# Patient Record
Sex: Male | Born: 1945 | Race: White | Hispanic: No | Marital: Married | Smoking: Former smoker
Health system: Southern US, Community
[De-identification: ages and names within clinical notes are randomized; demographics above are authoritative.]

## PROBLEM LIST (undated history)

## (undated) DIAGNOSIS — G4730 Sleep apnea, unspecified: Secondary | ICD-10-CM

## (undated) DIAGNOSIS — R0600 Dyspnea, unspecified: Secondary | ICD-10-CM

## (undated) DIAGNOSIS — M5416 Radiculopathy, lumbar region: Secondary | ICD-10-CM

## (undated) DIAGNOSIS — Z9889 Other specified postprocedural states: Secondary | ICD-10-CM

## (undated) DIAGNOSIS — E785 Hyperlipidemia, unspecified: Secondary | ICD-10-CM

## (undated) DIAGNOSIS — I10 Essential (primary) hypertension: Secondary | ICD-10-CM

## (undated) DIAGNOSIS — K219 Gastro-esophageal reflux disease without esophagitis: Secondary | ICD-10-CM

## (undated) DIAGNOSIS — J449 Chronic obstructive pulmonary disease, unspecified: Secondary | ICD-10-CM

## (undated) DIAGNOSIS — M48061 Spinal stenosis, lumbar region without neurogenic claudication: Secondary | ICD-10-CM

## (undated) DIAGNOSIS — T8859XA Other complications of anesthesia, initial encounter: Secondary | ICD-10-CM

## (undated) DIAGNOSIS — N1831 Chronic kidney disease, stage 3a: Secondary | ICD-10-CM

## (undated) DIAGNOSIS — E119 Type 2 diabetes mellitus without complications: Secondary | ICD-10-CM

## (undated) DIAGNOSIS — H409 Unspecified glaucoma: Secondary | ICD-10-CM

## (undated) DIAGNOSIS — C4492 Squamous cell carcinoma of skin, unspecified: Secondary | ICD-10-CM

## (undated) DIAGNOSIS — J189 Pneumonia, unspecified organism: Secondary | ICD-10-CM

## (undated) DIAGNOSIS — N183 Chronic kidney disease, stage 3 unspecified: Secondary | ICD-10-CM

## (undated) DIAGNOSIS — G44009 Cluster headache syndrome, unspecified, not intractable: Secondary | ICD-10-CM

## (undated) DIAGNOSIS — E538 Deficiency of other specified B group vitamins: Secondary | ICD-10-CM

## (undated) HISTORY — PX: CATARACT EXTRACTION: SUR2

## (undated) HISTORY — PX: COLONOSCOPY: SHX174

## (undated) HISTORY — PX: NASAL DERMOID CYST EXCISION: SHX2063

## (undated) HISTORY — PX: PILONIDAL CYST EXCISION: SHX744

## (undated) HISTORY — PX: APPENDECTOMY: SHX54

## (undated) HISTORY — PX: NASAL SEPTUM SURGERY: SHX37

## (undated) HISTORY — PX: BRONCHOSCOPY: SUR163

## (undated) HISTORY — PX: LUMBAR LAMINECTOMY: SHX95

## (undated) HISTORY — PX: BACK SURGERY: SHX140

---

## 1965-05-29 HISTORY — PX: APPENDECTOMY: SHX54

## 1981-05-29 HISTORY — PX: PILONIDAL CYST EXCISION: SHX744

## 2019-05-30 HISTORY — PX: CATARACT EXTRACTION: SUR2

## 2021-08-03 ENCOUNTER — Other Ambulatory Visit: Payer: Self-pay | Admitting: Internal Medicine

## 2021-08-03 DIAGNOSIS — R911 Solitary pulmonary nodule: Secondary | ICD-10-CM

## 2021-08-03 DIAGNOSIS — J431 Panlobular emphysema: Secondary | ICD-10-CM

## 2021-08-03 DIAGNOSIS — N401 Enlarged prostate with lower urinary tract symptoms: Secondary | ICD-10-CM | POA: Insufficient documentation

## 2021-11-01 ENCOUNTER — Other Ambulatory Visit: Payer: Self-pay

## 2021-11-01 ENCOUNTER — Inpatient Hospital Stay
Admission: EM | Admit: 2021-11-01 | Discharge: 2021-11-05 | DRG: 190 | Disposition: A | Discharge status: Home / Self Care | Payer: Medicare PPO | Attending: Family Medicine | Admitting: Family Medicine

## 2021-11-01 ENCOUNTER — Emergency Department: Payer: Medicare PPO

## 2021-11-01 DIAGNOSIS — F17210 Nicotine dependence, cigarettes, uncomplicated: Secondary | ICD-10-CM | POA: Diagnosis present

## 2021-11-01 DIAGNOSIS — Z20822 Contact with and (suspected) exposure to covid-19: Secondary | ICD-10-CM | POA: Diagnosis present

## 2021-11-01 DIAGNOSIS — E119 Type 2 diabetes mellitus without complications: Secondary | ICD-10-CM

## 2021-11-01 DIAGNOSIS — N1831 Chronic kidney disease, stage 3a: Secondary | ICD-10-CM | POA: Diagnosis present

## 2021-11-01 DIAGNOSIS — E1122 Type 2 diabetes mellitus with diabetic chronic kidney disease: Secondary | ICD-10-CM | POA: Diagnosis present

## 2021-11-01 DIAGNOSIS — E538 Deficiency of other specified B group vitamins: Secondary | ICD-10-CM | POA: Diagnosis present

## 2021-11-01 DIAGNOSIS — R634 Abnormal weight loss: Secondary | ICD-10-CM | POA: Diagnosis present

## 2021-11-01 DIAGNOSIS — J432 Centrilobular emphysema: Secondary | ICD-10-CM | POA: Diagnosis not present

## 2021-11-01 DIAGNOSIS — R911 Solitary pulmonary nodule: Secondary | ICD-10-CM | POA: Diagnosis present

## 2021-11-01 DIAGNOSIS — I10 Essential (primary) hypertension: Secondary | ICD-10-CM | POA: Diagnosis present

## 2021-11-01 DIAGNOSIS — N179 Acute kidney failure, unspecified: Secondary | ICD-10-CM | POA: Diagnosis present

## 2021-11-01 DIAGNOSIS — I129 Hypertensive chronic kidney disease with stage 1 through stage 4 chronic kidney disease, or unspecified chronic kidney disease: Secondary | ICD-10-CM | POA: Diagnosis present

## 2021-11-01 DIAGNOSIS — Z6825 Body mass index (BMI) 25.0-25.9, adult: Secondary | ICD-10-CM

## 2021-11-01 DIAGNOSIS — Z9049 Acquired absence of other specified parts of digestive tract: Secondary | ICD-10-CM

## 2021-11-01 DIAGNOSIS — K5900 Constipation, unspecified: Secondary | ICD-10-CM | POA: Diagnosis not present

## 2021-11-01 DIAGNOSIS — R0602 Shortness of breath: Secondary | ICD-10-CM | POA: Diagnosis present

## 2021-11-01 DIAGNOSIS — Z888 Allergy status to other drugs, medicaments and biological substances status: Secondary | ICD-10-CM

## 2021-11-01 DIAGNOSIS — J9601 Acute respiratory failure with hypoxia: Secondary | ICD-10-CM | POA: Diagnosis present

## 2021-11-01 DIAGNOSIS — J441 Chronic obstructive pulmonary disease with (acute) exacerbation: Secondary | ICD-10-CM | POA: Diagnosis present

## 2021-11-01 DIAGNOSIS — E785 Hyperlipidemia, unspecified: Secondary | ICD-10-CM | POA: Diagnosis present

## 2021-11-01 HISTORY — DX: Essential (primary) hypertension: I10

## 2021-11-01 HISTORY — DX: Hyperlipidemia, unspecified: E78.5

## 2021-11-01 HISTORY — DX: Chronic obstructive pulmonary disease, unspecified: J44.9

## 2021-11-01 LAB — BASIC METABOLIC PANEL
Anion gap: 9 (ref 5–15)
BUN: 24 mg/dL — ABNORMAL HIGH (ref 8–23)
CO2: 30 mmol/L (ref 22–32)
Calcium: 9.1 mg/dL (ref 8.9–10.3)
Chloride: 102 mmol/L (ref 98–111)
Creatinine, Ser: 1.31 mg/dL — ABNORMAL HIGH (ref 0.61–1.24)
GFR, Estimated: 56 mL/min — ABNORMAL LOW (ref >60)
Glucose, Bld: 100 mg/dL — ABNORMAL HIGH (ref 70–99)
Potassium: 3.2 mmol/L — ABNORMAL LOW (ref 3.5–5.1)
Sodium: 141 mmol/L (ref 135–145)

## 2021-11-01 LAB — CBC
HCT: 46.2 % (ref 39.0–52.0)
Hemoglobin: 15.6 g/dL (ref 13.0–17.0)
MCH: 30.5 pg (ref 26.0–34.0)
MCHC: 33.8 g/dL (ref 30.0–36.0)
MCV: 90.4 fL (ref 80.0–100.0)
Platelets: 185 10*3/uL (ref 150–400)
RBC: 5.11 MIL/uL (ref 4.22–5.81)
RDW: 14.8 % (ref 11.5–15.5)
WBC: 8.3 10*3/uL (ref 4.0–10.5)
nRBC: 0 % (ref 0.0–0.2)

## 2021-11-01 LAB — GLUCOSE, CAPILLARY
Glucose-Capillary: 296 mg/dL — ABNORMAL HIGH (ref 70–99)
Glucose-Capillary: 123 mg/dL — ABNORMAL HIGH (ref 70–99)

## 2021-11-01 LAB — BRAIN NATRIURETIC PEPTIDE: B Natriuretic Peptide: 53 pg/mL (ref 0.0–100.0)

## 2021-11-01 LAB — TROPONIN I (HIGH SENSITIVITY): Troponin I (High Sensitivity): 6 ng/L (ref <18)

## 2021-11-01 LAB — RESP PANEL BY RT-PCR (FLU A&B, COVID) ARPGX2
Influenza A by PCR: NEGATIVE
Influenza B by PCR: NEGATIVE
SARS Coronavirus 2 by RT PCR: NEGATIVE

## 2021-11-01 LAB — D-DIMER, QUANTITATIVE: D-Dimer, Quant: 0.48 ug/mL-FEU (ref 0.00–0.50)

## 2021-11-01 LAB — PROCALCITONIN: Procalcitonin: <0.1 ng/mL

## 2021-11-01 MED ORDER — SIMVASTATIN 10 MG PO TABS
40.0000 mg | ORAL_TABLET | Freq: Every day | ORAL | Status: DC
  Administered 2021-11-01 – 2021-11-04 (×4): 40 mg via ORAL
  Filled 2021-11-01 (×5): qty 4

## 2021-11-01 MED ORDER — AZITHROMYCIN 500 MG PO TABS
500.0000 mg | ORAL_TABLET | Freq: Once | ORAL | Status: AC
  Administered 2021-11-01: 500 mg via ORAL
  Filled 2021-11-01: qty 1

## 2021-11-01 MED ORDER — UMECLIDINIUM BROMIDE 62.5 MCG/ACT IN AEPB
1.0000 | INHALATION_SPRAY | Freq: Every day | RESPIRATORY_TRACT | Status: DC
  Administered 2021-11-02 – 2021-11-05 (×4): 1 via RESPIRATORY_TRACT
  Filled 2021-11-01: qty 7

## 2021-11-01 MED ORDER — METHYLPREDNISOLONE SODIUM SUCC 125 MG IJ SOLR
125.0000 mg | INTRAMUSCULAR | Status: AC
  Administered 2021-11-01: 125 mg via INTRAVENOUS
  Filled 2021-11-01: qty 2

## 2021-11-01 MED ORDER — VERAPAMIL HCL ER 240 MG PO TBCR
240.0000 mg | EXTENDED_RELEASE_TABLET | Freq: Every day | ORAL | Status: DC
  Administered 2021-11-01 – 2021-11-05 (×5): 240 mg via ORAL
  Filled 2021-11-01 (×5): qty 1

## 2021-11-01 MED ORDER — ALFUZOSIN HCL ER 10 MG PO TB24
10.0000 mg | ORAL_TABLET | Freq: Every day | ORAL | Status: DC
  Administered 2021-11-01 – 2021-11-05 (×5): 10 mg via ORAL
  Filled 2021-11-01 (×5): qty 1

## 2021-11-01 MED ORDER — HYDRALAZINE HCL 10 MG PO TABS: 10.0000 mg | ORAL_TABLET | Freq: Four times a day (QID) | ORAL | Status: DC | PRN

## 2021-11-01 MED ORDER — LATANOPROST 0.005 % OP SOLN
1.0000 [drp] | Freq: Every day | OPHTHALMIC | Status: DC
  Administered 2021-11-01 – 2021-11-04 (×4): 1 [drp] via OPHTHALMIC
  Filled 2021-11-01: qty 2.5

## 2021-11-01 MED ORDER — IPRATROPIUM-ALBUTEROL 0.5-2.5 (3) MG/3ML IN SOLN
3.0000 mL | Freq: Three times a day (TID) | RESPIRATORY_TRACT | Status: AC
  Administered 2021-11-01 – 2021-11-03 (×7): 3 mL via RESPIRATORY_TRACT
  Filled 2021-11-01 (×7): qty 3

## 2021-11-01 MED ORDER — MELATONIN 5 MG PO TABS
10.0000 mg | ORAL_TABLET | Freq: Every evening | ORAL | Status: DC | PRN
  Administered 2021-11-01 – 2021-11-04 (×3): 10 mg via ORAL
  Filled 2021-11-01 (×3): qty 2

## 2021-11-01 MED ORDER — INSULIN ASPART 100 UNIT/ML IJ SOLN
0.0000 [IU] | Freq: Three times a day (TID) | INTRAMUSCULAR | Status: DC
  Administered 2021-11-01: 11 [IU] via SUBCUTANEOUS
  Administered 2021-11-02: 4 [IU] via SUBCUTANEOUS
  Administered 2021-11-03 – 2021-11-04 (×3): 3 [IU] via SUBCUTANEOUS
  Filled 2021-11-01 (×5): qty 1

## 2021-11-01 MED ORDER — METHYLPREDNISOLONE SODIUM SUCC 40 MG IJ SOLR
40.0000 mg | Freq: Two times a day (BID) | INTRAMUSCULAR | Status: AC
  Administered 2021-11-02 (×2): 40 mg via INTRAVENOUS
  Filled 2021-11-01 (×2): qty 1

## 2021-11-01 MED ORDER — SENNOSIDES-DOCUSATE SODIUM 8.6-50 MG PO TABS: 1.0000 | ORAL_TABLET | Freq: Two times a day (BID) | ORAL | Status: DC | PRN

## 2021-11-01 MED ORDER — IPRATROPIUM-ALBUTEROL 0.5-2.5 (3) MG/3ML IN SOLN
3.0000 mL | Freq: Once | RESPIRATORY_TRACT | Status: AC
  Administered 2021-11-01: 3 mL via RESPIRATORY_TRACT
  Filled 2021-11-01: qty 3

## 2021-11-01 MED ORDER — POLYETHYLENE GLYCOL 3350 17 G PO PACK
17.0000 g | PACK | Freq: Every day | ORAL | Status: DC | PRN
  Administered 2021-11-03 – 2021-11-04 (×2): 17 g via ORAL
  Filled 2021-11-01 (×2): qty 1

## 2021-11-01 MED ORDER — ENOXAPARIN SODIUM 40 MG/0.4ML IJ SOSY
40.0000 mg | PREFILLED_SYRINGE | Freq: Every day | INTRAMUSCULAR | Status: DC
  Administered 2021-11-01 – 2021-11-04 (×4): 40 mg via SUBCUTANEOUS
  Filled 2021-11-01 (×4): qty 0.4

## 2021-11-01 MED ORDER — ALBUTEROL SULFATE HFA 108 (90 BASE) MCG/ACT IN AERS: 2.0000 | INHALATION_SPRAY | RESPIRATORY_TRACT | Status: DC | PRN

## 2021-11-01 MED ORDER — ALBUTEROL SULFATE (2.5 MG/3ML) 0.083% IN NEBU
7.5000 mg/h | INHALATION_SOLUTION | RESPIRATORY_TRACT | Status: AC
  Administered 2021-11-01: 7.5 mg/h via RESPIRATORY_TRACT
  Filled 2021-11-01: qty 9

## 2021-11-01 MED ORDER — ONDANSETRON HCL 4 MG/2ML IJ SOLN: 4.0000 mg | Freq: Four times a day (QID) | INTRAMUSCULAR | Status: DC | PRN

## 2021-11-01 MED ORDER — SODIUM CHLORIDE 0.9 % IV SOLN
500.0000 mg | INTRAVENOUS | Status: AC
  Administered 2021-11-02 – 2021-11-03 (×2): 500 mg via INTRAVENOUS
  Filled 2021-11-01 (×2): qty 500

## 2021-11-01 MED ORDER — IPRATROPIUM-ALBUTEROL 0.5-2.5 (3) MG/3ML IN SOLN
3.0000 mL | Freq: Three times a day (TID) | RESPIRATORY_TRACT | Status: DC
Start: 2021-11-01 — End: 2021-11-01

## 2021-11-01 MED ORDER — ALBUTEROL SULFATE (2.5 MG/3ML) 0.083% IN NEBU
2.5000 mg | INHALATION_SOLUTION | Freq: Four times a day (QID) | RESPIRATORY_TRACT | Status: DC
Start: 2021-11-01 — End: 2021-11-01

## 2021-11-01 MED ORDER — FLUTICASONE FUROATE-VILANTEROL 100-25 MCG/ACT IN AEPB
1.0000 | INHALATION_SPRAY | Freq: Every day | RESPIRATORY_TRACT | Status: DC
  Administered 2021-11-02 – 2021-11-05 (×4): 1 via RESPIRATORY_TRACT
  Filled 2021-11-01: qty 28

## 2021-11-01 MED ORDER — IRBESARTAN 150 MG PO TABS
150.0000 mg | ORAL_TABLET | Freq: Every day | ORAL | Status: DC
  Administered 2021-11-01 – 2021-11-05 (×5): 150 mg via ORAL
  Filled 2021-11-01 (×5): qty 1

## 2021-11-01 MED ORDER — PREDNISONE 20 MG PO TABS
40.0000 mg | ORAL_TABLET | Freq: Every day | ORAL | Status: DC
  Administered 2021-11-03 – 2021-11-05 (×3): 40 mg via ORAL
  Filled 2021-11-01 (×3): qty 2

## 2021-11-01 MED ORDER — ONDANSETRON HCL 4 MG PO TABS: 4.0000 mg | ORAL_TABLET | Freq: Four times a day (QID) | ORAL | Status: DC | PRN

## 2021-11-01 MED ORDER — ACETAMINOPHEN 650 MG RE SUPP: 650.0000 mg | Freq: Four times a day (QID) | RECTAL | Status: DC | PRN

## 2021-11-01 MED ORDER — DUTASTERIDE 0.5 MG PO CAPS
0.5000 mg | ORAL_CAPSULE | Freq: Every day | ORAL | Status: DC
  Administered 2021-11-01 – 2021-11-05 (×5): 0.5 mg via ORAL
  Filled 2021-11-01 (×5): qty 1

## 2021-11-01 MED ORDER — ACETAMINOPHEN 325 MG PO TABS: 650.0000 mg | ORAL_TABLET | Freq: Four times a day (QID) | ORAL | Status: DC | PRN

## 2021-11-01 MED ORDER — INSULIN ASPART 100 UNIT/ML IJ SOLN: 0.0000 [IU] | Freq: Every day | INTRAMUSCULAR | Status: DC

## 2021-11-01 MED ORDER — POTASSIUM CHLORIDE CRYS ER 20 MEQ PO TBCR
40.0000 meq | EXTENDED_RELEASE_TABLET | Freq: Once | ORAL | Status: AC
  Administered 2021-11-01: 40 meq via ORAL
  Filled 2021-11-01: qty 2

## 2021-11-01 MED ORDER — ASPIRIN 81 MG PO CHEW
81.0000 mg | CHEWABLE_TABLET | Freq: Every day | ORAL | Status: DC
  Administered 2021-11-01 – 2021-11-05 (×5): 81 mg via ORAL
  Filled 2021-11-01 (×5): qty 1

## 2021-11-01 NOTE — ED Triage Notes (Signed)
First Nurse Note:  C/O Cough, sob x 1 day.  States laying down makes symptoms worse.   Patient is AAOx3.  Skin warm and dry. No SOB/ DOE.  Non productive cough observed.

## 2021-11-01 NOTE — Assessment & Plan Note (Signed)
-   A1c on 08/03/2021 was 5.8 - Insulin SSI with at bedtime coverage ordered for steroid dosing - Goal inpatient blood glucose levels 140-180

## 2021-11-01 NOTE — Assessment & Plan Note (Addendum)
-   Resumed home verapamil 240 mg daily, olmesartan 20 mg/irbesartan 150 mg daily - Hydralazine 10 mg p.o. every 6 hours as needed for SBP greater than 170, 5 days ordered

## 2021-11-01 NOTE — ED Triage Notes (Signed)
Pt c/o SOB over the past 24 hrs , pt has a hx of COPD and feels like this is a flare up, did a albuterol neb treatment this morning with no relief.

## 2021-11-01 NOTE — Assessment & Plan Note (Addendum)
-   Simvastatin 40 mg daily resumed 

## 2021-11-01 NOTE — ED Provider Notes (Signed)
[facility] Provider Note   Event Date/Time   First MD Initiated Contact with Patient 11/01/21 1030     (approximate)  History   Shortness of Breath  HPI  Bryan Mcclain is a 76 y.o. male who on review of primary care note by Dr. Hyacinth Meeker from March has a history of COPD, diabetes hypertension hyperlipidemia   Patient reports has been having increased shortness of breath wheezing for about the last 1 to 2 days  No fever or chest pain.  Feels like a flareup of COPD.  Does use albuterol treatment but did not help today  No nausea or vomiting.  No fever.  Does not have any sort of congested cough.  Feels improved on oxygen  Physical Exam   Triage Vital Signs: ED Triage Vitals  Enc Vitals Group     BP 11/01/21 1019 (!) 158/84     Pulse Rate 11/01/21 1019 78     Resp 11/01/21 1019 (!) 24     Temp 11/01/21 1019 97.7 F (36.5 C)     Temp Source 11/01/21 1019 Oral     SpO2 11/01/21 1019 (!) 86 %     Weight 11/01/21 1020 180 lb (81.6 kg)     Height 11/01/21 1020 5\' 11"  (1.803 m)     Head Circumference --      Peak Flow --      Pain Score 11/01/21 1020 0     Pain Loc --      Pain Edu? --      Excl. in GC? --     Most recent vital signs: Vitals:   11/01/21 1215 11/01/21 1230  BP: 139/89 (!) 156/85  Pulse: 80 84  Resp: 18 17  Temp:    SpO2: 97% 97%     General: Awake, moderate increased work of breathing accessory muscle use.  CV:  Good peripheral perfusion.  Normal heart tones Resp:  No tripoding or severe extremis but moderate increased work of breathing accessory muscle use and pursed lip breathing.  Notable expiratory wheezing and diminished lung sounds in the bases bilaterally.  Speaks in short phrases.  3 L oxygen requirement Abd:  No distention.  Other:  No lower extremity edema or venous cords or congestion   ED Results / Procedures / Treatments   Labs (all labs ordered are listed, but only abnormal results are displayed) Labs Reviewed   BASIC METABOLIC PANEL - Abnormal; Notable for the following components:      Result Value   Potassium 3.2 (*)    Glucose, Bld 100 (*)    BUN 24 (*)    Creatinine, Ser 1.31 (*)    GFR, Estimated 56 (*)    All other components within normal limits  RESP PANEL BY RT-PCR (FLU A&B, COVID) ARPGX2  CBC  D-DIMER, QUANTITATIVE  BRAIN NATRIURETIC PEPTIDE  TROPONIN I (HIGH SENSITIVITY)     EKG  Interpreted by me at 1020 Heart rate 70 QRS 100 QTc 420 Probable normal sinus rhythm, mild nonspecific T wave abnormality.  No evidence of frank acute ischemia   RADIOLOGY  Chest x-ray interpreted by me, negative for acute finding.  No pneumothorax or infiltrate  D-dimer    PROCEDURES:  Critical Care performed: Yes  CRITICAL CARE Performed by: 01/01/22   Total critical care time: 25 minutes  Critical care time was exclusive of separately billable procedures and treating other patients.  Critical care was necessary to treat or prevent imminent or life-threatening  deterioration.  Critical care was time spent personally by me on the following activities: development of treatment plan with patient and/or surrogate as well as nursing, discussions with consultants, evaluation of patient's response to treatment, examination of patient, obtaining history from patient or surrogate, ordering and performing treatments and interventions, ordering and review of laboratory studies, ordering and review of radiographic studies, pulse oximetry and re-evaluation of patient's condition.   Procedures   MEDICATIONS ORDERED IN ED: Medications  albuterol (VENTOLIN HFA) 108 (90 Base) MCG/ACT inhaler 2 puff (has no administration in time range)  albuterol (PROVENTIL) (2.5 MG/3ML) 0.083% nebulizer solution (has no administration in time range)  azithromycin (ZITHROMAX) tablet 500 mg (has no administration in time range)  potassium chloride SA (KLOR-CON M) CR tablet 40 mEq (has no administration in time  range)  acetaminophen (TYLENOL) tablet 650 mg (has no administration in time range)    Or  acetaminophen (TYLENOL) suppository 650 mg (has no administration in time range)  ondansetron (ZOFRAN) tablet 4 mg (has no administration in time range)    Or  ondansetron (ZOFRAN) injection 4 mg (has no administration in time range)  methylPREDNISolone sodium succinate (SOLU-MEDROL) 40 mg/mL injection 40 mg (has no administration in time range)    Followed by  predniSONE (DELTASONE) tablet 40 mg (has no administration in time range)  enoxaparin (LOVENOX) injection 40 mg (has no administration in time range)  senna-docusate (Senokot-S) tablet 1 tablet (has no administration in time range)  polyethylene glycol (MIRALAX / GLYCOLAX) packet 17 g (has no administration in time range)  ipratropium-albuterol (DUONEB) 0.5-2.5 (3) MG/3ML nebulizer solution 3 mL (3 mLs Nebulization Given 11/01/21 1056)  ipratropium-albuterol (DUONEB) 0.5-2.5 (3) MG/3ML nebulizer solution 3 mL (3 mLs Nebulization Given 11/01/21 1056)  methylPREDNISolone sodium succinate (SOLU-MEDROL) 125 mg/2 mL injection 125 mg (125 mg Intravenous Given 11/01/21 1054)     IMPRESSION / MDM / ASSESSMENT AND PLAN / ED COURSE  I reviewed the triage vital signs and the nursing notes.                              Differential diagnosis includes, but is not limited to, COPD exacerbation pneumonia pneumothorax ACS, PE, pulmonary fibrosis etc.  Patient's presentation is most consistent with acute presentation with potential threat to life or bodily function.  The patient is on the cardiac monitor to evaluate for evidence of arrhythmia and/or significant heart rate changes.    Low risk for PE by Wells criteria.  D-dimer exclusionary.  Labs interpreted as normal CBC.  Mild hypokalemia.  Negative COVID ----------------------------------------- 1:22 PM on 11/01/2021 ----------------------------------------- Patient resting, still 2 L oxygen requirement  to maintain sats in the mid 90s.  She has occasional pursed lip breathing and extended expiratory phase.  Improvement from arrival, but still evidence of ongoing COPD like exacerbation.  We will give additional continuous albuterol at this time  Discussed with patient and wife both understanding agreeable with plan for admission.  I consulted with the hospitalist, after discussion with Dr. Zenovia Jordan. Patient will be admitted for ongoing care and treatment  FINAL CLINICAL IMPRESSION(S) / ED DIAGNOSES   Final diagnoses:  COPD exacerbation (HCC)     Rx / DC Orders   ED Discharge Orders     None        Note:  This document was prepared using Dragon voice recognition software and may include unintentional dictation errors.   Sharyn Creamer, MD 11/01/21 1345

## 2021-11-01 NOTE — Assessment & Plan Note (Addendum)
-   Current presumptive diagnosis is COPD exacerbation - However as patient endorses shortness of breath and cough is worse with laying down, we will check BNP and high-sensitivity troponin - At this time we will continue to treat for COPD exacerbation pending BNP and high sensitive troponin results - BNP and high sensitive troponin were negative, procalcitonin was negative

## 2021-11-01 NOTE — Hospital Course (Signed)
Mr. Jovonni Borquez is a 76 year old male with history of COPD, hyperlipidemia, hypertension, B12 deficiency, not insulin-dependent diabetes mellitus, CKD 3A, who presents emergency department for chief concerns of shortness of breath for 1 to 2 days.  Initial vitals in the emergency department showed temperature of 97.7, respiration rate of 20, heart rate of 74, blood pressure 134/87, SPO2 of 96% on room air.  Serum sodium is 141, potassium 3.2, chloride of 102, bicarb of 30, BUN of 24, serum creatinine of 1.31, nonfasting blood glucose 100, WBC 8.3, hemoglobin 15.6, platelets of 185.  D-dimer was 0.48.  COVID/influenza A/influenza B PCR were negative  ED treatment: DuoNeb's x2, Solu-Medrol 125 mg IV one-time dose, potassium chloride 40 mill), azithromycin 500 mg IV, and patient was started on continuous albuterol nebulizer.

## 2021-11-01 NOTE — H&P (Signed)
History and Physical   Bryan Mcclain [id number] DOB: Mar 26, 1946 DOA: 11/01/2021  PCP: Bryan Aus, MD  Patient coming from: Home  I have personally briefly reviewed patient's old medical records in [place].  Chief Concern: Shortness of breath  HPI: Mr. Bryan Mcclain is a 76 year old male with history of COPD, hyperlipidemia, hypertension, B12 deficiency, not insulin-dependent diabetes mellitus, CKD 3A, who presents emergency department for chief concerns of shortness of breath for 1 to 2 days.  Initial vitals in the emergency department showed temperature of 97.7, respiration rate of 20, heart rate of 74, blood pressure 134/87, SPO2 of 96% on room air.  Serum sodium is 141, potassium 3.2, chloride of 102, bicarb of 30, BUN of 24, serum creatinine of 1.31, nonfasting blood glucose 100, WBC 8.3, hemoglobin 15.6, platelets of 185.  D-dimer was 0.48.  COVID/influenza A/influenza B PCR were negative  ED treatment: DuoNeb's x2, Solu-Medrol 125 mg IV one-time dose, potassium chloride 40 mill), azithromycin 500 mg IV, and patient was started on continuous albuterol nebulizer. ------------- At bedside patient is able to tell me his name, his age, he knows he is in the hospital and is able to identify his wife Bryan Mcclain at bedside.  He reports that he has been having shortness of breath over the last 2 to 3 days.  He reports that at baseline he does not require CO2 supplementation.  He denies known sick contacts.  He reports a cough that is occasionally productive of white sputum/white phlegm.  He reports that shortness of breath is worse when he lays flat and when he walks.  He denies any chest pain, abdominal pain, double vision, fever, dysphagia, dysuria, hematuria, diarrhea, swelling in his lower extremities.  He denies any trauma to his person.  Spouse endorses that over the last 1 to 2 years patient has unintentionally lost about 20 to 30 pounds.  Family and patient further endorses  that his PCP is aware of the unintentional weight loss.  Social history: He lives at home with his wife Bryan Mcclain.  He is a current tobacco user, smoking about half a pack per day.  He has been smoking for at least 50 years.  He is not ready to quit.  He denies EtOH and recreational drug use.  He is retired and formerly worked as a Education officer, museum and a principal.  ROS: Constitutional: no weight change, no fever ENT/Mouth: no sore throat, no rhinorrhea Eyes: no eye pain, no vision changes Cardiovascular: no chest pain, + dyspnea,  no edema, no palpitations Respiratory: + cough, + sputum, no wheezing Gastrointestinal: no nausea, no vomiting, no diarrhea, no constipation Genitourinary: no urinary incontinence, no dysuria, no hematuria Musculoskeletal: no arthralgias, no myalgias Skin: no skin lesions, no pruritus, Neuro: + weakness, no loss of consciousness, no syncope Psych: no anxiety, no depression, + decrease appetite Heme/Lymph: no bruising, no bleeding  ED Course: Discussed with emergency medicine provider, patient requiring hospitalization for chief concerns of COPD exacerbation now requiring 2 L nasal cannula.  Assessment/Plan  Principal Problem:   Shortness of breath Active Problems:   Essential hypertension   Hyperlipidemia   Diabetes mellitus type 2, noninsulin dependent (HCC)   Stage 3a chronic kidney disease (CKD) (HCC)   COPD with acute exacerbation (HCC)   Assessment and Plan:  * Shortness of breath - Current presumptive diagnosis is COPD exacerbation - However as patient endorses shortness of breath and cough is worse with laying down, we will check BNP and high-sensitivity troponin -  At this time we will continue to treat for COPD exacerbation pending BNP and high sensitive troponin results - BNP and high sensitive troponin were negative, procalcitonin was negative  COPD with acute exacerbation ([place]) - Per PCP note, patient has trilogy Ellipta inhalation once daily  at home - No prior oxygen supplementation requirement - Continue 2 L nasal cannula - Status post DuoNebs x2, Solu-Medrol 125 mg IV per EDP, one-time dose of continuous albuterol nebulizer, and azithromycin 500 mg IV one-time dose - Solu-Medrol 40 mg IV twice daily with taper to prednisone p.o. - DuoNebs scheduled 3 times daily, 4 doses ordered - Admit to telemetry medical, observation  Stage 3a chronic kidney disease (CKD) ([place]) - At baseline  Diabetes mellitus type 2, noninsulin dependent ([place]) - A1c on 08/03/2021 was 5.8 - Insulin SSI with at bedtime coverage ordered for steroid dosing - Goal inpatient blood glucose levels 140-180  Hyperlipidemia - Per PCP note, simvastatin 40 mg daily  Essential hypertension - Per PCP note on 08/03/2021: Patient takes verapamil to 40 mg daily, olmesartan 20 mg daily - Hydralazine 10 mg p.o. every 6 hours as needed for SBP greater than 170, 5 days ordered  Chart reviewed.   DVT prophylaxis: Enoxaparin Code Status: Full code Diet: Heart healthy/carb modified Family Communication: Updated spouse at bedside with patient's permission Disposition Plan: Pending clinical course Consults called: None at this time Admission status: Telemetry medical, observation  Past Medical History:  Diagnosis Date   COPD (chronic obstructive pulmonary disease) ([place])    Hyperlipidemia    Hypertension    Past Surgical History:  Procedure Laterality Date   APPENDECTOMY     BACK SURGERY     CATARACT EXTRACTION     NASAL SEPTUM SURGERY     Social History:  reports that he has been smoking cigarettes. He has never used smokeless tobacco. He reports that he does not currently use alcohol. He reports that he does not currently use drugs.  Allergies  Allergen Reactions   Moxifloxacin Shortness Of Breath   History reviewed. No pertinent family history. Family history: Family history reviewed and not pertinent  Prior to Admission medications   Not on File    Physical Exam: Vitals:   11/01/21 1059 11/01/21 1100 11/01/21 1215 11/01/21 1230  BP:  134/87 139/89 (!) 156/85  Pulse: 74 74 80 84  Resp:  20 18 17   Temp:      TempSrc:      SpO2: 97% 96% 97% 97%  Weight:      Height:       Constitutional: appears age-appropriate, NAD, calm, comfortable Eyes: PERRL, lids and conjunctivae normal ENMT: Mucous membranes are moist. Posterior pharynx clear of any exudate or lesions. Age-appropriate dentition. Hearing appropriate Neck: normal, supple, no masses, no thyromegaly Respiratory: General bilateral decreased lung sounds on auscultation, + diffuse wheezing on exhalation, no crackles. Normal respiratory effort. No accessory muscle use.  Cardiovascular: Regular rate and rhythm, no murmurs / rubs / gallops. No extremity edema. 2+ pedal pulses. No carotid bruits.  Abdomen: no tenderness, no masses palpated, no hepatosplenomegaly. Bowel sounds positive.  Musculoskeletal: no clubbing / cyanosis. No joint deformity upper and lower extremities. Good ROM, no contractures, no atrophy. Normal muscle tone.  Skin: no rashes, lesions, ulcers. No induration Neurologic: Sensation intact. Strength 5/5 in all 4.  Psychiatric: Normal judgment and insight. Alert and oriented x 3. Normal mood.   EKG: independently reviewed, showing sinus rhythm with rate of 72, QTc 418  Chest x-ray  on Admission: I personally reviewed and I agree with radiologist reading as below.  DG Chest 2 View  Result Date: 11/01/2021 CLINICAL DATA:  Shortness of breath EXAM: CHEST - 2 VIEW COMPARISON:  None Available. FINDINGS: Background changes of COPD with likely chronic interstitial prominence. No consolidation or edema. No pleural effusion or pneumothorax. Normal heart size. IMPRESSION: COPD.  No acute process in the chest. Electronically Signed   By: Macy Mis M.D.   On: 11/01/2021 10:48    Labs on Admission: I have personally reviewed following labs  CBC: Recent Labs  Lab  11/01/21 1024  WBC 8.3  HGB 15.6  HCT 46.2  MCV 90.4  PLT [id number]   Basic Metabolic Panel: Recent Labs  Lab 11/01/21 1024  NA 141  K 3.2*  CL 102  CO2 30  GLUCOSE 100*  BUN 24*  CREATININE 1.31*  CALCIUM 9.1   GFR: Estimated Creatinine Clearance: 50.3 mL/min (A) (by C-G formula based on SCr of 1.31 mg/dL (H)).  Dr. Tobie Poet [facility]  If 7PM-7AM, please contact overnight-coverage provider If 7AM-7PM, please contact day coverage provider [email or web address]  11/01/2021, 2:35 PM

## 2021-11-01 NOTE — Assessment & Plan Note (Signed)
At baseline 

## 2021-11-01 NOTE — Assessment & Plan Note (Addendum)
-   Per PCP note, patient has trilogy Ellipta inhalation once daily at home - No prior oxygen supplementation requirement - Continue 2 L nasal cannula - Status post DuoNebs x2, Solu-Medrol 125 mg IV per EDP, one-time dose of continuous albuterol nebulizer, and azithromycin 500 mg IV one-time dose - Solu-Medrol 40 mg IV twice daily with taper to prednisone p.o. - DuoNebs scheduled 3 times daily, 4 doses ordered - Admit to telemetry medical, observation

## 2021-11-02 ENCOUNTER — Inpatient Hospital Stay: Payer: Medicare PPO

## 2021-11-02 DIAGNOSIS — I129 Hypertensive chronic kidney disease with stage 1 through stage 4 chronic kidney disease, or unspecified chronic kidney disease: Secondary | ICD-10-CM | POA: Diagnosis present

## 2021-11-02 DIAGNOSIS — E785 Hyperlipidemia, unspecified: Secondary | ICD-10-CM | POA: Diagnosis present

## 2021-11-02 DIAGNOSIS — N1831 Chronic kidney disease, stage 3a: Secondary | ICD-10-CM | POA: Diagnosis present

## 2021-11-02 DIAGNOSIS — R911 Solitary pulmonary nodule: Secondary | ICD-10-CM | POA: Diagnosis present

## 2021-11-02 DIAGNOSIS — Z9049 Acquired absence of other specified parts of digestive tract: Secondary | ICD-10-CM | POA: Diagnosis not present

## 2021-11-02 DIAGNOSIS — N179 Acute kidney failure, unspecified: Secondary | ICD-10-CM | POA: Diagnosis present

## 2021-11-02 DIAGNOSIS — F17210 Nicotine dependence, cigarettes, uncomplicated: Secondary | ICD-10-CM | POA: Diagnosis present

## 2021-11-02 DIAGNOSIS — J432 Centrilobular emphysema: Secondary | ICD-10-CM | POA: Diagnosis present

## 2021-11-02 DIAGNOSIS — R0602 Shortness of breath: Secondary | ICD-10-CM

## 2021-11-02 DIAGNOSIS — E538 Deficiency of other specified B group vitamins: Secondary | ICD-10-CM | POA: Diagnosis present

## 2021-11-02 DIAGNOSIS — Z6825 Body mass index (BMI) 25.0-25.9, adult: Secondary | ICD-10-CM | POA: Diagnosis not present

## 2021-11-02 DIAGNOSIS — R634 Abnormal weight loss: Secondary | ICD-10-CM | POA: Diagnosis present

## 2021-11-02 DIAGNOSIS — E1122 Type 2 diabetes mellitus with diabetic chronic kidney disease: Secondary | ICD-10-CM | POA: Diagnosis present

## 2021-11-02 DIAGNOSIS — Z20822 Contact with and (suspected) exposure to covid-19: Secondary | ICD-10-CM | POA: Diagnosis present

## 2021-11-02 DIAGNOSIS — Z888 Allergy status to other drugs, medicaments and biological substances status: Secondary | ICD-10-CM | POA: Diagnosis not present

## 2021-11-02 DIAGNOSIS — J9601 Acute respiratory failure with hypoxia: Secondary | ICD-10-CM | POA: Diagnosis present

## 2021-11-02 DIAGNOSIS — K5900 Constipation, unspecified: Secondary | ICD-10-CM | POA: Diagnosis not present

## 2021-11-02 LAB — CBC
HCT: 44.1 % (ref 39.0–52.0)
Hemoglobin: 15 g/dL (ref 13.0–17.0)
MCH: 30.1 pg (ref 26.0–34.0)
MCHC: 34 g/dL (ref 30.0–36.0)
MCV: 88.6 fL (ref 80.0–100.0)
Platelets: 187 10*3/uL (ref 150–400)
RBC: 4.98 MIL/uL (ref 4.22–5.81)
RDW: 14.7 % (ref 11.5–15.5)
WBC: 10.6 10*3/uL — ABNORMAL HIGH (ref 4.0–10.5)
nRBC: 0 % (ref 0.0–0.2)

## 2021-11-02 LAB — BASIC METABOLIC PANEL
Anion gap: 7 (ref 5–15)
BUN: 38 mg/dL — ABNORMAL HIGH (ref 8–23)
CO2: 28 mmol/L (ref 22–32)
Calcium: 9.1 mg/dL (ref 8.9–10.3)
Chloride: 104 mmol/L (ref 98–111)
Creatinine, Ser: 1.68 mg/dL — ABNORMAL HIGH (ref 0.61–1.24)
GFR, Estimated: 42 mL/min — ABNORMAL LOW (ref >60)
Glucose, Bld: 126 mg/dL — ABNORMAL HIGH (ref 70–99)
Potassium: 4.1 mmol/L (ref 3.5–5.1)
Sodium: 139 mmol/L (ref 135–145)

## 2021-11-02 LAB — GLUCOSE, CAPILLARY
Glucose-Capillary: 118 mg/dL — ABNORMAL HIGH (ref 70–99)
Glucose-Capillary: 93 mg/dL (ref 70–99)
Glucose-Capillary: 184 mg/dL — ABNORMAL HIGH (ref 70–99)
Glucose-Capillary: 126 mg/dL — ABNORMAL HIGH (ref 70–99)

## 2021-11-02 MED ORDER — IOHEXOL 300 MG/ML  SOLN
60.0000 mL | Freq: Once | INTRAMUSCULAR | Status: AC | PRN
  Administered 2021-11-02: 60 mL via INTRAVENOUS

## 2021-11-02 MED ORDER — STERILE WATER FOR INJECTION IJ SOLN
INTRAMUSCULAR | Status: AC
  Administered 2021-11-02: 10 mL
  Filled 2021-11-02: qty 10

## 2021-11-02 NOTE — Progress Notes (Signed)
PROGRESS NOTE    Bryan Mcclain  [id number] DOB: September 11, 1945 DOA: 11/01/2021  PCP: Rusty Aus, MD   Brief Narrative: This 76 years old male with history of COPD, hyperlipidemia, hypertension, B12 deficiency, non-insulin-dependent diabetes mellitus, CKD stage IIIa presented in the ED with complaints of shortness of breath for 1 to 2 days.  COVID and influenza -.  Patient was given DuoNeb x2, Solu-Medrol 125 mg once in the ED.  Patient does not use oxygen at baseline.  He reports occasional productive cough of white phlegm. Patient is admitted for acute hypoxic respiratory failure secondary to COPD with acute exacerbation.  Assessment & Plan:   Principal Problem:   Shortness of breath Active Problems:   Essential hypertension   Hyperlipidemia   Diabetes mellitus type 2, noninsulin dependent (HCC)   Stage 3a chronic kidney disease (CKD) (HCC)   COPD with acute exacerbation (HCC)  Acute hypoxic respiratory failure: Patient presented with hypoxia , SPO2 86% on room air , requiring 2 L of supplemental oxygen. Likely secondary to COPD exacerbation. BNP and troponin unremarkable.  Continue supplemental oxygen and wean as tolerated. Procalcitonin negative.  Continue Solu-Medrol, and nebulizer inhalers.  COPD with acute exacerbation: Continue supplemental oxygen and wean as tolerated. Continue Trelegy Ellipta inhalation once daily. Continue Solu-Medrol 40 mg IV q. twice daily, plan to transition to prednisone tomorrow. Given Zithromax 500 g IV once.  CKD stage IIIa: Serum creatinine at baseline.  Avoid nephrotoxic medications.  Type 2 diabetes: Hemoglobin A1c 3/23 was 5.8. Continue regular insulin sliding scale due to steroid dosing.  Hyperlipidemia: Continue simvastatin 40 mg daily  Essential hypertension: Continue verapamil 40 mg daily Olmesartan 20 mg daily,   DVT prophylaxis: Lovenox Code Status: Full code Family Communication: Wife at bedside Disposition Plan:    Status is: Inpatient Remains inpatient appropriate because: Admitted for COPD exacerbation requiring supplemental oxygen, Solu-Medrol and nebulized bronchodilators.  Anticipated discharge home in 1 to 2 days.    Consultants:  None  Procedures: None Antimicrobials: Anti-infectives (From admission, onward)    Start     Dose/Rate Route Frequency Ordered Stop   11/02/21 1000  azithromycin (ZITHROMAX) 500 mg in sodium chloride 0.9 % 250 mL IVPB        500 mg 250 mL/hr over 60 Minutes Intravenous Every 24 hours 11/01/21 1351 11/04/21 0959   11/01/21 1330  azithromycin (ZITHROMAX) tablet 500 mg        500 mg Oral  Once 11/01/21 1323 11/01/21 1400       Subjective: Patient was seen and examined at bedside.  Overnight events noted.  Patient reports feeling better. He still has significant wheezing noted on exam.  He remains on 4 L of supplemental oxygen.  Objective: Vitals:   11/01/21 2325 11/02/21 0441 11/02/21 0814 11/02/21 1158  BP: (!) 102/52 131/68 (!) 144/77 133/72  Pulse: 86 66 92 77  Resp: 18 18 18 18   Temp: 98.5 F (36.9 C)  98.1 F (36.7 C) 98.1 F (36.7 C)  TempSrc:      SpO2: 93% 91% 94% 91%  Weight:      Height:        Intake/Output Summary (Last 24 hours) at 11/02/2021 1201 Last data filed at 11/02/2021 1019 Gross per 24 hour  Intake 360 ml  Output --  Net 360 ml   Filed Weights   11/01/21 1020  Weight: 81.6 kg    Examination:  General exam: Appears comfortable, not in any acute distress.  Deconditioned Respiratory system: Wheezing  noted on exam, respiratory effort normal, no accessory muscle use. Cardiovascular system: S1-S2 heard, regular rate and rhythm, no murmur. Gastrointestinal system: Abdomen is soft, non tender, non distended, BS+. Central nervous system: Alert and oriented x 3. No focal neurological deficits. Extremities: No edema, no cyanosis, no clubbing. Skin: No rashes, lesions or ulcers Psychiatry: Judgement and insight appear normal.  Mood & affect appropriate.     Data Reviewed: I have personally reviewed following labs and imaging studies  CBC: Recent Labs  Lab 11/01/21 1024 11/02/21 0617  WBC 8.3 10.6*  HGB 15.6 15.0  HCT 46.2 44.1  MCV 90.4 88.6  PLT 185 [id number]   Basic Metabolic Panel: Recent Labs  Lab 11/01/21 1024 11/02/21 0617  NA 141 139  K 3.2* 4.1  CL 102 104  CO2 30 28  GLUCOSE 100* 126*  BUN 24* 38*  CREATININE 1.31* 1.68*  CALCIUM 9.1 9.1   GFR: Estimated Creatinine Clearance: 39.2 mL/min (A) (by C-G formula based on SCr of 1.68 mg/dL (H)). Liver Function Tests: No results for input(s): AST, ALT, ALKPHOS, BILITOT, PROT, ALBUMIN in the last 168 hours. No results for input(s): LIPASE, AMYLASE in the last 168 hours. No results for input(s): AMMONIA in the last 168 hours. Coagulation Profile: No results for input(s): INR, PROTIME in the last 168 hours. Cardiac Enzymes: No results for input(s): CKTOTAL, CKMB, CKMBINDEX, TROPONINI in the last 168 hours. BNP (last 3 results) No results for input(s): PROBNP in the last 8760 hours. HbA1C: No results for input(s): HGBA1C in the last 72 hours. CBG: Recent Labs  Lab 11/01/21 1633 11/01/21 2113 11/02/21 0811 11/02/21 1155  GLUCAP 296* 123* 118* 93   Lipid Profile: No results for input(s): CHOL, HDL, LDLCALC, TRIG, CHOLHDL, LDLDIRECT in the last 72 hours. Thyroid Function Tests: No results for input(s): TSH, T4TOTAL, FREET4, T3FREE, THYROIDAB in the last 72 hours. Anemia Panel: No results for input(s): VITAMINB12, FOLATE, FERRITIN, TIBC, IRON, RETICCTPCT in the last 72 hours. Sepsis Labs: Recent Labs  Lab 11/01/21 1024  PROCALCITON <0.10    Recent Results (from the past 240 hour(s))  Resp Panel by RT-PCR (Flu A&B, Covid) Anterior Nasal Swab     Status: None   Collection Time: 11/01/21 10:59 AM   Specimen: Anterior Nasal Swab  Result Value Ref Range Status   SARS Coronavirus 2 by RT PCR NEGATIVE NEGATIVE Final    Comment:  (NOTE) SARS-CoV-2 target nucleic acids are NOT DETECTED.  The SARS-CoV-2 RNA is generally detectable in upper respiratory specimens during the acute phase of infection. The lowest concentration of SARS-CoV-2 viral copies this assay can detect is 138 copies/mL. A negative result does not preclude SARS-Cov-2 infection and should not be used as the sole basis for treatment or other patient management decisions. A negative result may occur with  improper specimen collection/handling, submission of specimen other than nasopharyngeal swab, presence of viral mutation(s) within the areas targeted by this assay, and inadequate number of viral copies(<138 copies/mL). A negative result must be combined with clinical observations, patient history, and epidemiological information. The expected result is Negative.  Fact Sheet for Patients:  EntrepreneurPulse.com.au  Fact Sheet for Healthcare Providers:  IncredibleEmployment.be  This test is no t yet approved or cleared by the [place] FDA and  has been authorized for detection and/or diagnosis of SARS-CoV-2 by FDA under an Emergency Use Authorization (EUA). This EUA will remain  in effect (meaning this test can be used) for the duration of the COVID-19 declaration under Section  564(b)(1) of the Act, 21 U.S.C.section 360bbb-3(b)(1), unless the authorization is terminated  or revoked sooner.       Influenza A by PCR NEGATIVE NEGATIVE Final   Influenza B by PCR NEGATIVE NEGATIVE Final    Comment: (NOTE) The Xpert Xpress SARS-CoV-2/FLU/RSV plus assay is intended as an aid in the diagnosis of influenza from Nasopharyngeal swab specimens and should not be used as a sole basis for treatment. Nasal washings and aspirates are unacceptable for Xpert Xpress SARS-CoV-2/FLU/RSV testing.  Fact Sheet for Patients: EntrepreneurPulse.com.au  Fact Sheet for Healthcare  Providers: IncredibleEmployment.be  This test is not yet approved or cleared by the [place] FDA and has been authorized for detection and/or diagnosis of SARS-CoV-2 by FDA under an Emergency Use Authorization (EUA). This EUA will remain in effect (meaning this test can be used) for the duration of the COVID-19 declaration under Section 564(b)(1) of the Act, 21 U.S.C. section 360bbb-3(b)(1), unless the authorization is terminated or revoked.  Performed at [facility], [address] [place], [place] [address]     Radiology Studies: DG Chest 2 View  Result Date: 11/01/2021 CLINICAL DATA:  Shortness of breath EXAM: CHEST - 2 VIEW COMPARISON:  None Available. FINDINGS: Background changes of COPD with likely chronic interstitial prominence. No consolidation or edema. No pleural effusion or pneumothorax. Normal heart size. IMPRESSION: COPD.  No acute process in the chest. Electronically Signed   By: Macy Mis M.D.   On: 11/01/2021 10:48    Scheduled Meds:  alfuzosin  10 mg Oral Daily   aspirin  81 mg Oral Daily   dutasteride  0.5 mg Oral Daily   enoxaparin (LOVENOX) injection  40 mg Subcutaneous QHS   fluticasone furoate-vilanterol  1 puff Inhalation Daily   And   umeclidinium bromide  1 puff Inhalation Daily   insulin aspart  0-20 Units Subcutaneous TID WC   insulin aspart  0-5 Units Subcutaneous QHS   ipratropium-albuterol  3 mL Nebulization TID   irbesartan  150 mg Oral Daily   latanoprost  1 drop Both Eyes QHS   methylPREDNISolone (SOLU-MEDROL) injection  40 mg Intravenous Q12H   Followed by   Derrill Memo ON 11/03/2021] predniSONE  40 mg Oral Q breakfast   simvastatin  40 mg Oral q1800   verapamil  240 mg Oral Daily   Continuous Infusions:  azithromycin 500 mg (11/02/21 1000)     LOS: 0 days    Time spent: 35 mins    Oceanna Arruda, MD [facility]   If 7PM-7AM, please contact night-coverage

## 2021-11-03 ENCOUNTER — Ambulatory Visit: Payer: Medicare PPO

## 2021-11-03 LAB — BASIC METABOLIC PANEL
Anion gap: 7 (ref 5–15)
BUN: 48 mg/dL — ABNORMAL HIGH (ref 8–23)
CO2: 27 mmol/L (ref 22–32)
Calcium: 8.8 mg/dL — ABNORMAL LOW (ref 8.9–10.3)
Chloride: 106 mmol/L (ref 98–111)
Creatinine, Ser: 1.69 mg/dL — ABNORMAL HIGH (ref 0.61–1.24)
GFR, Estimated: 41 mL/min — ABNORMAL LOW (ref >60)
Glucose, Bld: 135 mg/dL — ABNORMAL HIGH (ref 70–99)
Potassium: 4.4 mmol/L (ref 3.5–5.1)
Sodium: 140 mmol/L (ref 135–145)

## 2021-11-03 LAB — RESPIRATORY PANEL BY PCR

## 2021-11-03 LAB — CBC
HCT: 44.8 % (ref 39.0–52.0)
Hemoglobin: 15 g/dL (ref 13.0–17.0)
MCH: 30.2 pg (ref 26.0–34.0)
MCHC: 33.5 g/dL (ref 30.0–36.0)
MCV: 90.3 fL (ref 80.0–100.0)
Platelets: 184 10*3/uL (ref 150–400)
RBC: 4.96 MIL/uL (ref 4.22–5.81)
RDW: 14.8 % (ref 11.5–15.5)
WBC: 16.8 10*3/uL — ABNORMAL HIGH (ref 4.0–10.5)
nRBC: 0 % (ref 0.0–0.2)

## 2021-11-03 LAB — MAGNESIUM: Magnesium: 2.3 mg/dL (ref 1.7–2.4)

## 2021-11-03 LAB — PHOSPHORUS: Phosphorus: 5.2 mg/dL — ABNORMAL HIGH (ref 2.5–4.6)

## 2021-11-03 LAB — GLUCOSE, CAPILLARY
Glucose-Capillary: 116 mg/dL — ABNORMAL HIGH (ref 70–99)
Glucose-Capillary: 122 mg/dL — ABNORMAL HIGH (ref 70–99)
Glucose-Capillary: 144 mg/dL — ABNORMAL HIGH (ref 70–99)
Glucose-Capillary: 139 mg/dL — ABNORMAL HIGH (ref 70–99)

## 2021-11-03 LAB — C-REACTIVE PROTEIN: CRP: 0.6 mg/dL (ref <1.0)

## 2021-11-03 MED ORDER — LORATADINE 10 MG PO TABS
10.0000 mg | ORAL_TABLET | Freq: Every day | ORAL | Status: DC
  Administered 2021-11-04 – 2021-11-05 (×2): 10 mg via ORAL
  Filled 2021-11-03 (×3): qty 1

## 2021-11-03 NOTE — Progress Notes (Signed)
SATURATION QUALIFICATIONS: (This note is used to comply with regulatory documentation for home oxygen)  Patient Saturations on Room Air at Rest = 88%  Patient Saturations on Room Air while Ambulating = 88%  Patient Saturations on 3 Liters of oxygen while Ambulating = 94%  Please briefly explain why patient needs home oxygen:  Patient's oxygen sat drops while without oxygen

## 2021-11-03 NOTE — Consult Note (Signed)
PULMONOLOGY         Date: 11/03/2021,   MRN# [id number] Donna BernardGregory Rua 1946/01/21     AdmissionWeight: 81.6 kg                 CurrentWeight: 81.6 kg  Referring provider: Dr. Lucianne MussKumar   CHIEF COMPLAINT:   Possible neoplasm on CT chest   HISTORY OF PRESENT ILLNESS   This is a pleasant 76 year old male with a history of dyslipidemia, essential hypertension, B12 deficiency, diabetes, CKD 3, advanced COPD, came in with mild tachypnea and shortness of breath was found to have a normal D-dimer negative COVID-19 influenza a and B testing thought to have COPD exacerbation and treated with Zithromax IV as well as nebulizer and Solu-Medrol therapy.  He has not used oxygen in the past at home and does not report having sick contacts prior to admission he did lose approximately 20 to 30 pounds unintentionally over the last 6 months currently smoking daily.  Patient uses trilogy inhaler at home.  CT chest was performed with findings of centrilobular emphysema as well as lung nodules with possible lung cancer.  I independently reviewed CT chest with left lower lobe nodule which is pleural-based close to the diaphragm and is unlikely to be clinically significant at this time.   PAST MEDICAL HISTORY   Past Medical History:  Diagnosis Date   COPD (chronic obstructive pulmonary disease) (HCC)    Hyperlipidemia    Hypertension      SURGICAL HISTORY   Past Surgical History:  Procedure Laterality Date   APPENDECTOMY     BACK SURGERY     CATARACT EXTRACTION     NASAL SEPTUM SURGERY       FAMILY HISTORY   Family History  Problem Relation Age of Onset   Diabetes Mother      SOCIAL HISTORY   Social History   Tobacco Use   Smoking status: Every Day    Types: Cigarettes   Smokeless tobacco: Never  Substance Use Topics   Alcohol use: Not Currently   Drug use: Not Currently     MEDICATIONS    Home Medication:    Current Medication:  Current Facility-Administered  Medications:    acetaminophen (TYLENOL) tablet 650 mg, 650 mg, Oral, Q6H PRN **OR** acetaminophen (TYLENOL) suppository 650 mg, 650 mg, Rectal, Q6H PRN, Cox, Amy N, DO   albuterol (VENTOLIN HFA) 108 (90 Base) MCG/ACT inhaler 2 puff, 2 puff, Inhalation, Q2H PRN, Cox, Amy N, DO   alfuzosin (UROXATRAL) 24 hr tablet 10 mg, 10 mg, Oral, Daily, Cox, Amy N, DO, 10 mg at 11/02/21 [id number]   aspirin chewable tablet 81 mg, 81 mg, Oral, Daily, Cox, Amy N, DO, 81 mg at 11/02/21 0952   azithromycin (ZITHROMAX) 500 mg in sodium chloride 0.9 % 250 mL IVPB, 500 mg, Intravenous, Q24H, Cox, Amy N, DO, Stopped at 11/02/21 1100   dutasteride (AVODART) capsule 0.5 mg, 0.5 mg, Oral, Daily, Cox, Amy N, DO, 0.5 mg at 11/02/21 0952   enoxaparin (LOVENOX) injection 40 mg, 40 mg, Subcutaneous, QHS, Cox, Amy N, DO, 40 mg at 11/02/21 2114   fluticasone furoate-vilanterol (BREO ELLIPTA) 100-25 MCG/ACT 1 puff, 1 puff, Inhalation, Daily, 1 puff at 11/02/21 0946 **AND** umeclidinium bromide (INCRUSE ELLIPTA) 62.5 MCG/ACT 1 puff, 1 puff, Inhalation, Daily, Cox, Amy N, DO, 1 puff at 11/02/21 0953   hydrALAZINE (APRESOLINE) tablet 10 mg, 10 mg, Oral, Q6H PRN, Cox, Amy N, DO   insulin aspart (novoLOG) injection 0-20  Units, 0-20 Units, Subcutaneous, TID WC, Cox, Amy N, DO, 4 Units at 11/02/21 1649   insulin aspart (novoLOG) injection 0-5 Units, 0-5 Units, Subcutaneous, QHS, Cox, Amy N, DO   ipratropium-albuterol (DUONEB) 0.5-2.5 (3) MG/3ML nebulizer solution 3 mL, 3 mL, Nebulization, TID, Cox, Amy N, DO, 3 mL at 11/02/21 2027   irbesartan (AVAPRO) tablet 150 mg, 150 mg, Oral, Daily, Cox, Amy N, DO, 150 mg at 11/02/21 0952   latanoprost (XALATAN) 0.005 % ophthalmic solution 1 drop, 1 drop, Both Eyes, QHS, Cox, Amy N, DO, 1 drop at 11/02/21 2107   melatonin tablet 10 mg, 10 mg, Oral, QHS PRN, Cox, Amy N, DO, 10 mg at 11/02/21 2114   ondansetron (ZOFRAN) tablet 4 mg, 4 mg, Oral, Q6H PRN **OR** ondansetron (ZOFRAN) injection 4 mg, 4 mg,  Intravenous, Q6H PRN, Cox, Amy N, DO   polyethylene glycol (MIRALAX / GLYCOLAX) packet 17 g, 17 g, Oral, Daily PRN, Cox, Amy N, DO   [COMPLETED] methylPREDNISolone sodium succinate (SOLU-MEDROL) 40 mg/mL injection 40 mg, 40 mg, Intravenous, Q12H, 40 mg at 11/02/21 2038 **FOLLOWED BY** predniSONE (DELTASONE) tablet 40 mg, 40 mg, Oral, Q breakfast, Cox, Amy N, DO   senna-docusate (Senokot-S) tablet 1 tablet, 1 tablet, Oral, BID PRN, Cox, Amy N, DO   simvastatin (ZOCOR) tablet 40 mg, 40 mg, Oral, q1800, Cox, Amy N, DO, 40 mg at 11/02/21 1650   verapamil (CALAN-SR) CR tablet 240 mg, 240 mg, Oral, Daily, Cox, Amy N, DO, 240 mg at 11/02/21 8127    ALLERGIES   Moxifloxacin     REVIEW OF SYSTEMS    Review of Systems:  Gen:  Denies  fever, sweats, chills weigh loss  HEENT: Denies blurred vision, double vision, ear pain, eye pain, hearing loss, nose bleeds, sore throat Cardiac:  No dizziness, chest pain or heaviness, chest tightness,edema Resp:   reports dyspnea chronically  Gi: Denies swallowing difficulty, stomach pain, nausea or vomiting, diarrhea, constipation, bowel incontinence Gu:  Denies bladder incontinence, burning urine Ext:   Denies Joint pain, stiffness or swelling Skin: Denies  skin rash, easy bruising or bleeding or hives Endoc:  Denies polyuria, polydipsia , polyphagia or weight change Psych:   Denies depression, insomnia or hallucinations   Other:  All other systems negative   VS: BP (!) 168/75 (BP Location: Left Arm)   Pulse 87   Temp 98.3 F (36.8 C)   Resp 18   Ht 5\' 11"  (1.803 m)   Wt 81.6 kg   SpO2 95%   BMI 25.10 kg/m      PHYSICAL EXAM    GENERAL:NAD, no fevers, chills, no weakness no fatigue HEAD: Normocephalic, atraumatic.  EYES: Pupils equal, round, reactive to light. Extraocular muscles intact. No scleral icterus.  MOUTH: Moist mucosal membrane. Dentition intact. No abscess noted.  EAR, NOSE, THROAT: Clear without exudates. No external lesions.   NECK: Supple. No thyromegaly. No nodules. No JVD.  PULMONARY: decreased breath sounds with mild rhonchi worse at bases bilaterally.  CARDIOVASCULAR: S1 and S2. Regular rate and rhythm. No murmurs, rubs, or gallops. No edema. Pedal pulses 2+ bilaterally.  GASTROINTESTINAL: Soft, nontender, nondistended. No masses. Positive bowel sounds. No hepatosplenomegaly.  MUSCULOSKELETAL: No swelling, clubbing, or edema. Range of motion full in all extremities.  NEUROLOGIC: Cranial nerves II through XII are intact. No gross focal neurological deficits. Sensation intact. Reflexes intact.  SKIN: No ulceration, lesions, rashes, or cyanosis. Skin warm and dry. Turgor intact.  PSYCHIATRIC: Mood, affect within normal limits. The patient  is awake, alert and oriented x 3. Insight, judgment intact.       IMAGING   CT chest done on admission with findings of centrilobular emphysema and small nodules worse at the left lower lobe close to the diaphragm abutting the pleura without spiculated margins.  ASSESSMENT/PLAN   Acute exacerbation of COPD -Perform respiratory viral panel -COVID-19 influenza a and B are both negative -Agree with current COPD care path with steroids and antimicrobials   Bilateral lung nodules   -We will perform additional work-up on outpatient post discharge once in chronic stable state   Atelectasis left lower lobe    Incentive spirometry -Flutter Acapella device      Thank you for allowing me to participate in the care of this patient.   Patient/Family are satisfied with care plan and all questions have been answered.    Provider disclosure: Patient with at least one acute or chronic illness or injury that poses a threat to life or bodily function and is being managed actively during this encounter.  All of the below services have been performed independently by signing provider:  review of prior documentation from internal and or external health records.  Review of previous  and current lab results.  Interview and comprehensive assessment during patient visit today. Review of current and previous chest radiographs/CT scans. Discussion of management and test interpretation with health care team and patient/family.   This document was prepared using Dragon voice recognition software and may include unintentional dictation errors.     Vida Rigger, M.D.  Division of Pulmonary & Critical Care Medicine

## 2021-11-03 NOTE — TOC Progression Note (Signed)
Transition of Care [facility]) - Progression Note    Patient Details  Name: Bryan Mcclain MRN: [id number] Date of Birth: 11-23-1945  Transition of Care [facility]) CM/SW Contact  Maree Krabbe, LCSW Phone Number: 11/03/2021, 2:29 PM  Clinical Narrative:   02 ordered through [employer].         Expected Discharge Plan and Services                                                 Social Determinants of Health (SDOH) Interventions    Readmission Risk Interventions     No data to display

## 2021-11-03 NOTE — Progress Notes (Signed)
PROGRESS NOTE    Bryan Mcclain  [id number] DOB: [birth year] DOA: 11/01/2021  PCP: Rusty Aus, MD   Brief Narrative: This 76 years old male with history of COPD, hyperlipidemia, hypertension, B12 deficiency, non-insulin-dependent diabetes mellitus, CKD stage IIIa presented in the ED with complaints of shortness of breath for 1 to 2 days.  COVID and influenza negative. Patient was given DuoNeb x2, Solu-Medrol 125 mg once in the ED.  Patient does not use oxygen at baseline.  He reports occasional productive cough of white phlegm. Patient is admitted for acute hypoxic respiratory failure secondary to COPD with acute exacerbation.  CT chest showed increase in size of lung nodule.  Pulmonology is consulted,  awaiting recommendation.  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 Likely secondary to COPD exacerbation. BNP and troponin unremarkable.  Continue supplemental oxygen and wean as tolerated. Procalcitonin negative.  Continue Solu-Medrol, and nebulizer inhalers.  COPD with acute exacerbation: Continue supplemental oxygen and wean as tolerated. Continue Trelegy Ellipta inhalation once daily. Continue Solu-Medrol 40 mg IV q. twice daily, plan to transition to prednisone tomorrow. Given Zithromax 500 g IV once.  CKD stage IIIa: Serum creatinine at baseline.  Avoid nephrotoxic medications.  Type 2 diabetes: Hemoglobin A1c 3/23 was 5.8. Continue regular insulin sliding scale due to steroid dosing.  Hyperlipidemia: Continue simvastatin 40 mg daily  Essential hypertension: Continue verapamil 40 mg daily Olmesartan 20 mg daily,  Lung  nodule: CT chest showed  A 9 x 18 mm lobulated left lower lobe pulmonary nodule.  Findings are concerning for malignancy until proven otherwise. Pulmonology consulted awaiting recommendation. Explained to the family and the patient differential diagnosis.   DVT prophylaxis: Lovenox Code Status: Full code Family Communication: Wife at bedside Disposition Plan:   Status is: Inpatient Remains inpatient appropriate because: Admitted for COPD exacerbation requiring supplemental oxygen, Solu-Medrol and nebulized bronchodilators.  CT chest shows lung nodules concerning for malignancy.  Pulmonology is consulted  Anticipated discharge home in 1 to 2 days.    Consultants:  None  Procedures: None Antimicrobials: Anti-infectives (From admission, onward)    Start     Dose/Rate Route Frequency Ordered Stop   11/02/21 1000  azithromycin (ZITHROMAX) 500 mg in sodium chloride 0.9 % 250 mL IVPB        500 mg 250 mL/hr over 60 Minutes Intravenous Every 24 hours 11/01/21 1351 11/03/21 1056   11/01/21 1330  azithromycin (ZITHROMAX) tablet 500 mg        500 mg Oral  Once 11/01/21 1323 11/01/21 1400       Subjective: Patient was seen and examined at bedside.  Overnight events noted.   Patient reports feeling better, still has wheezing noted on exam.   He remains on 3 L of supplemental oxygen. He denies chest pain, shortness of breath.  Objective: Vitals:   11/03/21 0435 11/03/21 0739 11/03/21 0850 11/03/21 1221  BP: 126/80 (!) 168/75  (!) 127/59  Pulse: 83 87 78 88  Resp: 18 18 18 20   Temp: 98.1 F (36.7 C) 98.3 F (36.8 C)  97.8 F (36.6 C)  TempSrc:      SpO2: 95% 95% 91%  93%  Weight:      Height:        Intake/Output Summary (Last 24 hours) at 11/03/2021 1232 Last data filed at 11/03/2021 1000 Gross per 24 hour  Intake 730 ml  Output --  Net 730 ml   Filed Weights   11/01/21 1020  Weight: 81.6 kg    Examination:  General exam: Appears comfortable, not in any acute  distress. Respiratory system: Wheezing noted on exam, respiratory effort normal, no accessory muscle use. Cardiovascular system: S1-S2 heard, regular rate and rhythm, no murmur. Gastrointestinal system: Abdomen is soft, non tender, non distended, BS+. Central nervous system: Alert and oriented x 3. No focal neurological deficits. Extremities: No edema, no cyanosis, no clubbing. Skin: No rashes, lesions or ulcers Psychiatry: Judgement and insight appear normal. Mood & affect appropriate.     Data Reviewed: I have personally reviewed following labs and imaging studies  CBC: Recent Labs  Lab 11/01/21 1024 11/02/21 0617 11/03/21 0439  WBC 8.3 10.6* 16.8*  HGB 15.6 15.0 15.0  HCT 46.2 44.1 44.8  MCV 90.4 88.6 90.3  PLT 185 187 [id number]   Basic Metabolic Panel: Recent Labs  Lab 11/01/21 1024 11/02/21 0617 11/03/21 0439  NA 141 139 140  K 3.2* 4.1 4.4  CL 102 104 106  CO2 30 28 27   GLUCOSE 100* 126* 135*  BUN 24* 38* 48*  CREATININE 1.31* 1.68* 1.69*  CALCIUM 9.1 9.1 8.8*  MG  --   --  2.3  PHOS  --   --  5.2*   GFR: Estimated Creatinine Clearance: 39 mL/min (A) (by C-G formula based on SCr of 1.69 mg/dL (H)). Liver Function Tests: No results for input(s): "AST", "ALT", "ALKPHOS", "BILITOT", "PROT", "ALBUMIN" in the last 168 hours. No results for input(s): "LIPASE", "AMYLASE" in the last 168 hours. No results for input(s): "AMMONIA" in the last 168 hours. Coagulation Profile: No results for input(s): "INR", "PROTIME" in the last 168 hours. Cardiac Enzymes: No results for input(s): "CKTOTAL", "CKMB", "CKMBINDEX", "TROPONINI" in the last 168 hours. BNP (last 3 results) No results for input(s): "PROBNP" in the last 8760 hours. HbA1C: No results for input(s): "HGBA1C" in the last 72 hours. CBG: Recent Labs  Lab 11/02/21 1155 11/02/21 1633 11/02/21 2054 11/03/21 0740 11/03/21 1218  GLUCAP 93 184* 126* 116* 122*   Lipid Profile: No results for input(s): "CHOL", "HDL",  "LDLCALC", "TRIG", "CHOLHDL", "LDLDIRECT" in the last 72 hours. Thyroid Function Tests: No results for input(s): "TSH", "T4TOTAL", "FREET4", "T3FREE", "THYROIDAB" in the last 72 hours. Anemia Panel: No results for input(s): "VITAMINB12", "FOLATE", "FERRITIN", "TIBC", "IRON", "RETICCTPCT" in the last 72 hours. Sepsis Labs: Recent Labs  Lab 11/01/21 1024  PROCALCITON <0.10    Recent Results (from the past 240 hour(s))  Resp Panel by RT-PCR (Flu A&B, Covid) Anterior Nasal Swab     Status: None   Collection Time: 11/01/21 10:59 AM   Specimen: Anterior Nasal Swab  Result Value Ref Range Status   SARS Coronavirus 2 by RT PCR NEGATIVE NEGATIVE Final    Comment: (NOTE) SARS-CoV-2 target nucleic acids are NOT DETECTED.  The SARS-CoV-2 RNA is generally detectable in upper respiratory specimens during the acute phase of infection. The lowest concentration of SARS-CoV-2 viral copies this assay can detect is 138 copies/mL. A negative result does not preclude SARS-Cov-2 infection and should not be used as the sole basis for treatment or other patient management decisions. A negative result may occur with  improper specimen collection/handling, submission of specimen  other than nasopharyngeal swab, presence of viral mutation(s) within the areas targeted by this assay, and inadequate number of viral copies(<138 copies/mL). A negative result must be combined with clinical observations, patient history, and epidemiological information. The expected result is Negative.  Fact Sheet for Patients:  BloggerCourse.com  Fact Sheet for Healthcare Providers:  SeriousBroker.it  This test is no t yet approved or cleared by the [place] FDA and  has been authorized for detection and/or diagnosis of SARS-CoV-2 by FDA under an Emergency Use Authorization (EUA). This EUA will remain  in effect (meaning this test can be used) for the duration of  the COVID-19 declaration under Section 564(b)(1) of the Act, 21 U.S.C.section 360bbb-3(b)(1), unless the authorization is terminated  or revoked sooner.       Influenza A by PCR NEGATIVE NEGATIVE Final   Influenza B by PCR NEGATIVE NEGATIVE Final    Comment: (NOTE) The Xpert Xpress SARS-CoV-2/FLU/RSV plus assay is intended as an aid in the diagnosis of influenza from Nasopharyngeal swab specimens and should not be used as a sole basis for treatment. Nasal washings and aspirates are unacceptable for Xpert Xpress SARS-CoV-2/FLU/RSV testing.  Fact Sheet for Patients: BloggerCourse.com  Fact Sheet for Healthcare Providers: SeriousBroker.it  This test is not yet approved or cleared by the [place] FDA and has been authorized for detection and/or diagnosis of SARS-CoV-2 by FDA under an Emergency Use Authorization (EUA). This EUA will remain in effect (meaning this test can be used) for the duration of the COVID-19 declaration under Section 564(b)(1) of the Act, 21 U.S.C. section 360bbb-3(b)(1), unless the authorization is terminated or revoked.  Performed at [facility], [address] [place], [place] [address]     Radiology Studies: CT CHEST W CONTRAST  Result Date: 11/02/2021 CLINICAL DATA:  Shortness of breath EXAM: CT CHEST WITH CONTRAST TECHNIQUE: Multidetector CT imaging of the chest was performed during intravenous contrast administration. RADIATION DOSE REDUCTION: This exam was performed according to the departmental dose-optimization program which includes automated exposure control, adjustment of the mA and/or kV according to patient size and/or use of iterative reconstruction technique. CONTRAST:  53mL OMNIPAQUE IOHEXOL 300 MG/ML  SOLN COMPARISON:  None Available. FINDINGS: Cardiovascular: No significant vascular findings. Normal heart size. No pericardial effusion. Thoracic aortic atherosclerosis.  Mediastinum/Nodes: No enlarged mediastinal, hilar, or axillary lymph nodes. Thyroid gland, trachea, and esophagus demonstrate no significant findings. Lungs/Pleura: 9 x 18 mm lobulated left lower lobe pulmonary nodule. Bibasilar scarring versus atelectasis. Centrilobular emphysema. 5 mm right upper lobe pulmonary nodule. Upper Abdomen: No acute abnormality. Nonobstructing 4 mm right renal calculus. Abdominal aortic atherosclerosis. Musculoskeletal: No acute osseous abnormality. No aggressive osseous lesion. 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 IMPRESSION: 1. A 9 x 18 mm lobulated left lower lobe pulmonary nodule. Findings are concerning for malignancy until proven otherwise. 5 mm right upper lobe pulmonary nodule. Consider one of the following in 3 months for both low-risk and high-risk individuals: (a) repeat chest CT, (b) follow-up PET-CT, or (c) tissue sampling. This recommendation follows the consensus statement: Guidelines for Management of Incidental Pulmonary Nodules Detected on CT Images: From the Fleischner Society 2017; Radiology 2017; 284:228-243. 2. 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 3. Nonobstructing 4 mm right renal calculus. 4. Bibasilar scarring versus atelectasis. 5. Aortic Atherosclerosis (ICD10-I70.0) and Emphysema (ICD10-J43.9). Electronically Signed   By: Dillard Cannon.D.  On: 11/02/2021 15:54    Scheduled Meds:  alfuzosin  10 mg Oral Daily   aspirin  81 mg Oral Daily   dutasteride  0.5 mg Oral Daily   enoxaparin (LOVENOX) injection  40 mg Subcutaneous QHS   fluticasone furoate-vilanterol  1 puff Inhalation Daily   And   umeclidinium bromide  1 puff Inhalation Daily   insulin aspart  0-20 Units Subcutaneous TID WC   insulin aspart  0-5 Units Subcutaneous QHS   ipratropium-albuterol  3 mL Nebulization TID   irbesartan  150 mg Oral Daily    latanoprost  1 drop Both Eyes QHS   predniSONE  40 mg Oral Q breakfast   simvastatin  40 mg Oral q1800   verapamil  240 mg Oral Daily   Continuous Infusions:     LOS: 1 day    Time spent: 35 mins    Ezriel Boffa, MD [facility]   If 7PM-7AM, please contact night-coverage

## 2021-11-04 LAB — CBC
HCT: 43.4 % (ref 39.0–52.0)
Hemoglobin: 14.2 g/dL (ref 13.0–17.0)
MCH: 29.7 pg (ref 26.0–34.0)
MCHC: 32.7 g/dL (ref 30.0–36.0)
MCV: 90.8 fL (ref 80.0–100.0)
Platelets: 187 10*3/uL (ref 150–400)
RBC: 4.78 MIL/uL (ref 4.22–5.81)
RDW: 14.9 % (ref 11.5–15.5)
WBC: 16.6 10*3/uL — ABNORMAL HIGH (ref 4.0–10.5)
nRBC: 0 % (ref 0.0–0.2)

## 2021-11-04 LAB — GLUCOSE, CAPILLARY
Glucose-Capillary: 86 mg/dL (ref 70–99)
Glucose-Capillary: 121 mg/dL — ABNORMAL HIGH (ref 70–99)
Glucose-Capillary: 103 mg/dL — ABNORMAL HIGH (ref 70–99)
Glucose-Capillary: 96 mg/dL (ref 70–99)

## 2021-11-04 LAB — BASIC METABOLIC PANEL
Anion gap: 3 — ABNORMAL LOW (ref 5–15)
BUN: 40 mg/dL — ABNORMAL HIGH (ref 8–23)
CO2: 30 mmol/L (ref 22–32)
Calcium: 8.5 mg/dL — ABNORMAL LOW (ref 8.9–10.3)
Chloride: 107 mmol/L (ref 98–111)
Creatinine, Ser: 1.38 mg/dL — ABNORMAL HIGH (ref 0.61–1.24)
GFR, Estimated: 53 mL/min — ABNORMAL LOW (ref >60)
Glucose, Bld: 99 mg/dL (ref 70–99)
Potassium: 4.1 mmol/L (ref 3.5–5.1)
Sodium: 140 mmol/L (ref 135–145)

## 2021-11-04 MED ORDER — BISACODYL 5 MG PO TBEC
5.0000 mg | DELAYED_RELEASE_TABLET | Freq: Every day | ORAL | Status: DC | PRN
  Administered 2021-11-04: 5 mg via ORAL
  Filled 2021-11-04 (×2): qty 1

## 2021-11-04 MED ORDER — IPRATROPIUM-ALBUTEROL 0.5-2.5 (3) MG/3ML IN SOLN
3.0000 mL | Freq: Three times a day (TID) | RESPIRATORY_TRACT | Status: DC
Start: 2021-11-04 — End: 2021-11-05
  Administered 2021-11-04 – 2021-11-05 (×2): 3 mL via RESPIRATORY_TRACT
  Filled 2021-11-04 (×2): qty 3

## 2021-11-04 MED ORDER — SENNA 8.6 MG PO TABS
1.0000 | ORAL_TABLET | Freq: Two times a day (BID) | ORAL | Status: DC
  Administered 2021-11-04 – 2021-11-05 (×3): 8.6 mg via ORAL
  Filled 2021-11-04 (×3): qty 1

## 2021-11-04 MED ORDER — IPRATROPIUM-ALBUTEROL 0.5-2.5 (3) MG/3ML IN SOLN
3.0000 mL | Freq: Three times a day (TID) | RESPIRATORY_TRACT | Status: DC
Start: 2021-11-04 — End: 2021-11-04
  Administered 2021-11-04: 3 mL via RESPIRATORY_TRACT

## 2021-11-04 MED ORDER — SODIUM CHLORIDE 0.9 % IV SOLN
500.0000 mg | INTRAVENOUS | Status: AC
  Administered 2021-11-04: 500 mg via INTRAVENOUS
  Filled 2021-11-04: qty 500

## 2021-11-04 NOTE — Progress Notes (Signed)
PULMONOLOGY         Date: 11/04/2021,   MRN# [id number] Bryan Mcclain 1946/03/20     AdmissionWeight: 81.6 kg                 CurrentWeight: 81.6 kg  Referring provider: Dr. Lucianne Muss   CHIEF COMPLAINT:   Possible neoplasm on CT chest   HISTORY OF PRESENT ILLNESS   This is a pleasant 76 year old male with a history of dyslipidemia, essential hypertension, B12 deficiency, diabetes, CKD 3, advanced COPD, came in with mild tachypnea and shortness of breath was found to have a normal D-dimer negative COVID-19 influenza a and B testing thought to have COPD exacerbation and treated with Zithromax IV as well as nebulizer and Solu-Medrol therapy.  He has not used oxygen in the past at home and does not report having sick contacts prior to admission he did lose approximately 20 to 30 pounds unintentionally over the last 6 months currently smoking daily.  Patient uses trilogy inhaler at home.  CT chest was performed with findings of centrilobular emphysema as well as lung nodules with possible lung cancer.  I independently reviewed CT chest with left lower lobe nodule which is pleural-based close to the diaphragm and is unlikely to be clinically significant at this time.   11/04/21- patient is on 4L/min reports constipation. Asking to go home. I think he can be dcd tommorow with supplemental O2.  I estimate recovery to take 3 wks.  Recommend to dc on zithromax 250 x 5 days and prednisone 50 with weaning by 5mg  per day.   NOT DETECTED NOT DETECTED   Parainfluenza Virus 1 NOT DETECTED NOT DETECTED   Parainfluenza Virus 2 NOT DETECTED NOT DETECTED   Parainfluenza Virus 3 NOT DETECTED NOT DETECTED   Parainfluenza Virus 4 NOT DETECTED DETECTED Abnormal    Respiratory Syncytial Virus NOT DETECTED NOT DETECTED   Bordetella pertussis NOT DETECTED NOT DETECTED   Bordetella Parapertussis NOT DETECTED NOT DETECTED   Chlamydophila pneumoniae NOT DETECTED NOT DETECTED   Mycoplasma pneumoniae NOT  DETECTED NOT DETECTED      PAST MEDICAL HISTORY   Past Medical History:  Diagnosis Date   COPD (chronic obstructive pulmonary disease) (HCC)    Hyperlipidemia    Hypertension      SURGICAL HISTORY   Past Surgical History:  Procedure Laterality Date   APPENDECTOMY     BACK SURGERY     CATARACT EXTRACTION     NASAL SEPTUM SURGERY       FAMILY HISTORY   Family History  Problem Relation Age of Onset   Diabetes Mother      SOCIAL HISTORY   Social History   Tobacco Use   Smoking status: Every Day    Types: Cigarettes   Smokeless tobacco: Never  Substance Use Topics   Alcohol use: Not Currently   Drug use: Not Currently     MEDICATIONS    Home Medication:    Current Medication:  Current Facility-Administered Medications:    acetaminophen (TYLENOL) tablet 650 mg, 650 mg, Oral, Q6H PRN **OR** acetaminophen (TYLENOL) suppository 650 mg, 650 mg, Rectal, Q6H PRN, Cox, Amy N, DO   albuterol (VENTOLIN HFA) 108 (90 Base) MCG/ACT inhaler 2 puff, 2 puff, Inhalation, Q2H PRN, Cox, Amy N, DO   alfuzosin (UROXATRAL) 24 hr tablet 10 mg, 10 mg, Oral, Daily, Cox, Amy N, DO, 10 mg at 11/04/21 0807   aspirin chewable tablet 81 mg, 81 mg, Oral, Daily, Cox, Amy  N, DO, 81 mg at 11/04/21 0807   azithromycin (ZITHROMAX) 500 mg in sodium chloride 0.9 % 250 mL IVPB, 500 mg, Intravenous, Q24H, Cipriano Bunker, MD   bisacodyl (DULCOLAX) EC tablet 5 mg, 5 mg, Oral, Daily PRN, Cipriano Bunker, MD   dutasteride (AVODART) capsule 0.5 mg, 0.5 mg, Oral, Daily, Cox, Amy N, DO, 0.5 mg at 11/04/21 0807   enoxaparin (LOVENOX) injection 40 mg, 40 mg, Subcutaneous, QHS, Cox, Amy N, DO, 40 mg at 11/03/21 2233   fluticasone furoate-vilanterol (BREO ELLIPTA) 100-25 MCG/ACT 1 puff, 1 puff, Inhalation, Daily, 1 puff at 11/04/21 0813 **AND** umeclidinium bromide (INCRUSE ELLIPTA) 62.5 MCG/ACT 1 puff, 1 puff, Inhalation, Daily, Cox, Amy N, DO, 1 puff at 11/04/21 0813   hydrALAZINE (APRESOLINE) tablet 10 mg,  10 mg, Oral, Q6H PRN, Cox, Amy N, DO   insulin aspart (novoLOG) injection 0-20 Units, 0-20 Units, Subcutaneous, TID WC, Cox, Amy N, DO, 3 Units at 11/03/21 1721   insulin aspart (novoLOG) injection 0-5 Units, 0-5 Units, Subcutaneous, QHS, Cox, Amy N, DO   ipratropium-albuterol (DUONEB) 0.5-2.5 (3) MG/3ML nebulizer solution 3 mL, 3 mL, Nebulization, TID, Cipriano Bunker, MD, 3 mL at 11/04/21 0914   irbesartan (AVAPRO) tablet 150 mg, 150 mg, Oral, Daily, Cox, Amy N, DO, 150 mg at 11/04/21 0807   latanoprost (XALATAN) 0.005 % ophthalmic solution 1 drop, 1 drop, Both Eyes, QHS, Cox, Amy N, DO, 1 drop at 11/03/21 2234   loratadine (CLARITIN) tablet 10 mg, 10 mg, Oral, Daily, Manuela Schwartz, NP, 10 mg at 11/04/21 9030   melatonin tablet 10 mg, 10 mg, Oral, QHS PRN, Cox, Amy N, DO, 10 mg at 11/02/21 2114   ondansetron (ZOFRAN) tablet 4 mg, 4 mg, Oral, Q6H PRN **OR** ondansetron (ZOFRAN) injection 4 mg, 4 mg, Intravenous, Q6H PRN, Cox, Amy N, DO   polyethylene glycol (MIRALAX / GLYCOLAX) packet 17 g, 17 g, Oral, Daily PRN, Cox, Amy N, DO, 17 g at 11/04/21 0807   [COMPLETED] methylPREDNISolone sodium succinate (SOLU-MEDROL) 40 mg/mL injection 40 mg, 40 mg, Intravenous, Q12H, 40 mg at 11/02/21 2038 **FOLLOWED BY** predniSONE (DELTASONE) tablet 40 mg, 40 mg, Oral, Q breakfast, Cox, Amy N, DO, 40 mg at 11/04/21 0923   senna (SENOKOT) tablet 8.6 mg, 1 tablet, Oral, BID, Cipriano Bunker, MD, 8.6 mg at 11/04/21 3007   senna-docusate (Senokot-S) tablet 1 tablet, 1 tablet, Oral, BID PRN, Cox, Amy N, DO   simvastatin (ZOCOR) tablet 40 mg, 40 mg, Oral, q1800, Cox, Amy N, DO, 40 mg at 11/03/21 1722   verapamil (CALAN-SR) CR tablet 240 mg, 240 mg, Oral, Daily, Cox, Amy N, DO, 240 mg at 11/04/21 0807    ALLERGIES   Moxifloxacin     REVIEW OF SYSTEMS    Review of Systems:  Gen:  Denies  fever, sweats, chills weigh loss  HEENT: Denies blurred vision, double vision, ear pain, eye pain, hearing loss, nose  bleeds, sore throat Cardiac:  No dizziness, chest pain or heaviness, chest tightness,edema Resp:   reports dyspnea chronically  Gi: Denies swallowing difficulty, stomach pain, nausea or vomiting, diarrhea, constipation, bowel incontinence Gu:  Denies bladder incontinence, burning urine Ext:   Denies Joint pain, stiffness or swelling Skin: Denies  skin rash, easy bruising or bleeding or hives Endoc:  Denies polyuria, polydipsia , polyphagia or weight change Psych:   Denies depression, insomnia or hallucinations   Other:  All other systems negative   VS: BP (!) 158/87 (BP Location: Left Arm)   Pulse 77  Temp 97.7 F (36.5 C)   Resp 18   Ht 5\' 11"  (1.803 m)   Wt 81.6 kg   SpO2 94%   BMI 25.10 kg/m      PHYSICAL EXAM    GENERAL:NAD, no fevers, chills, no weakness no fatigue HEAD: Normocephalic, atraumatic.  EYES: Pupils equal, round, reactive to light. Extraocular muscles intact. No scleral icterus.  MOUTH: Moist mucosal membrane. Dentition intact. No abscess noted.  EAR, NOSE, THROAT: Clear without exudates. No external lesions.  NECK: Supple. No thyromegaly. No nodules. No JVD.  PULMONARY: decreased breath sounds with mild rhonchi worse at bases bilaterally.  CARDIOVASCULAR: S1 and S2. Regular rate and rhythm. No murmurs, rubs, or gallops. No edema. Pedal pulses 2+ bilaterally.  GASTROINTESTINAL: Soft, nontender, nondistended. No masses. Positive bowel sounds. No hepatosplenomegaly.  MUSCULOSKELETAL: No swelling, clubbing, or edema. Range of motion full in all extremities.  NEUROLOGIC: Cranial nerves II through XII are intact. No gross focal neurological deficits. Sensation intact. Reflexes intact.  SKIN: No ulceration, lesions, rashes, or cyanosis. Skin warm and dry. Turgor intact.  PSYCHIATRIC: Mood, affect within normal limits. The patient is awake, alert and oriented x 3. Insight, judgment intact.       IMAGING   CT chest done on admission with findings of  centrilobular emphysema and small nodules worse at the left lower lobe close to the diaphragm abutting the pleura without spiculated margins.  ASSESSMENT/PLAN   Acute exacerbation of COPD -Perform respiratory viral panel- parainfluenza 4+ -COVID-19 influenza a and B are both negative -Agree with current COPD care path with steroids and antimicrobials   Bilateral lung nodules   -We will perform additional work-up on outpatient post discharge once in chronic stable state   Atelectasis left lower lobe    Incentive spirometry -Flutter Acapella device      Thank you for allowing me to participate in the care of this patient.   Patient/Family are satisfied with care plan and all questions have been answered.    Provider disclosure: Patient with at least one acute or chronic illness or injury that poses a threat to life or bodily function and is being managed actively during this encounter.  All of the below services have been performed independently by signing provider:  review of prior documentation from internal and or external health records.  Review of previous and current lab results.  Interview and comprehensive assessment during patient visit today. Review of current and previous chest radiographs/CT scans. Discussion of management and test interpretation with health care team and patient/family.   This document was prepared using Dragon voice recognition software and may include unintentional dictation errors.     Vida RiggerFuad Dorma Altman, M.D.  Division of Pulmonary & Critical Care Medicine

## 2021-11-04 NOTE — TOC Progression Note (Signed)
Transition of Care [facility]) - Progression Note    Patient Details  Name: Bryan Mcclain MRN: [id number] Date of Birth: [birth year]  Transition of Care [facility]) CM/SW Contact  Maree Krabbe, LCSW Phone Number: 11/04/2021, 10:39 AM  Clinical Narrative: 02 to be delivered to bedside today. Potential dc tomorrow per MD.           Expected Discharge Plan and Services                                                 Social Determinants of Health (SDOH) Interventions    Readmission Risk Interventions     No data to display

## 2021-11-04 NOTE — Progress Notes (Addendum)
PROGRESS NOTE    Demon Hotop  [id number] DOB: [birth year] DOA: 11/01/2021  PCP: Rusty Aus, MD   Brief Narrative: This 76 years old male with history of COPD, hyperlipidemia, hypertension, B12 deficiency, non-insulin-dependent diabetes mellitus, CKD stage IIIa presented in the ED with complaints of shortness of breath for 1 to 2 days.  COVID and influenza negative. Patient was given DuoNeb x2, Solu-Medrol 125 mg once in the ED.  Patient does not use oxygen at baseline.  He reports occasional productive cough of white phlegm. Patient is admitted for acute hypoxic respiratory failure secondary to COPD with acute exacerbation.  CT chest showed increase in size of lung nodule. Pulmonology is consulted,   recommended additional work up outpatient once stable.  Continue COPD treatment.  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 Likely secondary to COPD exacerbation. BNP and troponin unremarkable.  Continue supplemental oxygen and wean as tolerated. Procalcitonin negative.  RVP + Parainfluenza ,  Continue Solu-Medrol, and nebulized inhalers.  COPD with acute exacerbation: Continue supplemental oxygen and wean as tolerated. Continue Trelegy Ellipta inhalation once daily. Initiated Solu-Medrol 40 mg IV q. twice daily, transitioned to prednisone. Continue Zithromax IV for 3 days. Discussed with pulmonology that patient can be discharged tomorrow on Zithromax 250 mg daily for 5 days and prednisone taper starting 50 mg daily weaning by 5 mg daily.  CKD stage IIIa: Serum creatinine at baseline.   Avoid nephrotoxic medications.  Type 2  diabetes: Hemoglobin A1c 3/23 was 5.8. Continue regular insulin sliding scale due to steroid dosing.  Hyperlipidemia: Continue simvastatin 40 mg daily  Essential hypertension: Continue verapamil 40 mg daily Olmesartan 20 mg daily,  Lung nodule: CT chest showed  A 9 x 18 mm lobulated left lower lobe pulmonary nodule.  Findings are concerning for malignancy until proven otherwise. Pulmonology consulted recommended outpatient work-up.  Constipation: Patient report has not had a bowel movement in 3 days. Start Senokot and Colace.   Patient offered tapwater enema , he declined.   DVT prophylaxis: Lovenox Code Status: Full code Family Communication: Wife at bedside Disposition Plan:   Status is: Inpatient Remains inpatient appropriate because: Admitted for COPD exacerbation requiring supplemental oxygen, Solu-Medrol and nebulized bronchodilators.  CT chest shows lung nodules concerning for malignancy.  Pulmonology is consulted, recommended outpatient work-up.  Anticipated discharge home tomorrow.    Consultants:  None  Procedures: None Antimicrobials: Anti-infectives (From admission, onward)    Start     Dose/Rate Route Frequency Ordered Stop   11/04/21 1230  azithromycin (ZITHROMAX) 500 mg in sodium chloride 0.9 % 250 mL IVPB        500 mg 250 mL/hr over 60 Minutes Intravenous Every 24 hours 11/04/21 1139 11/05/21 1229   11/02/21 1000  azithromycin (ZITHROMAX) 500 mg in sodium chloride 0.9 % 250 mL IVPB        500 mg 250 mL/hr over 60 Minutes Intravenous Every 24 hours 11/01/21 1351 11/03/21 1200   11/01/21 1330  azithromycin (ZITHROMAX) tablet 500 mg        500 mg Oral  Once 11/01/21 1323 11/01/21 1400       Subjective: Patient was  seen and examined at bedside. Overnight events noted.   Patient reports feeling better,  still has shortness of breath requiring 3 L of oxygen. Patient reports has not had a bowel movement in last 3 days, reports feeling  bloated.  Objective: Vitals:   11/04/21 0030 11/04/21 0432 11/04/21 0837 11/04/21 0914  BP: (!) 150/81 (!) 173/88 (!) 158/87   Pulse: 65 73 78 77  Resp: 16 17 14 18   Temp: 98.1 F (36.7 C) 97.8 F (36.6 C) 97.7 F (36.5 C)   TempSrc:      SpO2: 96% 93% 94% 94%  Weight:      Height:        Intake/Output Summary (Last 24 hours) at 11/04/2021 1206 Last data filed at 11/04/2021 0100 Gross per 24 hour  Intake 960 ml  Output --  Net 960 ml   Filed Weights   11/01/21 1020  Weight: 81.6 kg    Examination:  General exam: Appears comfortable, not in any acute distress. Respiratory system: CTA bilaterally, normal respiratory effort, no accessory muscle use. Cardiovascular system: S1-S2 heard, regular rate and rhythm, no murmur. Gastrointestinal system: Abdomen is soft, non tender, non distended, BS+. Central nervous system: Alert and oriented x 3. No focal neurological deficits. Extremities: No edema, no cyanosis, no clubbing. Skin: No rashes, lesions or ulcers Psychiatry: Judgement and insight appear normal. Mood & affect appropriate.     Data Reviewed: I have personally reviewed following labs and imaging studies  CBC: Recent Labs  Lab 11/01/21 1024 11/02/21 0617 11/03/21 0439 11/04/21 0317  WBC 8.3 10.6* 16.8* 16.6*  HGB 15.6 15.0 15.0 14.2  HCT 46.2 44.1 44.8 43.4  MCV 90.4 88.6 90.3 90.8  PLT 185 187 184 [id number]   Basic Metabolic Panel: Recent Labs  Lab 11/01/21 1024 11/02/21 0617 11/03/21 0439 11/04/21 0317  NA 141 139 140 140  K 3.2* 4.1 4.4 4.1  CL 102 104 106 107  CO2 30 28 27 30   GLUCOSE 100* 126* 135* 99  BUN 24* 38* 48* 40*  CREATININE 1.31* 1.68* 1.69* 1.38*  CALCIUM 9.1 9.1 8.8* 8.5*  MG  --   --  2.3  --   PHOS  --   --  5.2*  --    GFR: Estimated Creatinine Clearance: 47.7 mL/min (A) (by C-G formula based on SCr of 1.38 mg/dL (H)). Liver Function Tests: No results for input(s): "AST", "ALT", "ALKPHOS", "BILITOT", "PROT", "ALBUMIN" in the  last 168 hours. No results for input(s): "LIPASE", "AMYLASE" in the last 168 hours. No results for input(s): "AMMONIA" in the last 168 hours. Coagulation Profile: No results for input(s): "INR", "PROTIME" in the last 168 hours. Cardiac Enzymes: No results for input(s): "CKTOTAL", "CKMB", "CKMBINDEX", "TROPONINI" in the last 168 hours. BNP (last 3 results) No results for input(s): "PROBNP" in the last 8760 hours. HbA1C: No results for input(s): "HGBA1C" in the last 72 hours. CBG: Recent Labs  Lab 11/03/21 0740 11/03/21 1218 11/03/21 1617 11/03/21 2123 11/04/21 0839  GLUCAP 116* 122* 144* 139* 86   Lipid Profile: No results for input(s): "CHOL", "HDL", "LDLCALC", "TRIG", "CHOLHDL", "LDLDIRECT" in the last 72 hours. Thyroid Function Tests: No results for input(s): "TSH", "T4TOTAL", "FREET4", "T3FREE", "THYROIDAB" in the last 72 hours. Anemia Panel: No results for input(s): "VITAMINB12", "FOLATE", "FERRITIN", "TIBC", "IRON", "RETICCTPCT" in the last 72 hours. Sepsis Labs: Recent Labs  Lab 11/01/21 1024  PROCALCITON <0.10    Recent Results (from the past 240 hour(s))  Resp Panel by RT-PCR (Flu  A&B, Covid) Anterior Nasal Swab     Status: None   Collection Time: 11/01/21 10:59 AM   Specimen: Anterior Nasal Swab  Result Value Ref Range Status   SARS Coronavirus 2 by RT PCR NEGATIVE NEGATIVE Final    Comment: (NOTE) SARS-CoV-2 target nucleic acids are NOT DETECTED.  The SARS-CoV-2 RNA is generally detectable in upper respiratory specimens during the acute phase of infection. The lowest concentration of SARS-CoV-2 viral copies this assay can detect is 138 copies/mL. A negative result does not preclude SARS-Cov-2 infection and should not be used as the sole basis for treatment or other patient management decisions. A negative result may occur with  improper specimen collection/handling, submission of specimen other than nasopharyngeal swab, presence of viral mutation(s) within  the areas targeted by this assay, and inadequate number of viral copies(<138 copies/mL). A negative result must be combined with clinical observations, patient history, and epidemiological information. The expected result is Negative.  Fact Sheet for Patients:  EntrepreneurPulse.com.au  Fact Sheet for Healthcare Providers:  IncredibleEmployment.be  This test is no t yet approved or cleared by the [place] FDA and  has been authorized for detection and/or diagnosis of SARS-CoV-2 by FDA under an Emergency Use Authorization (EUA). This EUA will remain  in effect (meaning this test can be used) for the duration of the COVID-19 declaration under Section 564(b)(1) of the Act, 21 U.S.C.section 360bbb-3(b)(1), unless the authorization is terminated  or revoked sooner.       Influenza A by PCR NEGATIVE NEGATIVE Final   Influenza B by PCR NEGATIVE NEGATIVE Final    Comment: (NOTE) The Xpert Xpress SARS-CoV-2/FLU/RSV plus assay is intended as an aid in the diagnosis of influenza from Nasopharyngeal swab specimens and should not be used as a sole basis for treatment. Nasal washings and aspirates are unacceptable for Xpert Xpress SARS-CoV-2/FLU/RSV testing.  Fact Sheet for Patients: EntrepreneurPulse.com.au  Fact Sheet for Healthcare Providers: IncredibleEmployment.be  This test is not yet approved or cleared by the [place] FDA and has been authorized for detection and/or diagnosis of SARS-CoV-2 by FDA under an Emergency Use Authorization (EUA). This EUA will remain in effect (meaning this test can be used) for the duration of the COVID-19 declaration under Section 564(b)(1) of the Act, 21 U.S.C. section 360bbb-3(b)(1), unless the authorization is terminated or revoked.  Performed at [facility], [place], [place] [address]   Respiratory (~20 pathogens) panel by PCR      Status: Abnormal   Collection Time: 11/03/21  5:15 PM   Specimen: Nasopharyngeal Swab; Respiratory  Result Value Ref Range Status   Adenovirus NOT DETECTED NOT DETECTED Final   Coronavirus 229E NOT DETECTED NOT DETECTED Final    Comment: (NOTE) The Coronavirus on the Respiratory Panel, DOES NOT test for the novel  Coronavirus (2019 nCoV)    Coronavirus HKU1 NOT DETECTED NOT DETECTED Final   Coronavirus NL63 NOT DETECTED NOT DETECTED Final   Coronavirus OC43 NOT DETECTED NOT DETECTED Final   Metapneumovirus NOT DETECTED NOT DETECTED Final   Rhinovirus / Enterovirus NOT DETECTED NOT DETECTED Final   Influenza A NOT DETECTED NOT DETECTED Final   Influenza B NOT DETECTED NOT DETECTED Final   Parainfluenza Virus 1 NOT DETECTED NOT DETECTED Final   Parainfluenza Virus 2 NOT DETECTED NOT DETECTED Final   Parainfluenza Virus 3 NOT DETECTED NOT DETECTED Final   Parainfluenza Virus 4 DETECTED (A) NOT DETECTED Final   Respiratory Syncytial Virus NOT DETECTED NOT DETECTED Final  Bordetella pertussis NOT DETECTED NOT DETECTED Final   Bordetella Parapertussis NOT DETECTED NOT DETECTED Final   Chlamydophila pneumoniae NOT DETECTED NOT DETECTED Final   Mycoplasma pneumoniae NOT DETECTED NOT DETECTED Final    Comment: Performed at [facility], [place] [address], [place], [place] [address]    Radiology Studies: CT CHEST W CONTRAST  Result Date: 11/02/2021 CLINICAL DATA:  Shortness of breath EXAM: CT CHEST WITH CONTRAST TECHNIQUE: Multidetector CT imaging of the chest was performed during intravenous contrast administration. RADIATION DOSE REDUCTION: This exam was performed according to the departmental dose-optimization program which includes automated exposure control, adjustment of the mA and/or kV according to patient size and/or use of iterative reconstruction technique. CONTRAST:  38mL OMNIPAQUE IOHEXOL 300 MG/ML  SOLN COMPARISON:  None Available. FINDINGS: Cardiovascular: No significant  vascular findings. Normal heart size. No pericardial effusion. Thoracic aortic atherosclerosis. Mediastinum/Nodes: No enlarged mediastinal, hilar, or axillary lymph nodes. Thyroid gland, trachea, and esophagus demonstrate no significant findings. Lungs/Pleura: 9 x 18 mm lobulated left lower lobe pulmonary nodule. Bibasilar scarring versus atelectasis. Centrilobular emphysema. 5 mm right upper lobe pulmonary nodule. Upper Abdomen: No acute abnormality. Nonobstructing 4 mm right renal calculus. Abdominal aortic atherosclerosis. Musculoskeletal: No acute osseous abnormality. No aggressive osseous lesion. 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 IMPRESSION: 1. A 9 x 18 mm lobulated left lower lobe pulmonary nodule. Findings are concerning for malignancy until proven otherwise. 5 mm right upper lobe pulmonary nodule. Consider one of the following in 3 months for both low-risk and high-risk individuals: (a) repeat chest CT, (b) follow-up PET-CT, or (c) tissue sampling. This recommendation follows the consensus statement: Guidelines for Management of Incidental Pulmonary Nodules Detected on CT Images: From the Fleischner Society 2017; Radiology 2017; 284:228-243. 2. 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 3. Nonobstructing 4 mm right renal calculus. 4. Bibasilar scarring versus atelectasis. 5. Aortic Atherosclerosis (ICD10-I70.0) and Emphysema (ICD10-J43.9). Electronically Signed   By: Kathreen Devoid M.D.   On: 11/02/2021 15:54    Scheduled Meds:  alfuzosin  10 mg Oral Daily   aspirin  81 mg Oral Daily   dutasteride  0.5 mg Oral Daily   enoxaparin (LOVENOX) injection  40 mg Subcutaneous QHS   fluticasone furoate-vilanterol  1 puff Inhalation Daily   And   umeclidinium bromide  1 puff Inhalation Daily   insulin aspart  0-20 Units Subcutaneous TID WC   insulin aspart  0-5 Units Subcutaneous  QHS   ipratropium-albuterol  3 mL Nebulization TID   irbesartan  150 mg Oral Daily   latanoprost  1 drop Both Eyes QHS   loratadine  10 mg Oral Daily   predniSONE  40 mg Oral Q breakfast   senna  1 tablet Oral BID   simvastatin  40 mg Oral q1800   verapamil  240 mg Oral Daily   Continuous Infusions:  azithromycin        LOS: 2 days    Time spent: 35 mins    Jisella Ashenfelter, MD [facility]   If 7PM-7AM, please contact night-coverage

## 2021-11-05 LAB — CBC
HCT: 44.7 % (ref 39.0–52.0)
Hemoglobin: 14.6 g/dL (ref 13.0–17.0)
MCH: 29.7 pg (ref 26.0–34.0)
MCHC: 32.7 g/dL (ref 30.0–36.0)
MCV: 90.9 fL (ref 80.0–100.0)
Platelets: 178 10*3/uL (ref 150–400)
RBC: 4.92 MIL/uL (ref 4.22–5.81)
RDW: 14.6 % (ref 11.5–15.5)
WBC: 11.9 10*3/uL — ABNORMAL HIGH (ref 4.0–10.5)
nRBC: 0 % (ref 0.0–0.2)

## 2021-11-05 LAB — BASIC METABOLIC PANEL
Anion gap: 4 — ABNORMAL LOW (ref 5–15)
BUN: 26 mg/dL — ABNORMAL HIGH (ref 8–23)
CO2: 33 mmol/L — ABNORMAL HIGH (ref 22–32)
Calcium: 8.9 mg/dL (ref 8.9–10.3)
Chloride: 105 mmol/L (ref 98–111)
Creatinine, Ser: 1 mg/dL (ref 0.61–1.24)
GFR, Estimated: >60 mL/min (ref >60)
Glucose, Bld: 78 mg/dL (ref 70–99)
Potassium: 3.8 mmol/L (ref 3.5–5.1)
Sodium: 142 mmol/L (ref 135–145)

## 2021-11-05 LAB — GLUCOSE, CAPILLARY: Glucose-Capillary: 76 mg/dL (ref 70–99)

## 2021-11-05 MED ORDER — LORATADINE 10 MG PO TABS: 10.0000 mg | ORAL_TABLET | Freq: Every day | ORAL | 1 refills | Status: DC

## 2021-11-05 MED ORDER — PREDNISONE 5 MG PO TABS: ORAL_TABLET | ORAL | 0 refills | Status: DC

## 2021-11-05 MED ORDER — AZITHROMYCIN 250 MG PO TABS
250.0000 mg | ORAL_TABLET | Freq: Every day | ORAL | 0 refills | Status: DC
Start: 2021-11-05 — End: 2021-11-10

## 2021-11-05 NOTE — Discharge Summary (Signed)
Physician Discharge Summary  Bryan Mcclain [id number] DOB: May 24, 1946 DOA: 11/01/2021  PCP: Rusty Aus, MD  Admit date: 11/01/2021  Discharge date: 11/05/2021  Admitted From: Home Disposition:  Home  Recommendations for Outpatient Follow-up:  Follow up with PCP in 1-2 weeks Please obtain BMP/CBC in one week Advised to follow-up with pulmonology as scheduled. Advised to take Zithromax 250 mg daily for 5 days. Advised to take prednisone 50 mg daily and wean by 5 mg daily and then discontinue.  Home Health:None Equipment/Devices:Home oxygen  Discharge Condition: Stable CODE STATUS:Full code Diet recommendation: Heart Healthy   Brief Summary: This 76 years old male with history of COPD, hyperlipidemia, hypertension, B12 deficiency, non-insulin-dependent diabetes mellitus, CKD stage IIIa presented in the ED with complaints of shortness of breath for 1 to 2 days.  COVID and influenza negative. Patient was given DuoNeb x2, Solu-Medrol 125 mg once in the ED. Patient does not use oxygen at baseline.  He reports occasional productive cough of white phlegm. Patient was admitted for acute hypoxic respiratory failure secondary to COPD with acute exacerbation. Patient was continued on IV Solu-Medrol and nebulized bronchodilators.  CTA chest showed increase in size of lung nodule. Pulmonology was consulted,   recommended additional work up outpatient once stable.  Patient continues to get better,  he still required oxygen.  Home oxygen arranged.  Patient is cleared from pulmonology to be discharged.  Patient wants to be discharged.  Discharge Diagnoses:  Principal Problem:   Shortness of breath Active Problems:   Essential hypertension   Hyperlipidemia   Diabetes mellitus type 2, noninsulin dependent (HCC)   Stage 3a chronic kidney disease (CKD) (HCC)   COPD with acute exacerbation (HCC)  Acute hypoxic respiratory failure: Patient presented with hypoxia , SPO2 86% on room air , requiring 4  L of supplemental oxygen. Likely secondary to COPD exacerbation. BNP and troponin unremarkable.  Continue supplemental oxygen and wean as tolerated. Procalcitonin negative.  RVP + Parainfluenza ,  Continue Solu-Medrol, and nebulized inhalers. Solu-Medrol transitioned with prednisone.   COPD with acute exacerbation: Continue supplemental oxygen and wean as tolerated. Continue Trelegy Ellipta inhalation once daily. Initiated Solu-Medrol 40 mg IV q. twice daily, transitioned to prednisone. Continue Zithromax IV for 3 days. Discussed with pulmonology that patient can be discharged today on Zithromax 250 mg daily for 5 days and prednisone taper starting 50 mg daily weaning by 5 mg daily.   CKD stage IIIa: Serum creatinine at baseline.   Avoid nephrotoxic medications.   Type 2 diabetes: Hemoglobin A1c 3/23 was 5.8. Continue regular insulin sliding scale due to steroid dosing.   Hyperlipidemia: Continue simvastatin 40 mg daily.   Essential hypertension: Continue verapamil 40 mg daily Olmesartan 20 mg daily,   Lung nodule: CT chest showed  A 9 x 18 mm lobulated left lower lobe pulmonary nodule.  Findings are concerning for malignancy until proven otherwise. Pulmonology consulted recommended outpatient work-up.   Constipation: Patient report has not had a bowel movement in 3 days. Continue Senokot and Colace.   Patient offered tapwater enema , he declined.  Discharge Instructions  Discharge Instructions     Call MD for:  difficulty breathing, headache or visual disturbances   Complete by: As directed    Call MD for:  persistant dizziness or light-headedness   Complete by: As directed    Call MD for:  persistant nausea and vomiting   Complete by: As directed    Diet - low sodium heart healthy   Complete by:  As directed    Diet Carb Modified   Complete by: As directed    Discharge instructions   Complete by: As directed    Advised to follow-up with primary care physician in  1 week. Advised to follow-up with pulmonology as scheduled. Advised to take Zithromax 250 mg daily for 5 days. Advised to take prednisone 50 mg daily and wean by 5 mg daily until discontinued.   Increase activity slowly   Complete by: As directed       Allergies as of 11/05/2021       Reactions   Moxifloxacin Hives, Shortness Of Breath        Medication List     STOP taking these medications    naproxen sodium 220 MG tablet Commonly known as: ALEVE       TAKE these medications    albuterol (2.5 MG/3ML) 0.083% nebulizer solution Commonly known as: PROVENTIL Take 2.5 mg by nebulization 4 (four) times daily.   albuterol 108 (90 Base) MCG/ACT inhaler Commonly known as: VENTOLIN HFA Inhale 2 puffs into the lungs every 6 (six) hours as needed.   alfuzosin 10 MG 24 hr tablet Commonly known as: UROXATRAL Take 10 mg by mouth daily.   aspirin 81 MG chewable tablet Chew 81 mg by mouth daily.   azithromycin 250 MG tablet Commonly known as: Zithromax Z-Pak Take 1 tablet (250 mg total) by mouth daily for 5 days. Take 2 tablets (500 mg) on  Day 1,  followed by 1 tablet (250 mg) once daily on Days 2 through 5.   cetirizine 10 MG tablet Commonly known as: ZYRTEC Take 10 mg by mouth daily.   dutasteride 0.5 MG capsule Commonly known as: AVODART Take 0.5 mg by mouth daily.   ipratropium-albuterol 0.5-2.5 (3) MG/3ML Soln Commonly known as: DUONEB Take 3 mLs by nebulization 2 (two) times daily.   loratadine 10 MG tablet Commonly known as: CLARITIN Take 1 tablet (10 mg total) by mouth daily. Start taking on: November 06, 2021   Lumigan 0.01 % Soln Generic drug: bimatoprost Place 1 drop into both eyes every evening.   Melatonin 10 MG Caps Take 10 mg by mouth every evening.   olmesartan 20 MG tablet Commonly known as: BENICAR Take 20 mg by mouth daily.   predniSONE 5 MG tablet Commonly known as: DELTASONE Advised to take prednisone 50 mg daily and wean by 5 mg every  day.   simvastatin 40 MG tablet Commonly known as: ZOCOR Take 40 mg by mouth daily.   Trelegy Ellipta 100-62.5-25 MCG/ACT Aepb Generic drug: Fluticasone-Umeclidin-Vilant Inhale 1 puff into the lungs daily.   verapamil 240 MG 24 hr capsule Commonly known as: VERELAN PM Take 240 mg by mouth daily.               Durable Medical Equipment  (From admission, onward)           Start     Ordered   11/03/21 1430  For home use only DME oxygen  Once       Question Answer Comment  Length of Need Lifetime   Mode or (Route) Nasal cannula   Liters per Minute 2   Frequency Continuous (stationary and portable oxygen unit needed)   Oxygen conserving device Yes   Oxygen delivery system Gas      11/03/21 1429            Follow-up Information     Danella Penton, MD Follow up in 1 week(s).  Specialty: Internal Medicine Contact information: [place] [address] [phone]                Allergies  Allergen Reactions   Moxifloxacin Hives and Shortness Of Breath    Consultations: Pulmonology   Procedures/Studies: CT CHEST W CONTRAST  Result Date: 11/02/2021 CLINICAL DATA:  Shortness of breath EXAM: CT CHEST WITH CONTRAST TECHNIQUE: Multidetector CT imaging of the chest was performed during intravenous contrast administration. RADIATION DOSE REDUCTION: This exam was performed according to the departmental dose-optimization program which includes automated exposure control, adjustment of the mA and/or kV according to patient size and/or use of iterative reconstruction technique. CONTRAST:  34mL OMNIPAQUE IOHEXOL 300 MG/ML  SOLN COMPARISON:  None Available. FINDINGS: Cardiovascular: No significant vascular findings. Normal heart size. No pericardial effusion. Thoracic aortic atherosclerosis. Mediastinum/Nodes: No enlarged mediastinal, hilar, or axillary lymph nodes. Thyroid gland, trachea, and esophagus  demonstrate no significant findings. Lungs/Pleura: 9 x 18 mm lobulated left lower lobe pulmonary nodule. Bibasilar scarring versus atelectasis. Centrilobular emphysema. 5 mm right upper lobe pulmonary nodule. Upper Abdomen: No acute abnormality. Nonobstructing 4 mm right renal calculus. Abdominal aortic atherosclerosis. Musculoskeletal: No acute osseous abnormality. No aggressive osseous lesion. 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 IMPRESSION: 1. A 9 x 18 mm lobulated left lower lobe pulmonary nodule. Findings are concerning for malignancy until proven otherwise. 5 mm right upper lobe pulmonary nodule. Consider one of the following in 3 months for both low-risk and high-risk individuals: (a) repeat chest CT, (b) follow-up PET-CT, or (c) tissue sampling. This recommendation follows the consensus statement: Guidelines for Management of Incidental Pulmonary Nodules Detected on CT Images: From the Fleischner Society 2017; Radiology 2017; 284:228-243. 2. 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 3. Nonobstructing 4 mm right renal calculus. 4. Bibasilar scarring versus atelectasis. 5. Aortic Atherosclerosis (ICD10-I70.0) and Emphysema (ICD10-J43.9). Electronically Signed   By: Kathreen Devoid M.D.   On: 11/02/2021 15:54   DG Chest 2 View  Result Date: 11/01/2021 CLINICAL DATA:  Shortness of breath EXAM: CHEST - 2 VIEW COMPARISON:  None Available. FINDINGS: Background changes of COPD with likely chronic interstitial prominence. No consolidation or edema. No pleural effusion or pneumothorax. Normal heart size. IMPRESSION: COPD.  No acute process in the chest. Electronically Signed   By: Macy Mis M.D.   On: 11/01/2021 10:48      Subjective: Patient was seen and examined at bedside.  Overnight events noted.   Patient reports feeling much improved.  Wants to be discharged.  Discharge  Exam: Vitals:   11/05/21 0838 11/05/21 0919  BP:  134/77  Pulse:  78  Resp:  20  Temp:  98.7 F (37.1 C)  SpO2: 91% 91%   Vitals:   11/04/21 2210 11/05/21 0459 11/05/21 0838 11/05/21 0919  BP: (!) 167/87 (!) 157/86  134/77  Pulse: 84 64  78  Resp: 16 16  20   Temp: 98.2 F (36.8 C) 98.2 F (36.8 C)  98.7 F (37.1 C)  TempSrc: Oral Oral    SpO2: 97% 94% 91% 91%  Weight:      Height:        General: Pt is alert, awake, not in acute distress Cardiovascular: RRR, S1/S2 +, no rubs, no gallops Respiratory: CTA bilaterally, no wheezing, no rhonchi Abdominal: Soft, NT, ND, bowel sounds + Extremities: no edema, no  cyanosis    The results of significant diagnostics from this hospitalization (including imaging, microbiology, ancillary and laboratory) are listed below for reference.     Microbiology: Recent Results (from the past 240 hour(s))  Resp Panel by RT-PCR (Flu A&B, Covid) Anterior Nasal Swab     Status: None   Collection Time: 11/01/21 10:59 AM   Specimen: Anterior Nasal Swab  Result Value Ref Range Status   SARS Coronavirus 2 by RT PCR NEGATIVE NEGATIVE Final    Comment: (NOTE) SARS-CoV-2 target nucleic acids are NOT DETECTED.  The SARS-CoV-2 RNA is generally detectable in upper respiratory specimens during the acute phase of infection. The lowest concentration of SARS-CoV-2 viral copies this assay can detect is 138 copies/mL. A negative result does not preclude SARS-Cov-2 infection and should not be used as the sole basis for treatment or other patient management decisions. A negative result may occur with  improper specimen collection/handling, submission of specimen other than nasopharyngeal swab, presence of viral mutation(s) within the areas targeted by this assay, and inadequate number of viral copies(<138 copies/mL). A negative result must be combined with clinical observations, patient history, and epidemiological information. The expected result is  Negative.  Fact Sheet for Patients:  EntrepreneurPulse.com.au  Fact Sheet for Healthcare Providers:  IncredibleEmployment.be  This test is no t yet approved or cleared by the [place] FDA and  has been authorized for detection and/or diagnosis of SARS-CoV-2 by FDA under an Emergency Use Authorization (EUA). This EUA will remain  in effect (meaning this test can be used) for the duration of the COVID-19 declaration under Section 564(b)(1) of the Act, 21 U.S.C.section 360bbb-3(b)(1), unless the authorization is terminated  or revoked sooner.       Influenza A by PCR NEGATIVE NEGATIVE Final   Influenza B by PCR NEGATIVE NEGATIVE Final    Comment: (NOTE) The Xpert Xpress SARS-CoV-2/FLU/RSV plus assay is intended as an aid in the diagnosis of influenza from Nasopharyngeal swab specimens and should not be used as a sole basis for treatment. Nasal washings and aspirates are unacceptable for Xpert Xpress SARS-CoV-2/FLU/RSV testing.  Fact Sheet for Patients: EntrepreneurPulse.com.au  Fact Sheet for Healthcare Providers: IncredibleEmployment.be  This test is not yet approved or cleared by the [place] FDA and has been authorized for detection and/or diagnosis of SARS-CoV-2 by FDA under an Emergency Use Authorization (EUA). This EUA will remain in effect (meaning this test can be used) for the duration of the COVID-19 declaration under Section 564(b)(1) of the Act, 21 U.S.C. section 360bbb-3(b)(1), unless the authorization is terminated or revoked.  Performed at [facility], [place], [place] [address]   Respiratory (~20 pathogens) panel by PCR     Status: Abnormal   Collection Time: 11/03/21  5:15 PM   Specimen: Nasopharyngeal Swab; Respiratory  Result Value Ref Range Status   Adenovirus NOT DETECTED NOT DETECTED Final   Coronavirus 229E NOT DETECTED NOT DETECTED Final     Comment: (NOTE) The Coronavirus on the Respiratory Panel, DOES NOT test for the novel  Coronavirus (2019 nCoV)    Coronavirus HKU1 NOT DETECTED NOT DETECTED Final   Coronavirus NL63 NOT DETECTED NOT DETECTED Final   Coronavirus OC43 NOT DETECTED NOT DETECTED Final   Metapneumovirus NOT DETECTED NOT DETECTED Final   Rhinovirus / Enterovirus NOT DETECTED NOT DETECTED Final   Influenza A NOT DETECTED NOT DETECTED Final   Influenza B NOT DETECTED NOT DETECTED Final   Parainfluenza Virus 1 NOT DETECTED NOT DETECTED  Final   Parainfluenza Virus 2 NOT DETECTED NOT DETECTED Final   Parainfluenza Virus 3 NOT DETECTED NOT DETECTED Final   Parainfluenza Virus 4 DETECTED (A) NOT DETECTED Final   Respiratory Syncytial Virus NOT DETECTED NOT DETECTED Final   Bordetella pertussis NOT DETECTED NOT DETECTED Final   Bordetella Parapertussis NOT DETECTED NOT DETECTED Final   Chlamydophila pneumoniae NOT DETECTED NOT DETECTED Final   Mycoplasma pneumoniae NOT DETECTED NOT DETECTED Final    Comment: Performed at [facility], [place] [address], [place], [place] [address]     Labs: BNP (last 3 results) Recent Labs    11/01/21 1024  BNP [id number]   Basic Metabolic Panel: Recent Labs  Lab 11/01/21 1024 11/02/21 0617 11/03/21 0439 11/04/21 0317 11/05/21 0621  NA 141 139 140 140 142  K 3.2* 4.1 4.4 4.1 3.8  CL 102 104 106 107 105  CO2 30 28 27 30  33*  GLUCOSE 100* 126* 135* 99 78  BUN 24* 38* 48* 40* 26*  CREATININE 1.31* 1.68* 1.69* 1.38* 1.00  CALCIUM 9.1 9.1 8.8* 8.5* 8.9  MG  --   --  2.3  --   --   PHOS  --   --  5.2*  --   --    Liver Function Tests: No results for input(s): "AST", "ALT", "ALKPHOS", "BILITOT", "PROT", "ALBUMIN" in the last 168 hours. No results for input(s): "LIPASE", "AMYLASE" in the last 168 hours. No results for input(s): "AMMONIA" in the last 168 hours. CBC: Recent Labs  Lab 11/01/21 1024 11/02/21 0617 11/03/21 0439 11/04/21 0317 11/05/21 0621  WBC 8.3 10.6*  16.8* 16.6* 11.9*  HGB 15.6 15.0 15.0 14.2 14.6  HCT 46.2 44.1 44.8 43.4 44.7  MCV 90.4 88.6 90.3 90.8 90.9  PLT 185 187 184 187 178   Cardiac Enzymes: No results for input(s): "CKTOTAL", "CKMB", "CKMBINDEX", "TROPONINI" in the last 168 hours. BNP: Invalid input(s): "POCBNP" CBG: Recent Labs  Lab 11/04/21 0839 11/04/21 1235 11/04/21 1640 11/04/21 2211 11/05/21 0722  GLUCAP 86 121* 103* 96 76   D-Dimer No results for input(s): "DDIMER" in the last 72 hours. Hgb A1c No results for input(s): "HGBA1C" in the last 72 hours. Lipid Profile No results for input(s): "CHOL", "HDL", "LDLCALC", "TRIG", "CHOLHDL", "LDLDIRECT" in the last 72 hours. Thyroid function studies No results for input(s): "TSH", "T4TOTAL", "T3FREE", "THYROIDAB" in the last 72 hours.  Invalid input(s): "FREET3" Anemia work up No results for input(s): "VITAMINB12", "FOLATE", "FERRITIN", "TIBC", "IRON", "RETICCTPCT" in the last 72 hours. Urinalysis No results found for: "COLORURINE", "APPEARANCEUR", "LABSPEC", "PHURINE", "GLUCOSEU", "HGBUR", "BILIRUBINUR", "KETONESUR", "PROTEINUR", "UROBILINOGEN", "NITRITE", "LEUKOCYTESUR" Sepsis Labs Recent Labs  Lab 11/02/21 0617 11/03/21 0439 11/04/21 0317 11/05/21 0621  WBC 10.6* 16.8* 16.6* 11.9*   Microbiology Recent Results (from the past 240 hour(s))  Resp Panel by RT-PCR (Flu A&B, Covid) Anterior Nasal Swab     Status: None   Collection Time: 11/01/21 10:59 AM   Specimen: Anterior Nasal Swab  Result Value Ref Range Status   SARS Coronavirus 2 by RT PCR NEGATIVE NEGATIVE Final    Comment: (NOTE) SARS-CoV-2 target nucleic acids are NOT DETECTED.  The SARS-CoV-2 RNA is generally detectable in upper respiratory specimens during the acute phase of infection. The lowest concentration of SARS-CoV-2 viral copies this assay can detect is 138 copies/mL. A negative result does not preclude SARS-Cov-2 infection and should not be used as the sole basis for treatment  or other patient management decisions. A negative result may occur  with  improper specimen collection/handling, submission of specimen other than nasopharyngeal swab, presence of viral mutation(s) within the areas targeted by this assay, and inadequate number of viral copies(<138 copies/mL). A negative result must be combined with clinical observations, patient history, and epidemiological information. The expected result is Negative.  Fact Sheet for Patients:  EntrepreneurPulse.com.au  Fact Sheet for Healthcare Providers:  IncredibleEmployment.be  This test is no t yet approved or cleared by the [place] FDA and  has been authorized for detection and/or diagnosis of SARS-CoV-2 by FDA under an Emergency Use Authorization (EUA). This EUA will remain  in effect (meaning this test can be used) for the duration of the COVID-19 declaration under Section 564(b)(1) of the Act, 21 U.S.C.section 360bbb-3(b)(1), unless the authorization is terminated  or revoked sooner.       Influenza A by PCR NEGATIVE NEGATIVE Final   Influenza B by PCR NEGATIVE NEGATIVE Final    Comment: (NOTE) The Xpert Xpress SARS-CoV-2/FLU/RSV plus assay is intended as an aid in the diagnosis of influenza from Nasopharyngeal swab specimens and should not be used as a sole basis for treatment. Nasal washings and aspirates are unacceptable for Xpert Xpress SARS-CoV-2/FLU/RSV testing.  Fact Sheet for Patients: EntrepreneurPulse.com.au  Fact Sheet for Healthcare Providers: IncredibleEmployment.be  This test is not yet approved or cleared by the [place] FDA and has been authorized for detection and/or diagnosis of SARS-CoV-2 by FDA under an Emergency Use Authorization (EUA). This EUA will remain in effect (meaning this test can be used) for the duration of the COVID-19 declaration under Section 564(b)(1) of the Act, 21 U.S.C. section  360bbb-3(b)(1), unless the authorization is terminated or revoked.  Performed at [facility], [place], [place] [address]   Respiratory (~20 pathogens) panel by PCR     Status: Abnormal   Collection Time: 11/03/21  5:15 PM   Specimen: Nasopharyngeal Swab; Respiratory  Result Value Ref Range Status   Adenovirus NOT DETECTED NOT DETECTED Final   Coronavirus 229E NOT DETECTED NOT DETECTED Final    Comment: (NOTE) The Coronavirus on the Respiratory Panel, DOES NOT test for the novel  Coronavirus (2019 nCoV)    Coronavirus HKU1 NOT DETECTED NOT DETECTED Final   Coronavirus NL63 NOT DETECTED NOT DETECTED Final   Coronavirus OC43 NOT DETECTED NOT DETECTED Final   Metapneumovirus NOT DETECTED NOT DETECTED Final   Rhinovirus / Enterovirus NOT DETECTED NOT DETECTED Final   Influenza A NOT DETECTED NOT DETECTED Final   Influenza B NOT DETECTED NOT DETECTED Final   Parainfluenza Virus 1 NOT DETECTED NOT DETECTED Final   Parainfluenza Virus 2 NOT DETECTED NOT DETECTED Final   Parainfluenza Virus 3 NOT DETECTED NOT DETECTED Final   Parainfluenza Virus 4 DETECTED (A) NOT DETECTED Final   Respiratory Syncytial Virus NOT DETECTED NOT DETECTED Final   Bordetella pertussis NOT DETECTED NOT DETECTED Final   Bordetella Parapertussis NOT DETECTED NOT DETECTED Final   Chlamydophila pneumoniae NOT DETECTED NOT DETECTED Final   Mycoplasma pneumoniae NOT DETECTED NOT DETECTED Final    Comment: Performed at [facility], [place]. [address], [place], [place] [address]     Time coordinating discharge: Over 30 minutes  SIGNED:   Shawna Clamp, MD  [facility] 11/05/2021, 3:16 PM Pager   If 7PM-7AM, please contact night-coverage

## 2021-11-05 NOTE — Discharge Instructions (Signed)
Advised to follow-up with primary care physician in 1 week. Advised to follow-up with pulmonology as scheduled. Advised to take Zithromax 250 mg daily for 5 days. Advised to take prednisone 50 mg daily and wean by 5 mg daily until discontinued.

## 2021-11-05 NOTE — TOC Progression Note (Signed)
Transition of Care [facility]) - Progression Note    Patient Details  Name: Bryan Mcclain MRN: [id number] Date of Birth: [birth year]  [facility] CM/SW Contact  Bing Quarry, RN Phone Number: 11/05/2021, 10:04 AM  Clinical Narrative:  6/10: Confirmed oxygen was delivered and present at bedside for discharge to home today. Gabriel Cirri RN CM           Expected Discharge Plan and Services           Expected Discharge Date: 11/05/21                                     Social Determinants of Health (SDOH) Interventions    Readmission Risk Interventions     No data to display

## 2021-11-08 ENCOUNTER — Emergency Department: Payer: Medicare PPO

## 2021-11-08 ENCOUNTER — Inpatient Hospital Stay
Admission: EM | Admit: 2021-11-08 | Discharge: 2021-11-10 | DRG: 193 | Disposition: A | Discharge status: Home / Self Care | Payer: Medicare PPO | Attending: Internal Medicine | Admitting: Internal Medicine

## 2021-11-08 ENCOUNTER — Other Ambulatory Visit: Payer: Self-pay

## 2021-11-08 ENCOUNTER — Encounter: Payer: Self-pay | Admitting: Intensive Care

## 2021-11-08 DIAGNOSIS — R0902 Hypoxemia: Principal | ICD-10-CM

## 2021-11-08 DIAGNOSIS — I129 Hypertensive chronic kidney disease with stage 1 through stage 4 chronic kidney disease, or unspecified chronic kidney disease: Secondary | ICD-10-CM | POA: Diagnosis present

## 2021-11-08 DIAGNOSIS — I700 Atherosclerosis of aorta: Secondary | ICD-10-CM | POA: Diagnosis present

## 2021-11-08 DIAGNOSIS — Z79899 Other long term (current) drug therapy: Secondary | ICD-10-CM | POA: Diagnosis not present

## 2021-11-08 DIAGNOSIS — I2510 Atherosclerotic heart disease of native coronary artery without angina pectoris: Secondary | ICD-10-CM | POA: Diagnosis present

## 2021-11-08 DIAGNOSIS — J9601 Acute respiratory failure with hypoxia: Secondary | ICD-10-CM | POA: Diagnosis present

## 2021-11-08 DIAGNOSIS — J189 Pneumonia, unspecified organism: Secondary | ICD-10-CM | POA: Diagnosis not present

## 2021-11-08 DIAGNOSIS — N183 Chronic kidney disease, stage 3 unspecified: Secondary | ICD-10-CM | POA: Diagnosis present

## 2021-11-08 DIAGNOSIS — N1831 Chronic kidney disease, stage 3a: Secondary | ICD-10-CM | POA: Diagnosis present

## 2021-11-08 DIAGNOSIS — E1122 Type 2 diabetes mellitus with diabetic chronic kidney disease: Secondary | ICD-10-CM | POA: Diagnosis present

## 2021-11-08 DIAGNOSIS — J441 Chronic obstructive pulmonary disease with (acute) exacerbation: Secondary | ICD-10-CM | POA: Diagnosis present

## 2021-11-08 DIAGNOSIS — K8020 Calculus of gallbladder without cholecystitis without obstruction: Secondary | ICD-10-CM | POA: Diagnosis present

## 2021-11-08 DIAGNOSIS — F17210 Nicotine dependence, cigarettes, uncomplicated: Secondary | ICD-10-CM | POA: Diagnosis present

## 2021-11-08 DIAGNOSIS — Z7982 Long term (current) use of aspirin: Secondary | ICD-10-CM | POA: Diagnosis not present

## 2021-11-08 DIAGNOSIS — R042 Hemoptysis: Secondary | ICD-10-CM

## 2021-11-08 DIAGNOSIS — J122 Parainfluenza virus pneumonia: Secondary | ICD-10-CM | POA: Diagnosis present

## 2021-11-08 DIAGNOSIS — F17200 Nicotine dependence, unspecified, uncomplicated: Secondary | ICD-10-CM | POA: Insufficient documentation

## 2021-11-08 DIAGNOSIS — Z833 Family history of diabetes mellitus: Secondary | ICD-10-CM

## 2021-11-08 DIAGNOSIS — Z881 Allergy status to other antibiotic agents status: Secondary | ICD-10-CM

## 2021-11-08 DIAGNOSIS — E785 Hyperlipidemia, unspecified: Secondary | ICD-10-CM | POA: Diagnosis present

## 2021-11-08 DIAGNOSIS — Z7951 Long term (current) use of inhaled steroids: Secondary | ICD-10-CM | POA: Diagnosis not present

## 2021-11-08 DIAGNOSIS — J432 Centrilobular emphysema: Secondary | ICD-10-CM | POA: Diagnosis present

## 2021-11-08 DIAGNOSIS — I10 Essential (primary) hypertension: Secondary | ICD-10-CM | POA: Diagnosis present

## 2021-11-08 LAB — GLUCOSE, CAPILLARY
Glucose-Capillary: 130 mg/dL — ABNORMAL HIGH (ref 70–99)
Glucose-Capillary: 118 mg/dL — ABNORMAL HIGH (ref 70–99)

## 2021-11-08 LAB — BASIC METABOLIC PANEL
Anion gap: 6 (ref 5–15)
BUN: 28 mg/dL — ABNORMAL HIGH (ref 8–23)
CO2: 31 mmol/L (ref 22–32)
Calcium: 9 mg/dL (ref 8.9–10.3)
Chloride: 101 mmol/L (ref 98–111)
Creatinine, Ser: 1.29 mg/dL — ABNORMAL HIGH (ref 0.61–1.24)
GFR, Estimated: 57 mL/min — ABNORMAL LOW (ref >60)
Glucose, Bld: 98 mg/dL (ref 70–99)
Potassium: 3.5 mmol/L (ref 3.5–5.1)
Sodium: 138 mmol/L (ref 135–145)

## 2021-11-08 LAB — CBC
HCT: 45.9 % (ref 39.0–52.0)
Hemoglobin: 15.7 g/dL (ref 13.0–17.0)
MCH: 30.3 pg (ref 26.0–34.0)
MCHC: 34.2 g/dL (ref 30.0–36.0)
MCV: 88.6 fL (ref 80.0–100.0)
Platelets: 195 10*3/uL (ref 150–400)
RBC: 5.18 MIL/uL (ref 4.22–5.81)
RDW: 13.9 % (ref 11.5–15.5)
WBC: 11.9 10*3/uL — ABNORMAL HIGH (ref 4.0–10.5)
nRBC: 0 % (ref 0.0–0.2)

## 2021-11-08 LAB — HEMOGLOBIN A1C
Hgb A1c MFr Bld: 5.8 % — ABNORMAL HIGH (ref 4.8–5.6)
Mean Plasma Glucose: 119.76 mg/dL

## 2021-11-08 LAB — TROPONIN I (HIGH SENSITIVITY): Troponin I (High Sensitivity): 5 ng/L (ref <18)

## 2021-11-08 LAB — CBG MONITORING, ED: Glucose-Capillary: 139 mg/dL — ABNORMAL HIGH (ref 70–99)

## 2021-11-08 MED ORDER — SALINE SPRAY 0.65 % NA SOLN
1.0000 | NASAL | Status: DC | PRN
Start: 2021-11-08 — End: 2021-11-10
  Administered 2021-11-08 – 2021-11-09 (×2): 1 via NASAL
  Filled 2021-11-08: qty 44

## 2021-11-08 MED ORDER — METHYLPREDNISOLONE SODIUM SUCC 40 MG IJ SOLR
40.0000 mg | INTRAMUSCULAR | Status: AC
  Administered 2021-11-09: 40 mg via INTRAVENOUS
  Filled 2021-11-08: qty 1

## 2021-11-08 MED ORDER — IPRATROPIUM-ALBUTEROL 0.5-2.5 (3) MG/3ML IN SOLN
3.0000 mL | Freq: Once | RESPIRATORY_TRACT | Status: AC
  Administered 2021-11-08: 3 mL via RESPIRATORY_TRACT
  Filled 2021-11-08: qty 3

## 2021-11-08 MED ORDER — DOXYCYCLINE HYCLATE 100 MG IV SOLR
100.0000 mg | Freq: Two times a day (BID) | INTRAVENOUS | Status: DC
  Administered 2021-11-08 – 2021-11-10 (×4): 100 mg via INTRAVENOUS
  Filled 2021-11-08 (×5): qty 100

## 2021-11-08 MED ORDER — UMECLIDINIUM BROMIDE 62.5 MCG/ACT IN AEPB
1.0000 | INHALATION_SPRAY | Freq: Every day | RESPIRATORY_TRACT | Status: DC
  Administered 2021-11-09 – 2021-11-10 (×2): 1 via RESPIRATORY_TRACT
  Filled 2021-11-08: qty 7

## 2021-11-08 MED ORDER — SODIUM CHLORIDE 0.9 % IV SOLN
2.0000 g | Freq: Two times a day (BID) | INTRAVENOUS | Status: DC
  Administered 2021-11-08 – 2021-11-10 (×4): 2 g via INTRAVENOUS
  Filled 2021-11-08: qty 2
  Filled 2021-11-08: qty 12.5
  Filled 2021-11-08: qty 2
  Filled 2021-11-08: qty 12.5

## 2021-11-08 MED ORDER — ONDANSETRON HCL 4 MG/2ML IJ SOLN: 4.0000 mg | Freq: Four times a day (QID) | INTRAMUSCULAR | Status: DC | PRN

## 2021-11-08 MED ORDER — IRBESARTAN 150 MG PO TABS
150.0000 mg | ORAL_TABLET | Freq: Every day | ORAL | Status: DC
  Administered 2021-11-08 – 2021-11-10 (×3): 150 mg via ORAL
  Filled 2021-11-08 (×3): qty 1

## 2021-11-08 MED ORDER — SODIUM CHLORIDE 0.9 % IV SOLN
2.0000 g | Freq: Once | INTRAVENOUS | Status: AC
  Administered 2021-11-08: 2 g via INTRAVENOUS
  Filled 2021-11-08: qty 12.5

## 2021-11-08 MED ORDER — DUTASTERIDE 0.5 MG PO CAPS
0.5000 mg | ORAL_CAPSULE | Freq: Every day | ORAL | Status: DC
  Administered 2021-11-08 – 2021-11-10 (×3): 0.5 mg via ORAL
  Filled 2021-11-08 (×3): qty 1

## 2021-11-08 MED ORDER — TRANEXAMIC ACID FOR INHALATION
500.0000 mg | Freq: Three times a day (TID) | RESPIRATORY_TRACT | Status: DC
  Administered 2021-11-08 – 2021-11-10 (×5): 500 mg via RESPIRATORY_TRACT
  Filled 2021-11-08 (×5): qty 5
  Filled 2021-11-08: qty 10
  Filled 2021-11-08: qty 5

## 2021-11-08 MED ORDER — INSULIN ASPART 100 UNIT/ML IJ SOLN
0.0000 [IU] | Freq: Three times a day (TID) | INTRAMUSCULAR | Status: DC
  Administered 2021-11-08 (×2): 2 [IU] via SUBCUTANEOUS
  Administered 2021-11-09: 3 [IU] via SUBCUTANEOUS
  Filled 2021-11-08 (×2): qty 1

## 2021-11-08 MED ORDER — TRANEXAMIC ACID FOR INHALATION
500.0000 mg | Freq: Once | RESPIRATORY_TRACT | Status: AC
Start: 2021-11-08 — End: 2021-11-08
  Administered 2021-11-08: 500 mg via RESPIRATORY_TRACT
  Filled 2021-11-08: qty 5

## 2021-11-08 MED ORDER — ALFUZOSIN HCL ER 10 MG PO TB24
10.0000 mg | ORAL_TABLET | Freq: Every day | ORAL | Status: DC
  Administered 2021-11-08 – 2021-11-10 (×3): 10 mg via ORAL
  Filled 2021-11-08 (×3): qty 1

## 2021-11-08 MED ORDER — ACETAMINOPHEN 325 MG PO TABS: 650.0000 mg | ORAL_TABLET | Freq: Four times a day (QID) | ORAL | Status: DC | PRN

## 2021-11-08 MED ORDER — ONDANSETRON HCL 4 MG PO TABS: 4.0000 mg | ORAL_TABLET | Freq: Four times a day (QID) | ORAL | Status: DC | PRN

## 2021-11-08 MED ORDER — SIMVASTATIN 20 MG PO TABS
40.0000 mg | ORAL_TABLET | Freq: Every evening | ORAL | Status: DC
  Administered 2021-11-08 – 2021-11-09 (×2): 40 mg via ORAL
  Filled 2021-11-08 (×2): qty 2

## 2021-11-08 MED ORDER — LATANOPROST 0.005 % OP SOLN
1.0000 [drp] | Freq: Every day | OPHTHALMIC | Status: DC
  Administered 2021-11-09: 1 [drp] via OPHTHALMIC
  Filled 2021-11-08: qty 2.5

## 2021-11-08 MED ORDER — TRANEXAMIC ACID FOR INHALATION
500.0000 mg | Freq: Once | RESPIRATORY_TRACT | Status: AC
  Administered 2021-11-08: 500 mg via RESPIRATORY_TRACT
  Filled 2021-11-08: qty 10

## 2021-11-08 MED ORDER — PREDNISONE 20 MG PO TABS
40.0000 mg | ORAL_TABLET | Freq: Every day | ORAL | Status: DC
  Administered 2021-11-10: 40 mg via ORAL
  Filled 2021-11-08: qty 2

## 2021-11-08 MED ORDER — FLUTICASONE FUROATE-VILANTEROL 100-25 MCG/ACT IN AEPB
1.0000 | INHALATION_SPRAY | Freq: Every day | RESPIRATORY_TRACT | Status: DC
  Administered 2021-11-09 – 2021-11-10 (×2): 1 via RESPIRATORY_TRACT
  Filled 2021-11-08: qty 28

## 2021-11-08 MED ORDER — VANCOMYCIN HCL IN DEXTROSE 1-5 GM/200ML-% IV SOLN
1000.0000 mg | Freq: Once | INTRAVENOUS | Status: AC
  Administered 2021-11-08: 1000 mg via INTRAVENOUS
  Filled 2021-11-08: qty 200

## 2021-11-08 MED ORDER — METHYLPREDNISOLONE SODIUM SUCC 125 MG IJ SOLR
125.0000 mg | Freq: Once | INTRAMUSCULAR | Status: AC
  Administered 2021-11-08: 125 mg via INTRAVENOUS
  Filled 2021-11-08: qty 2

## 2021-11-08 MED ORDER — INSULIN ASPART 100 UNIT/ML IJ SOLN
0.0000 [IU] | INTRAMUSCULAR | Status: DC
  Filled 2021-11-08: qty 1

## 2021-11-08 MED ORDER — ACETAMINOPHEN 650 MG RE SUPP: 650.0000 mg | Freq: Four times a day (QID) | RECTAL | Status: DC | PRN

## 2021-11-08 MED ORDER — IOHEXOL 350 MG/ML SOLN
75.0000 mL | Freq: Once | INTRAVENOUS | Status: AC | PRN
  Administered 2021-11-08: 75 mL via INTRAVENOUS

## 2021-11-08 MED ORDER — VERAPAMIL HCL ER 240 MG PO TBCR
240.0000 mg | EXTENDED_RELEASE_TABLET | Freq: Every day | ORAL | Status: DC
  Administered 2021-11-08 – 2021-11-10 (×3): 240 mg via ORAL
  Filled 2021-11-08 (×3): qty 1

## 2021-11-08 MED ORDER — IPRATROPIUM-ALBUTEROL 0.5-2.5 (3) MG/3ML IN SOLN
3.0000 mL | Freq: Four times a day (QID) | RESPIRATORY_TRACT | Status: DC | PRN
  Administered 2021-11-08 – 2021-11-09 (×2): 3 mL via RESPIRATORY_TRACT
  Filled 2021-11-08 (×2): qty 3

## 2021-11-08 NOTE — Assessment & Plan Note (Signed)
Patient has a history of diabetes mellitus type 2 with complications of stage IIIa chronic kidney disease Monitor renal function closely during this hospitalization Blood sugar checks every 4 hours while NPO

## 2021-11-08 NOTE — Assessment & Plan Note (Signed)
Secondary to recent infection with parainfluenza virus as well as acute COPD exacerbation Patient was discharged home on 3 to 4 L of oxygen but now has a new oxygen requirement and is on 5.5 L of oxygen to maintain pulse oximetry greater than 92% On 4 L he had pulse oximetry of 88% with increased work of breathing and use of accessory muscles. He appears comfortable on 5.5 L We will attempt to wean down to his baseline oxygen requirement versus this being a new requirement for him

## 2021-11-08 NOTE — ED Provider Notes (Signed)
[facility] Provider Note    Event Date/Time   First MD Initiated Contact with Patient 11/08/21 [phone]     (approximate)   History   Shortness of Breath and Hemoptysis   HPI  Bryan Mcclain is a 76 y.o. male  who, per discharge summary dated three days ago had admission for hypoxic respiratory failure and started on 4L supplemental oxygen, who presents to the emergency department today because of concern for hemoptysis.  The patient states that when he coughed this morning he noticed red blood on his shoulder.  Since discharge he actually feels like he is breathing slightly better.  His been taking his medications as prescribed.  He denies any chest pain.  Denies similar hemoptysis in the past.  Denies any fevers.      Physical Exam   Triage Vital Signs: ED Triage Vitals  Enc Vitals Group     BP 11/08/21 0721 (!) 167/69     Pulse Rate 11/08/21 0721 89     Resp 11/08/21 0721 (!) 26     Temp 11/08/21 0721 98 F (36.7 C)     Temp Source 11/08/21 0721 Oral     SpO2 11/08/21 0721 (!) 89 %     Weight 11/08/21 0722 185 lb (83.9 kg)     Height 11/08/21 0722 6' (1.829 m)     Head Circumference --      Peak Flow --      Pain Score 11/08/21 0722 0     Pain Loc --      Pain Edu? --      Excl. in Felt? --     Most recent vital signs: Vitals:   11/08/21 0721  BP: (!) 167/69  Pulse: 89  Resp: (!) 26  Temp: 98 F (36.7 C)  SpO2: (!) 89%    General: Awake, alert. Oriented. CV:  Good peripheral perfusion. Regular rate and rhythm. Resp:  Defuse expiratory wheezing. Prolonged expiratory phase. Abd:  No distention.   ED Results / Procedures / Treatments   Labs (all labs ordered are listed, but only abnormal results are displayed) Labs Reviewed  BASIC METABOLIC PANEL  CBC  TROPONIN I (HIGH SENSITIVITY)     EKG  I, Nance Pear, attending physician, personally viewed and interpreted this EKG  EKG Time: 0723 Rate: 73 Rhythm: sinus rhythm   Axis: normal Intervals: qtc 434 QRS: narrow, q waves v3 ST changes: no st elevation Impression: abnormal ekg   RADIOLOGY I independently interpreted and visualized the CXR. My interpretation: No pneumonia. No pneumothorax.  Radiology interpretation:  IMPRESSION:  COPD. No active cardiopulmonary disease.   I independently interpreted and visualized the CT angio PE. My interpretation: No large PE Radiology interpretation:  IMPRESSION:  1. Negative for acute pulmonary embolus.    2. 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  New confluent but mostly sub-solid opacity in the right lung apex  since last week is compatible with acute infection, inflammation, or  hemoptysis in this setting.  And progressive bilateral lower lung airway thickening and  opacification plus new right lower lobe tree-in-bud nodular opacity  compatible with acute distal airway infection.  No pleural effusion.    3. Unchanged left lung nodules, the largest in the lower lobe  abutting the diaphragm up to 18 mm as reported on 11/02/2021.  Consider one of the following in 3 months for both low-risk and  high-risk individuals: (a) repeat chest  CT, (b) follow-up PET-CT, or (c) tissue sampling. (  Fleisch[employer]  Society 2017; Radiology 2017; [phone]).    4. Cholelithiasis. 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      PROCEDURES:  Critical Care performed: No  Procedures   MEDICATIONS ORDERED IN ED: Medications - No data to display   IMPRESSION / MDM / [place] / ED COURSE  I reviewed the triage vital signs and the nursing notes.                              Differential diagnosis includes, but is not limited to, COPD excerebration, PNA, PE.  Patient's presentation is most consistent with acute presentation with potential threat to life or bodily function.  Patient presented to the emergency department today because of concerns for hemoptysis.   Patient was found to be hypoxic on 4 L of O2 so that was increased to 6 L.  Did have a recent hospitalization.  Given concerns for possible PE or infection a CT angio was obtained.  This did not show PE but was concerning for new infection.  Patient was ordered IV antibiotics.  Additionally given inhaled TXA to help with hemoptysis.  Discussed with Dr. Francine Graven with the hospitalist service who will plan on admission.  FINAL CLINICAL IMPRESSION(S) / ED DIAGNOSES   Final diagnoses:  Hypoxia  Hemoptysis    Note:  This document was prepared using Dragon voice recognition software and may include unintentional dictation errors.    Nance Pear, MD 11/08/21 [phone]

## 2021-11-08 NOTE — Consult Note (Signed)
Pharmacy Antibiotic Note  Bryan Mcclain is a 76 y.o. male admitted on 11/08/2021 with HCAP.  Pharmacy has been consulted for Cefepime dosing.  Plan: Give Cefepime 2g IV every 12 hours Continue to monitor renal function with daily labs and adjust per indication and renal function  Height: 6' (182.9 cm) Weight: 83.9 kg (185 lb) IBW/kg (Calculated) : 77.6  Temp (24hrs), Avg:98 F (36.7 C), Min:98 F (36.7 C), Max:98 F (36.7 C)  Recent Labs  Lab 11/02/21 0617 11/03/21 0439 11/04/21 0317 11/05/21 0621 11/08/21 0740  WBC 10.6* 16.8* 16.6* 11.9* 11.9*  CREATININE 1.68* 1.69* 1.38* 1.00 1.29*    Estimated Creatinine Clearance: 52.6 mL/min (A) (by C-G formula based on SCr of 1.29 mg/dL (H)).    Allergies  Allergen Reactions   Moxifloxacin Hives and Shortness Of Breath    Antimicrobials this admission: 6/13 Cefepime >>  6/13 Vancomycin x1 Ed   Microbiology results: None cultures ordered. Notified MD.    Thank you for allowing pharmacy to be a part of this patient's care.  Selinda Eon 11/08/2021 11:33 AM

## 2021-11-08 NOTE — H&P (Addendum)
History and Physical    Patient: Bryan Mcclain [id number] DOB: Oct 22, 1945 DOA: 11/08/2021 DOS: the patient was seen and examined on 11/08/2021 PCP: Rusty Aus, MD  Patient coming from: Home  Chief Complaint:  Chief Complaint  Patient presents with   Shortness of Breath   Hemoptysis   HPI: Bryan Mcclain is a 76 y.o. 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 He presents to the ER this morning for evaluation of hemoptysis that started in the early hours of the morning associated with worsening shortness of breath.  His wife states that she increased his oxygen from 3 to 4 L due to his worsening shortness of breath and when they arrived the ER on 4 L he had pulse oximetry of 88%. Patient describes a copious amount of hemoptysis and per the wife he was almost 1/2 of the emesis bag. He has had no fever, no chills, no chest pain, no dizziness, no lightheadedness, no headache, no abdominal pain, no nausea, no vomiting, no changes in his bowel habits, no melena stools or hematochezia, no blurred vision or focal deficit. Patient received nebulized tranexamic acid in the ER.  Review of Systems: As mentioned in the history of present illness. All other systems reviewed and are negative. Past Medical History:  Diagnosis Date   COPD (chronic obstructive pulmonary disease) ([place])    Hyperlipidemia    Hypertension    Past Surgical History:  Procedure Laterality Date    APPENDECTOMY     BACK SURGERY     CATARACT EXTRACTION     NASAL SEPTUM SURGERY     Social History:  reports that he has been smoking cigarettes. He has never used smokeless tobacco. He reports that he does not currently use alcohol. He reports that he does not currently use drugs.  Allergies  Allergen Reactions   Moxifloxacin Hives and Shortness Of Breath    Family History  Problem Relation Age of Onset   Diabetes Mother     Prior to Admission medications   Medication Sig Start Date End Date Taking? Authorizing Provider  albuterol (PROVENTIL) (2.5 MG/3ML) 0.083% nebulizer solution Take 2.5 mg by nebulization 4 (four) times daily. 07/13/21  Yes [provider]  alfuzosin (UROXATRAL) 10 MG 24 hr tablet Take 10 mg by mouth daily. 10/30/21  Yes [provider]  aspirin 81 MG chewable tablet Chew 81 mg by mouth daily.   Yes [provider]  dutasteride (AVODART) 0.5 MG capsule Take 0.5 mg by mouth daily. 10/30/21  Yes [provider]  ipratropium-albuterol (DUONEB) 0.5-2.5 (3) MG/3ML SOLN Take 3 mLs by nebulization 2 (two) times daily. 08/03/21  Yes [provider]  LUMIGAN 0.01 % SOLN Place 1 drop into both eyes every evening. 07/13/21  Yes [provider]  olmesartan (BENICAR) 20 MG tablet Take 20 mg by mouth daily. 08/03/21  Yes [provider]  predniSONE (DELTASONE) 5 MG tablet Advised to take prednisone 50 mg daily and wean by 5 mg every day. 11/05/21  Yes Shawna Clamp, MD  simvastatin (ZOCOR) 40  MG tablet Take 40 mg by mouth daily. 10/07/21  Yes [provider]  TRELEGY ELLIPTA 100-62.5-25 MCG/ACT AEPB Inhale 1 puff into the lungs daily. 10/30/21  Yes [provider]  verapamil (VERELAN PM) 240 MG 24 hr capsule Take 240 mg by mouth daily. 08/17/21  Yes [provider]  albuterol (VENTOLIN HFA) 108 (90 Base) MCG/ACT inhaler Inhale 2 puffs into the lungs every 6 (six) hours as needed. 10/12/21   [provider]  azithromycin (ZITHROMAX Z-PAK) 250 MG tablet Take 1 tablet (250 mg total) by mouth daily for 5 days. Take 2 tablets (500 mg) on  Day 1,  followed by 1 tablet (250 mg) once daily on Days 2 through 5. Patient not taking: Reported on 11/08/2021 11/05/21 11/10/21  Shawna Clamp, MD  cetirizine (ZYRTEC) 10 MG tablet Take 10 mg by mouth daily.    [provider]  loratadine (CLARITIN) 10 MG tablet Take 1 tablet (10 mg total) by mouth daily. 11/06/21   Shawna Clamp, MD  Melatonin 10 MG CAPS Take 10 mg by mouth every evening. Patient not taking: Reported on 11/08/2021    [provider]    Physical Exam: Vitals:   11/08/21 0722 11/08/21 0739 11/08/21 0830 11/08/21 1030  BP:  (!) 145/95 (!) 141/77 (!) 155/85  Pulse:  73 61 78  Resp:  13 11 17   Temp:      TempSrc:      SpO2:  99% 100% 96%  Weight: 83.9 kg     Height: 6' (1.829 m)      Physical Exam Vitals and nursing note reviewed.  Constitutional:      Appearance: He is well-developed.  HENT:     Head: Normocephalic and atraumatic.     Mouth/Throat:     Mouth: Mucous membranes are moist.     Pharynx: Oropharynx is clear.  Eyes:     Pupils: Pupils are equal, round, and reactive to light.  Cardiovascular:     Rate and Rhythm: Normal rate and regular rhythm.  Pulmonary:     Effort: Pulmonary effort is normal.     Breath sounds: Examination of the right-upper field reveals rhonchi. Examination of the left-upper field reveals rhonchi. Examination of the right-middle field reveals rhonchi. Examination of the left-middle field reveals rhonchi. Examination of the right-lower field reveals rhonchi. Examination of the left-lower field reveals rhonchi. Rhonchi present.  Abdominal:     Palpations: Abdomen is soft.  Musculoskeletal:        General: Normal range of motion.     Cervical back: Normal range of motion and neck supple.  Skin:    General: Skin is warm and dry.  Neurological:     General: No focal  deficit present.     Mental Status: He is alert.  Psychiatric:        Mood and Affect: Mood normal.        Behavior: Behavior normal.     Data Reviewed: Relevant notes from primary care and specialist visits, past discharge summaries as available in EHR, including Care Everywhere. Prior diagnostic testing as pertinent to current admission diagnoses Updated medications and problem lists for reconciliation ED course, including vitals, labs, imaging, treatment and response to treatment Triage notes, nursing and pharmacy notes and ED provider's notes Notable results as noted in HPI Labs reviewed.  White count 11.9, hemoglobin 15.7, hematocrit 45.9, MCV 88.6, platelet count 195, sodium 138, potassium 3.5, chloride 101, bicarb 31, glucose 98, BUN 28, creatinine 1.29,  calcium 9.0 Chest x-ray reviewed by me shows COPD with no evidence of acute cardiopulmonary disease CT angiogram of the chest is negative for acute pulmonary embolus. Chronic lung disease with Emphysema New confluent but mostly sub-solid opacity in the right lung apex since last week is compatible with acute infection, inflammation, or hemoptysis in this setting.And progressive bilateral lower lung airway thickening and opacification plus new right lower lobe tree-in-bud nodular opacity compatible with acute distal airway infection. No pleural effusion. Unchanged left lung nodules, the largest in the lower lobe abutting the diaphragm up to 18 mm as reported on 11/02/2021. Cholelithiasis. Calcified coronary artery and Aortic Atherosclerosis  There are no new results to review at this time.  Assessment and Plan: * CAP (community acquired pneumonia) Patient presents for acute onset hemoptysis associated with a cough CT angiogram of the chest shows new confluent but mostly sub-solid opacity in the right lung apex since last week is compatible with acute infection, inflammation, or hemoptysis in this setting. And progressive bilateral  lower lung airway thickening and opacification plus new right lower lobe tree-in-bud nodular opacity compatible with acute distal airway infection. Post viral pneumonia Patient was recently admitted to the hospital and treated with Rocephin and Zithromax.  He recently completed a course of Zithromax We will treat patient empirically with IV cefepime to cover pseudomonas Follow-up results of blood cultures   Cough with hemoptysis Patient presents for acute onset hemoptysis associated with a cough CT angiogram of the chest shows new confluent but mostly sub-solid opacity in the right lung apex since last week is compatible with acute infection, inflammation, or hemoptysis in this setting. And progressive bilateral lower lung airway thickening and opacification plus new right lower lobe tree-in-bud nodular opacity compatible with acute distal airway infection. Patient received nebulized tranexamic acid in the ER We will consult pulmonary Hold aspirin  Acute respiratory failure with hypoxia (HCC) Secondary to recent infection with parainfluenza virus as well as acute COPD exacerbation Patient was discharged home on 3 to 4 L of oxygen but now has a new oxygen requirement and is on 5.5 L of oxygen to maintain pulse oximetry greater than 92% On 4 L he had pulse oximetry of 88% with increased work of breathing and use of accessory muscles. He appears comfortable on 5.5 L We will attempt to wean down to his baseline oxygen requirement versus this being a new requirement for him  COPD with acute exacerbation ([place]) Patient with a known history of COPD recently admitted to the hospital for acute exacerbation related to parainfluenza infection Continue systemic steroids Continue as needed bronchodilator therapy Continue oxygen supplementation to maintain pulse oximetry greater than 92%  CKD stage 3 due to type 2 diabetes mellitus ([place]) Patient has a history of diabetes mellitus type 2 with  complications of stage IIIa chronic kidney disease Monitor renal function closely during this hospitalization Blood sugar checks every 4 hours while NPO  Essential hypertension Continue verapamil and Avapro      Advance Care Planning:   Code Status: Full Code   Consults: Pulmonary  Family Communication: Greater than 50% of time was spent discussing patient's condition and plan of care with him and his wife at the bedside.  All questions and concerns have been addressed.  They verbalized understanding and agree with the plan.  Severity of Illness: The appropriate patient status for this patient is INPATIENT. Inpatient status is judged to be reasonable and necessary in order to provide the required intensity of service to  ensure the patient's safety. The patient's presenting symptoms, physical exam findings, and initial radiographic and laboratory data in the context of their chronic comorbidities is felt to place them at high risk for further clinical deterioration. Furthermore, it is not anticipated that the patient will be medically stable for discharge from the hospital within 2 midnights of admission.   * I certify that at the point of admission it is my clinical judgment that the patient will require inpatient hospital care spanning beyond 2 midnights from the point of admission due to high intensity of service, high risk for further deterioration and high frequency of surveillance required.*  Author: Collier Bullock, MD 11/08/2021 1:09 PM  For on call review [email or web address].

## 2021-11-08 NOTE — ED Notes (Signed)
[place], MD at bedside. ?

## 2021-11-08 NOTE — Assessment & Plan Note (Signed)
Patient with a known history of COPD recently admitted to the hospital for acute exacerbation related to parainfluenza infection Continue systemic steroids Continue as needed bronchodilator therapy Continue oxygen supplementation to maintain pulse oximetry greater than 92%

## 2021-11-08 NOTE — ED Notes (Signed)
Patient at CT

## 2021-11-08 NOTE — Assessment & Plan Note (Addendum)
Patient presents for acute onset hemoptysis associated with a cough CT angiogram of the chest shows new confluent but mostly sub-solid opacity in the right lung apex since last week is compatible with acute infection, inflammation, or hemoptysis in this setting. And progressive bilateral lower lung airway thickening and opacification plus new right lower lobe tree-in-bud nodular opacity compatible with acute distal airway infection. Post viral pneumonia Patient was recently admitted to the hospital and treated with Rocephin and Zithromax.  He recently completed a course of Zithromax We will treat patient empirically with IV cefepime to cover pseudomonas Follow-up results of blood cultures

## 2021-11-08 NOTE — Assessment & Plan Note (Signed)
Continue verapamil and Avapro

## 2021-11-08 NOTE — Assessment & Plan Note (Signed)
Patient presents for acute onset hemoptysis associated with a cough CT angiogram of the chest shows new confluent but mostly sub-solid opacity in the right lung apex since last week is compatible with acute infection, inflammation, or hemoptysis in this setting. And progressive bilateral lower lung airway thickening and opacification plus new right lower lobe tree-in-bud nodular opacity compatible with acute distal airway infection. Patient received nebulized tranexamic acid in the ER We will consult pulmonary Hold aspirin

## 2021-11-08 NOTE — ED Triage Notes (Signed)
Patient presents with hemoptysis that started this AM and worsening sob. Recently admitted here on 11/01/21 for COPD flare up and placed on 4L O2 that is new for patient. Wife reports he was diagnosed with virus that would take weeks to get over and plan was to come back off oxygen.

## 2021-11-08 NOTE — ED Notes (Signed)
Pulmonology at bedside.

## 2021-11-08 NOTE — Progress Notes (Signed)
PULMONOLOGY         Date: 11/08/2021,   MRN# [id number] Devoe Esh 1946/02/09     AdmissionWeight: 83.9 kg                 CurrentWeight: 83.9 kg  Referring provider: Dr. Dwyane Dee   CHIEF COMPLAINT:   Possible neoplasm on CT chest   HISTORY OF PRESENT ILLNESS   This is a pleasant 76 year old male with a history of dyslipidemia, essential hypertension, B12 deficiency, diabetes, CKD 3, advanced COPD, came in with mild tachypnea and shortness of breath was found to have a normal D-dimer negative COVID-19 influenza a and B testing thought to have COPD exacerbation and treated with Zithromax IV as well as nebulizer and Solu-Medrol therapy.  He has not used oxygen in the past at home and does not report having sick contacts prior to admission he did lose approximately 20 to 30 pounds unintentionally over the last 6 months currently smoking daily.  Patient uses trilogy inhaler at home.  CT chest was performed with findings of centrilobular emphysema as well as lung nodules with possible lung cancer.  I independently reviewed CT chest with left lower lobe nodule which is pleural-based close to the diaphragm and is unlikely to be clinically significant at this time.   11/04/21- patient is on 4L/min reports constipation. Asking to go home. I think he can be dcd tommorow with supplemental O2.  I estimate recovery to take 3 wks.  Recommend to dc on zithromax 250 x 5 days and prednisone 50 with weaning by 5mg  per day. CT chest with previously noted lung nodules and new infiltrates due to infection vs hemoptysis noted.   11/08/21- patient re-admitted with hemoptysis.  He has received nebulized tXA with resolution of hemoptysis now.    NOT DETECTED NOT DETECTED   Parainfluenza Virus 1 NOT DETECTED NOT DETECTED   Parainfluenza Virus 2 NOT DETECTED NOT DETECTED   Parainfluenza Virus 3 NOT DETECTED NOT DETECTED   Parainfluenza Virus 4 NOT DETECTED DETECTED Abnormal    Respiratory Syncytial  Virus NOT DETECTED NOT DETECTED   Bordetella pertussis NOT DETECTED NOT DETECTED   Bordetella Parapertussis NOT DETECTED NOT DETECTED   Chlamydophila pneumoniae NOT DETECTED NOT DETECTED   Mycoplasma pneumoniae NOT DETECTED NOT DETECTED      PAST MEDICAL HISTORY   Past Medical History:  Diagnosis Date   COPD (chronic obstructive pulmonary disease) (HCC)    Hyperlipidemia    Hypertension      SURGICAL HISTORY   Past Surgical History:  Procedure Laterality Date   APPENDECTOMY     BACK SURGERY     CATARACT EXTRACTION     NASAL SEPTUM SURGERY       FAMILY HISTORY   Family History  Problem Relation Age of Onset   Diabetes Mother      SOCIAL HISTORY   Social History   Tobacco Use   Smoking status: Every Day    Types: Cigarettes   Smokeless tobacco: Never  Substance Use Topics   Alcohol use: Not Currently   Drug use: Not Currently     MEDICATIONS    Home Medication:    Current Medication:  Current Facility-Administered Medications:    acetaminophen (TYLENOL) tablet 650 mg, 650 mg, Oral, Q6H PRN **OR** acetaminophen (TYLENOL) suppository 650 mg, 650 mg, Rectal, Q6H PRN, Agbata, Tochukwu, MD   alfuzosin (UROXATRAL) 24 hr tablet 10 mg, 10 mg, Oral, Daily, Agbata, Tochukwu, MD   ceFEPIme (MAXIPIME) 2 g  in sodium chloride 0.9 % 100 mL IVPB, 2 g, Intravenous, Q12H, Selinda EonMoore, Anderson S, RPH   dutasteride (AVODART) capsule 0.5 mg, 0.5 mg, Oral, Daily, Agbata, Tochukwu, MD   [START ON 11/09/2021] fluticasone furoate-vilanterol (BREO ELLIPTA) 100-25 MCG/ACT 1 puff, 1 puff, Inhalation, Daily **AND** [START ON 11/09/2021] umeclidinium bromide (INCRUSE ELLIPTA) 62.5 MCG/ACT 1 puff, 1 puff, Inhalation, Daily, Agbata, Tochukwu, MD   insulin aspart (novoLOG) injection 0-15 Units, 0-15 Units, Subcutaneous, Q4H, Agbata, Tochukwu, MD   ipratropium-albuterol (DUONEB) 0.5-2.5 (3) MG/3ML nebulizer solution 3 mL, 3 mL, Nebulization, Q6H PRN, Agbata, Tochukwu, MD   irbesartan (AVAPRO)  tablet 150 mg, 150 mg, Oral, Daily, Agbata, Tochukwu, MD   [START ON 11/09/2021] latanoprost (XALATAN) 0.005 % ophthalmic solution 1 drop, 1 drop, Both Eyes, QHS, Agbata, Tochukwu, MD   [START ON 11/09/2021] methylPREDNISolone sodium succinate (SOLU-MEDROL) 40 mg/mL injection 40 mg, 40 mg, Intravenous, Q24H **FOLLOWED BY** [START ON 11/10/2021] predniSONE (DELTASONE) tablet 40 mg, 40 mg, Oral, Q breakfast, Agbata, Tochukwu, MD   ondansetron (ZOFRAN) tablet 4 mg, 4 mg, Oral, Q6H PRN **OR** ondansetron (ZOFRAN) injection 4 mg, 4 mg, Intravenous, Q6H PRN, Agbata, Tochukwu, MD   simvastatin (ZOCOR) tablet 40 mg, 40 mg, Oral, QPM, Agbata, Tochukwu, MD   verapamil (CALAN-SR) CR tablet 240 mg, 240 mg, Oral, Daily, Agbata, Tochukwu, MD  Current Outpatient Medications:    albuterol (PROVENTIL) (2.5 MG/3ML) 0.083% nebulizer solution, Take 2.5 mg by nebulization 4 (four) times daily., Disp: , Rfl:    alfuzosin (UROXATRAL) 10 MG 24 hr tablet, Take 10 mg by mouth daily., Disp: , Rfl:    aspirin 81 MG chewable tablet, Chew 81 mg by mouth daily., Disp: , Rfl:    dutasteride (AVODART) 0.5 MG capsule, Take 0.5 mg by mouth daily., Disp: , Rfl:    ipratropium-albuterol (DUONEB) 0.5-2.5 (3) MG/3ML SOLN, Take 3 mLs by nebulization 2 (two) times daily., Disp: , Rfl:    LUMIGAN 0.01 % SOLN, Place 1 drop into both eyes every evening., Disp: , Rfl:    olmesartan (BENICAR) 20 MG tablet, Take 20 mg by mouth daily., Disp: , Rfl:    predniSONE (DELTASONE) 5 MG tablet, Advised to take prednisone 50 mg daily and wean by 5 mg every day., Disp: 55 tablet, Rfl: 0   simvastatin (ZOCOR) 40 MG tablet, Take 40 mg by mouth daily., Disp: , Rfl:    TRELEGY ELLIPTA 100-62.5-25 MCG/ACT AEPB, Inhale 1 puff into the lungs daily., Disp: , Rfl:    verapamil (VERELAN PM) 240 MG 24 hr capsule, Take 240 mg by mouth daily., Disp: , Rfl:    albuterol (VENTOLIN HFA) 108 (90 Base) MCG/ACT inhaler, Inhale 2 puffs into the lungs every 6 (six) hours as  needed., Disp: , Rfl:    azithromycin (ZITHROMAX Z-PAK) 250 MG tablet, Take 1 tablet (250 mg total) by mouth daily for 5 days. Take 2 tablets (500 mg) on  Day 1,  followed by 1 tablet (250 mg) once daily on Days 2 through 5. (Patient not taking: Reported on 11/08/2021), Disp: 5 each, Rfl: 0   cetirizine (ZYRTEC) 10 MG tablet, Take 10 mg by mouth daily., Disp: , Rfl:    loratadine (CLARITIN) 10 MG tablet, Take 1 tablet (10 mg total) by mouth daily., Disp: 30 tablet, Rfl: 1   Melatonin 10 MG CAPS, Take 10 mg by mouth every evening. (Patient not taking: Reported on 11/08/2021), Disp: , Rfl:     ALLERGIES   Moxifloxacin     REVIEW OF SYSTEMS  Review of Systems:  Gen:  Denies  fever, sweats, chills weigh loss  HEENT: Denies blurred vision, double vision, ear pain, eye pain, hearing loss, nose bleeds, sore throat Cardiac:  No dizziness, chest pain or heaviness, chest tightness,edema Resp:   reports dyspnea chronically  Gi: Denies swallowing difficulty, stomach pain, nausea or vomiting, diarrhea, constipation, bowel incontinence Gu:  Denies bladder incontinence, burning urine Ext:   Denies Joint pain, stiffness or swelling Skin: Denies  skin rash, easy bruising or bleeding or hives Endoc:  Denies polyuria, polydipsia , polyphagia or weight change Psych:   Denies depression, insomnia or hallucinations   Other:  All other systems negative   VS: BP (!) 155/85   Pulse 78   Temp 98 F (36.7 C) (Oral)   Resp 17   Ht 6' (1.829 m)   Wt 83.9 kg   SpO2 96%   BMI 25.09 kg/m      PHYSICAL EXAM    GENERAL:NAD, no fevers, chills, no weakness no fatigue HEAD: Normocephalic, atraumatic.  EYES: Pupils equal, round, reactive to light. Extraocular muscles intact. No scleral icterus.  MOUTH: Moist mucosal membrane. Dentition intact. No abscess noted.  EAR, NOSE, THROAT: Clear without exudates. No external lesions.  NECK: Supple. No thyromegaly. No nodules. No JVD.  PULMONARY: decreased  breath sounds with mild rhonchi worse at bases bilaterally.  CARDIOVASCULAR: S1 and S2. Regular rate and rhythm. No murmurs, rubs, or gallops. No edema. Pedal pulses 2+ bilaterally.  GASTROINTESTINAL: Soft, nontender, nondistended. No masses. Positive bowel sounds. No hepatosplenomegaly.  MUSCULOSKELETAL: No swelling, clubbing, or edema. Range of motion full in all extremities.  NEUROLOGIC: Cranial nerves II through XII are intact. No gross focal neurological deficits. Sensation intact. Reflexes intact.  SKIN: No ulceration, lesions, rashes, or cyanosis. Skin warm and dry. Turgor intact.  PSYCHIATRIC: Mood, affect within normal limits. The patient is awake, alert and oriented x 3. Insight, judgment intact.       IMAGING   CT chest done on admission with findings of centrilobular emphysema and small nodules worse at the left lower lobe close to the diaphragm abutting the pleura without spiculated margins.  ASSESSMENT/PLAN   S/p Acute exacerbation of COPD -Perform respiratory viral panel- parainfluenza 4+ -COVID-19 influenza a and B are both negative -Agree with current COPD care path with steroids and antimicrobials  Secondary community acquired pneumonia   Post viral bacterial pneumonia with hemoptysis   - agree with cefepime with pseudomonal coverage, will add doxycycline IV for MRSA coverage    - MRSA PCR        Non-massive hemoptysis   - c/w tXA nebulized 500mg  q8h x 3d  Bilateral lung nodules   -We will perform additional work-up on outpatient post discharge once in chronic stable state   Atelectasis left lower lobe    Incentive spirometry -Flutter Acapella device      Thank you for allowing me to participate in the care of this patient.   Patient/Family are satisfied with care plan and all questions have been answered.    Provider disclosure: Patient with at least one acute or chronic illness or injury that poses a threat to life or bodily function and is being  managed actively during this encounter.  All of the below services have been performed independently by signing provider:  review of prior documentation from internal and or external health records.  Review of previous and current lab results.  Interview and comprehensive assessment during patient visit today. Review of current and  previous chest radiographs/CT scans. Discussion of management and test interpretation with health care team and patient/family.   This document was prepared using Dragon voice recognition software and may include unintentional dictation errors.     Ottie Glazier, M.D.  Division of Pulmonary & Critical Care Medicine

## 2021-11-08 NOTE — ED Notes (Signed)
Patient at xray

## 2021-11-08 NOTE — Progress Notes (Signed)
Admission profile updated. ?

## 2021-11-09 DIAGNOSIS — R0902 Hypoxemia: Principal | ICD-10-CM

## 2021-11-09 DIAGNOSIS — R042 Hemoptysis: Secondary | ICD-10-CM | POA: Diagnosis not present

## 2021-11-09 LAB — BASIC METABOLIC PANEL
Anion gap: 4 — ABNORMAL LOW (ref 5–15)
BUN: 28 mg/dL — ABNORMAL HIGH (ref 8–23)
CO2: 30 mmol/L (ref 22–32)
Calcium: 8.6 mg/dL — ABNORMAL LOW (ref 8.9–10.3)
Chloride: 104 mmol/L (ref 98–111)
Creatinine, Ser: 1.23 mg/dL (ref 0.61–1.24)
GFR, Estimated: >60 mL/min (ref >60)
Glucose, Bld: 91 mg/dL (ref 70–99)
Potassium: 3.9 mmol/L (ref 3.5–5.1)
Sodium: 138 mmol/L (ref 135–145)

## 2021-11-09 LAB — CBC
HCT: 41.6 % (ref 39.0–52.0)
Hemoglobin: 14.2 g/dL (ref 13.0–17.0)
MCH: 30.1 pg (ref 26.0–34.0)
MCHC: 34.1 g/dL (ref 30.0–36.0)
MCV: 88.3 fL (ref 80.0–100.0)
Platelets: 186 10*3/uL (ref 150–400)
RBC: 4.71 MIL/uL (ref 4.22–5.81)
RDW: 14.1 % (ref 11.5–15.5)
WBC: 13.7 10*3/uL — ABNORMAL HIGH (ref 4.0–10.5)
nRBC: 0 % (ref 0.0–0.2)

## 2021-11-09 LAB — GLUCOSE, CAPILLARY
Glucose-Capillary: 84 mg/dL (ref 70–99)
Glucose-Capillary: 117 mg/dL — ABNORMAL HIGH (ref 70–99)
Glucose-Capillary: 155 mg/dL — ABNORMAL HIGH (ref 70–99)

## 2021-11-09 MED ORDER — MELATONIN 5 MG PO TABS
10.0000 mg | ORAL_TABLET | Freq: Every evening | ORAL | Status: DC | PRN
  Administered 2021-11-09 (×2): 10 mg via ORAL
  Filled 2021-11-09 (×2): qty 2

## 2021-11-09 NOTE — Progress Notes (Signed)
Mobility Specialist - Progress Note   11/09/21 1131  Mobility  Activity Ambulated independently in hallway;Stood at bedside;Dangled on edge of bed  Level of Assistance Independent  Assistive Device None  Distance Ambulated (ft) 160 ft  Activity Response Tolerated well  $Mobility charge 1 Mobility     Pre-mobility: HR, BP, SpO2 During mobility: HR, BP, SpO2 Post-mobility: HR, BP, SPO2   Pt supine upon arrival using 4L with family at beside. Completes bed mobility and STS indep w/ O2 at 93% on 4L, bumps down to 3L for ambulation. Ambulates 139ft in hallway on 3L with O2 destating to 89% but elevates to 91% with one standing rest break and PLB. Returns to room with O2 at 92% and left on 3L with RN notified. Pt is left in bed with needs in reach and family at bedside.  Clarisa Schools Mobility Specialist 11/09/21, 11:33 AM

## 2021-11-09 NOTE — Progress Notes (Signed)
PROGRESS NOTE    Bryan Mcclain  [id number] DOB: 1945-12-06 DOA: 11/08/2021 PCP: Rusty Aus, MD    Brief Narrative:  76 y.o. 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 He presents to the ER this morning for evaluation of hemoptysis that started in the early hours of the morning associated with worsening shortness of breath.  His wife states that she increased his oxygen from 3 to 4 L due to his worsening shortness of breath and when they arrived the ER on 4 L he had pulse oximetry of 88%. Patient describes a copious amount of hemoptysis and per the wife he was almost 1/2 of the emesis bag.     Assessment & Plan:   Principal Problem:   CAP (community acquired pneumonia) Active Problems:   Cough with hemoptysis   Acute respiratory failure with hypoxia ([place])   COPD with acute exacerbation ([place])   CKD stage 3 due to type 2 diabetes mellitus ([place])   Essential hypertension   Hypoxia  Community-acquired pneumonia Acute hypoxic respiratory failure Cough with hemoptysis Parainfluenza infection Patient presents for acute abscess associated with cough.  CT angio with opacity in right lung apex.  Suspect postviral pneumonia.  Hemoptysis improving.  Pulmonology involved in care Plan: Continue cefepime Continue doxycycline Follow cultures, no growth to date Nebulized tranexamic acid every 8 hours x3 days Hold aspirin Pulmonary consulted, recommendations  appreciated Wean oxygen as tolerated May require home O2 Bronchodilators  CKD stage IIIa Creatinine baseline  Essential hypertension PTA verapamil and Avapro   DVT prophylaxis: SCD Code Status: Full Family Communication: Wife at bedside 6/14 Disposition Plan: Status is: Inpatient Remains inpatient appropriate because: CAP, postviral pneumonia on IV antibiotics.  Possible discharge 6/15.   Level of care: Progressive  Consultants:  Pulmonology  Procedures:  None  Antimicrobials: Cefepime Doxycycline   Subjective: Patient seen and examined.  Resting comfortably in bed.  No visible distress.  No pain complaints.  Reports improvement in hemoptysis  Objective: Vitals:   11/09/21 0756 11/09/21 0822 11/09/21 1109 11/09/21 1140  BP:    110/68  Pulse:   70 68  Resp:   14 20  Temp:    97.7 F (36.5 C)  TempSrc:    Oral  SpO2: 96% 93% 92% 91%  Weight:      Height:        Intake/Output Summary (Last 24 hours) at 11/09/2021 1452 Last data filed at 11/09/2021 1034 Gross per 24 hour  Intake 1263.79 ml  Output --  Net 1263.79 ml   Filed Weights   11/08/21 0722  Weight: 83.9 kg    Examination:  General exam: No acute distress Respiratory system: Scattered crackles, worse on right, normal work of breathing, 2 L Cardiovascular system: S1-S2, RRR, no murmurs, no pedal edema Gastrointestinal system: Soft, NT/ND, normal bowel sounds Central nervous system: Alert and oriented. No focal neurological deficits. Extremities: Symmetric 5 x 5 power. Skin: No rashes, lesions or ulcers Psychiatry: Judgement and insight appear normal. Mood & affect appropriate.     Data Reviewed: I  have personally reviewed following labs and imaging studies  CBC: Recent Labs  Lab 11/03/21 0439 11/04/21 0317 11/05/21 0621 11/08/21 0740 11/09/21 0559  WBC 16.8* 16.6* 11.9* 11.9* 13.7*  HGB 15.0 14.2 14.6 15.7 14.2  HCT 44.8 43.4 44.7 45.9 41.6  MCV 90.3 90.8 90.9 88.6 88.3  PLT 184  187 178 195 [id number]   Basic Metabolic Panel: Recent Labs  Lab 11/03/21 0439 11/04/21 0317 11/05/21 0621 11/08/21 0740 11/09/21 0559  NA 140 140 142 138 138  K 4.4 4.1 3.8 3.5 3.9  CL 106 107 105 101 104  CO2 27 30 33* 31 30  GLUCOSE 135* 99 78 98 91  BUN 48* 40* 26* 28* 28*  CREATININE 1.69* 1.38* 1.00 1.29* 1.23  CALCIUM 8.8* 8.5* 8.9 9.0 8.6*  MG 2.3  --   --   --   --   PHOS 5.2*  --   --   --   --    GFR: Estimated Creatinine Clearance: 55.2 mL/min (by C-G formula based on SCr of 1.23 mg/dL). Liver Function Tests: No results for input(s): "AST", "ALT", "ALKPHOS", "BILITOT", "PROT", "ALBUMIN" in the last 168 hours. No results for input(s): "LIPASE", "AMYLASE" in the last 168 hours. No results for input(s): "AMMONIA" in the last 168 hours. Coagulation Profile: No results for input(s): "INR", "PROTIME" in the last 168 hours. Cardiac Enzymes: No results for input(s): "CKTOTAL", "CKMB", "CKMBINDEX", "TROPONINI" in the last 168 hours. BNP (last 3 results) No results for input(s): "PROBNP" in the last 8760 hours. HbA1C: Recent Labs    11/08/21 0740  HGBA1C 5.8*   CBG: Recent Labs  Lab 11/08/21 1220 11/08/21 1608 11/08/21 2106 11/09/21 0743 11/09/21 1212  GLUCAP 139* 130* 118* 84 117*   Lipid Profile: No results for input(s): "CHOL", "HDL", "LDLCALC", "TRIG", "CHOLHDL", "LDLDIRECT" in the last 72 hours. Thyroid Function Tests: No results for input(s): "TSH", "T4TOTAL", "FREET4", "T3FREE", "THYROIDAB" in the last 72 hours. Anemia Panel: No results for input(s): "VITAMINB12", "FOLATE", "FERRITIN", "TIBC", "IRON", "RETICCTPCT" in the last 72 hours. Sepsis Labs: No results for input(s): "PROCALCITON", "LATICACIDVEN" in the last 168 hours.  Recent Results (from the past 240 hour(s))  Resp Panel by RT-PCR (Flu A&B, Covid) Anterior Nasal Swab     Status: None   Collection Time: 11/01/21 10:59 AM   Specimen: Anterior Nasal Swab  Result Value Ref Range Status   SARS  Coronavirus 2 by RT PCR NEGATIVE NEGATIVE Final    Comment: (NOTE) SARS-CoV-2 target nucleic acids are NOT DETECTED.  The SARS-CoV-2 RNA is generally detectable in upper respiratory specimens during the acute phase of infection. The lowest concentration of SARS-CoV-2 viral copies this assay can detect is 138 copies/mL. A negative result does not preclude SARS-Cov-2 infection and should not be used as the sole basis for treatment or other patient management decisions. A negative result may occur with  improper specimen collection/handling, submission of specimen other than nasopharyngeal swab, presence of viral mutation(s) within the areas targeted by this assay, and inadequate number of viral copies(<138 copies/mL). A negative result must be combined with clinical observations, patient history, and epidemiological information. The expected result is Negative.  Fact Sheet for Patients:  EntrepreneurPulse.com.au  Fact Sheet for Healthcare Providers:  IncredibleEmployment.be  This test is no t yet approved or cleared by the [place] FDA and  has been authorized for detection and/or diagnosis of SARS-CoV-2 by FDA under an Emergency Use Authorization (EUA). This EUA will remain  in effect (meaning this test  can be used) for the duration of the COVID-19 declaration under Section 564(b)(1) of the Act, 21 U.S.C.section 360bbb-3(b)(1), unless the authorization is terminated  or revoked sooner.       Influenza A by PCR NEGATIVE NEGATIVE Final   Influenza B by PCR NEGATIVE NEGATIVE Final    Comment: (NOTE) The Xpert Xpress SARS-CoV-2/FLU/RSV plus assay is intended as an aid in the diagnosis of influenza from Nasopharyngeal swab specimens and should not be used as a sole basis for treatment. Nasal washings and aspirates are unacceptable for Xpert Xpress SARS-CoV-2/FLU/RSV testing.  Fact Sheet for  Patients: BloggerCourse.com  Fact Sheet for Healthcare Providers: SeriousBroker.it  This test is not yet approved or cleared by the [place] FDA and has been authorized for detection and/or diagnosis of SARS-CoV-2 by FDA under an Emergency Use Authorization (EUA). This EUA will remain in effect (meaning this test can be used) for the duration of the COVID-19 declaration under Section 564(b)(1) of the Act, 21 U.S.C. section 360bbb-3(b)(1), unless the authorization is terminated or revoked.  Performed at [facility], [address] [place], [place] [address]   Respiratory (~20 pathogens) panel by PCR     Status: Abnormal   Collection Time: 11/03/21  5:15 PM   Specimen: Nasopharyngeal Swab; Respiratory  Result Value Ref Range Status   Adenovirus NOT DETECTED NOT DETECTED Final   Coronavirus 229E NOT DETECTED NOT DETECTED Final    Comment: (NOTE) The Coronavirus on the Respiratory Panel, DOES NOT test for the novel  Coronavirus (2019 nCoV)    Coronavirus HKU1 NOT DETECTED NOT DETECTED Final   Coronavirus NL63 NOT DETECTED NOT DETECTED Final   Coronavirus OC43 NOT DETECTED NOT DETECTED Final   Metapneumovirus NOT DETECTED NOT DETECTED Final   Rhinovirus / Enterovirus NOT DETECTED NOT DETECTED Final   Influenza A NOT DETECTED NOT DETECTED Final   Influenza B NOT DETECTED NOT DETECTED Final   Parainfluenza Virus 1 NOT DETECTED NOT DETECTED Final   Parainfluenza Virus 2 NOT DETECTED NOT DETECTED Final   Parainfluenza Virus 3 NOT DETECTED NOT DETECTED Final   Parainfluenza Virus 4 DETECTED (A) NOT DETECTED Final   Respiratory Syncytial Virus NOT DETECTED NOT DETECTED Final   Bordetella pertussis NOT DETECTED NOT DETECTED Final   Bordetella Parapertussis NOT DETECTED NOT DETECTED Final   Chlamydophila pneumoniae NOT DETECTED NOT DETECTED Final   Mycoplasma pneumoniae NOT DETECTED NOT DETECTED Final    Comment:  Performed at [facility], [address] [place], [place] [address]  Culture, blood (Routine X 2) w Reflex to ID Panel     Status: None (Preliminary result)   Collection Time: 11/08/21  7:39 PM   Specimen: BLOOD  Result Value Ref Range Status   Specimen Description BLOOD RIGHT ASSIST CONTROL  Final   Special Requests   Final    BOTTLES DRAWN AEROBIC AND ANAEROBIC Blood Culture adequate volume   Culture   Final    NO GROWTH < 12 HOURS Performed at [facility], [address], [place], [place] [address]    Report Status PENDING  Incomplete  Culture, blood (Routine X 2) w Reflex to ID Panel     Status: None (Preliminary result)   Collection Time: 11/08/21  7:49 PM   Specimen: BLOOD  Result Value Ref Range Status   Specimen Description BLOOD LEFT ASSIST CONTROL  Final   Special Requests   Final    BOTTLES DRAWN AEROBIC AND ANAEROBIC Blood Culture adequate volume  Culture   Final    NO GROWTH < 12 HOURS Performed at [facility], [address] [place], [place] [address]    Report Status PENDING  Incomplete         Radiology Studies: CT Angio Chest PE W and/or Wo Contrast  Result Date: 11/08/2021 CLINICAL DATA:  76 year old male with COPD, shortness of breath and hemoptysis. Left lower lobe lung nodule on CT this month. EXAM: CT ANGIOGRAPHY CHEST WITH CONTRAST TECHNIQUE: Multidetector CT imaging of the chest was performed using the standard protocol during bolus administration of intravenous contrast. Multiplanar CT image reconstructions and MIPs were obtained to evaluate the vascular anatomy. RADIATION DOSE REDUCTION: This exam was performed according to the departmental dose-optimization program which includes automated exposure control, adjustment of the mA and/or kV according to patient size and/or use of iterative reconstruction technique. CONTRAST:  16mL OMNIPAQUE IOHEXOL 350 MG/ML SOLN COMPARISON:  Chest CT 11/02/2021. Two-view chest radiographs 0759  hours today. FINDINGS: Cardiovascular: Good contrast bolus timing in the pulmonary arterial tree. No focal filling defect identified in the pulmonary arteries to suggest acute pulmonary embolism. Calcified coronary artery atherosclerosis. Calcified aortic atherosclerosis. No significant pericardial effusion. Mediastinum/Nodes: No mediastinal or hilar lymphadenopathy. Lungs/Pleura: New confluent but mostly sub solid medial right lung apex peribronchial and peripheral opacity consistent with distal inflammation or infection on series 6, image 35. Underlying centrilobular emphysema. Central airways remain patent although there is progressive bilateral lower lung airway thickening and opacification (series 6 images 111 through 122). In some areas there is early tree-in-bud nodular opacity now such as right lateral basal segment series 6, image 135. But otherwise lung base ventilation is stable, with some areas of curvilinear subpleural atelectasis or scarring. Stable left lower lobe lung nodule abutting the diaphragm on series 6, image 136 corresponding to the dominant lesion previously described. Smaller left lingula and upper lobe subpleural nodules are stable such as series 6, image 98 and 126. No pleural effusion. Upper Abdomen: Lipid laden gallstones (series 7, image 565). No pericholecystic inflammation is visible. Negative visible liver, spleen, pancreas, kidneys, and bowel in the upper abdomen. Bilateral adrenal gland thickening such as due to adrenal hyperplasia. Musculoskeletal: Stable. Chronic left rib fractures. Spine degeneration. 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 Review of the MIP images confirms the above findings. IMPRESSION: 1. Negative for acute pulmonary embolus. 2. 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 New confluent but mostly sub-solid opacity in the right lung apex since last week is compatible with acute infection, inflammation, or  hemoptysis in this setting. And progressive bilateral lower lung airway thickening and opacification plus new right lower lobe tree-in-bud nodular opacity compatible with acute distal airway infection. No pleural effusion. 3. Unchanged left lung nodules, the largest in the lower lobe abutting the diaphragm up to 18 mm as reported on 11/02/2021. Consider one of the following in 3 months for both low-risk and high-risk individuals: (a) repeat chest CT, (b) follow-up PET-CT, or (c) tissue sampling. Set designer Society 2017; Radiology 2017; [phone]). 4. Cholelithiasis. Calcified coronary artery and Aortic Atherosclerosis (ICD10-I70.0). Electronically Signed   By: Genevie Ann M.D.   On: 11/08/2021 09:21   DG Chest 2 View  Result Date: 11/08/2021 CLINICAL DATA:  Shortness of breath.  Hemoptysis.  Hypoxia.  COPD. EXAM: CHEST - 2 VIEW COMPARISON:  11/01/2021 FINDINGS: The heart size and mediastinal contours are within normal limits. Aortic atherosclerotic calcification incidentally noted. Marked pulmonary hyperinflation is again seen, consistent with COPD. No evidence  of acute infiltrate or pleural effusion. IMPRESSION: COPD. No active cardiopulmonary disease. Electronically Signed   By: Marlaine Hind M.D.   On: 11/08/2021 08:10        Scheduled Meds:  alfuzosin  10 mg Oral Daily   dutasteride  0.5 mg Oral Daily   fluticasone furoate-vilanterol  1 puff Inhalation Daily   And   umeclidinium bromide  1 puff Inhalation Daily   insulin aspart  0-15 Units Subcutaneous TID WC   irbesartan  150 mg Oral Daily   latanoprost  1 drop Both Eyes QHS   [START ON 11/10/2021] predniSONE  40 mg Oral Q breakfast   simvastatin  40 mg Oral QPM   tranexamic acid  500 mg Nebulization Q8H   verapamil  240 mg Oral Daily   Continuous Infusions:  ceFEPime (MAXIPIME) IV 2 g (11/09/21 0826)   doxycycline (VIBRAMYCIN) IV 100 mg (11/09/21 1325)     LOS: 1 day    Sidney Ace, MD [facility]   If 7PM-7AM,  please contact night-coverage  11/09/2021, 2:52 PM

## 2021-11-09 NOTE — Progress Notes (Signed)
Mobility Specialist - Progress Note    11/09/21 1600  Mobility  Activity Ambulated independently in hallway;Stood at bedside;Dangled on edge of bed  Level of Assistance Independent  Assistive Device None  Distance Ambulated (ft) 160 ft  Activity Response Tolerated well  $Mobility charge 1 Mobility      Pt in bed upon arrival using 2L with family at bedside. Completes ambulation on 2L with O2 between 89-92% -- one author initiated rest break w PLB, pt shows no signs of distress and tolerates well. Pt returns to bed with needs in reach, family at bedside, on 2L at 93%.  Clarisa Schools Mobility Specialist 11/09/21, 4:14 PM

## 2021-11-10 DIAGNOSIS — J189 Pneumonia, unspecified organism: Secondary | ICD-10-CM | POA: Diagnosis not present

## 2021-11-10 LAB — BASIC METABOLIC PANEL
Anion gap: 3 — ABNORMAL LOW (ref 5–15)
BUN: 33 mg/dL — ABNORMAL HIGH (ref 8–23)
CO2: 31 mmol/L (ref 22–32)
Calcium: 8.5 mg/dL — ABNORMAL LOW (ref 8.9–10.3)
Chloride: 105 mmol/L (ref 98–111)
Creatinine, Ser: 1.32 mg/dL — ABNORMAL HIGH (ref 0.61–1.24)
GFR, Estimated: 56 mL/min — ABNORMAL LOW (ref >60)
Glucose, Bld: 88 mg/dL (ref 70–99)
Potassium: 4 mmol/L (ref 3.5–5.1)
Sodium: 139 mmol/L (ref 135–145)

## 2021-11-10 LAB — GLUCOSE, CAPILLARY: Glucose-Capillary: 89 mg/dL (ref 70–99)

## 2021-11-10 LAB — CBC
HCT: 41.9 % (ref 39.0–52.0)
Hemoglobin: 14.3 g/dL (ref 13.0–17.0)
MCH: 30.3 pg (ref 26.0–34.0)
MCHC: 34.1 g/dL (ref 30.0–36.0)
MCV: 88.8 fL (ref 80.0–100.0)
Platelets: 186 10*3/uL (ref 150–400)
RBC: 4.72 MIL/uL (ref 4.22–5.81)
RDW: 14.2 % (ref 11.5–15.5)
WBC: 12.3 10*3/uL — ABNORMAL HIGH (ref 4.0–10.5)
nRBC: 0 % (ref 0.0–0.2)

## 2021-11-10 MED ORDER — CEFADROXIL 1 G PO TABS: 1.0000 g | ORAL_TABLET | Freq: Two times a day (BID) | ORAL | 0 refills | Status: AC

## 2021-11-10 MED ORDER — DOXYCYCLINE HYCLATE 100 MG PO TABS: 100.0000 mg | ORAL_TABLET | Freq: Two times a day (BID) | ORAL | 0 refills | Status: AC

## 2021-11-10 NOTE — TOC CM/SW Note (Signed)
Pt has orders to discharge home today. Chart reviewed. PCP is Bethann Punches, MD. PT is on 3L of home oxygen through [employer]. No wounds. Pt and wife are requesting order for portable oxygen. MD entered order for POC evaluation. [employer] will call pt next week to schedule. Wife has been notified. Wife has on oxygen tank in the car for the ride home. No further concerns. CSW signing off.

## 2021-11-10 NOTE — Discharge Summary (Signed)
Physician Discharge Summary  Bryan Mcclain [id number] DOB: 02/25/1946 DOA: 11/08/2021  PCP: Rusty Aus, MD  Admit date: 11/08/2021 Discharge date: 11/10/2021  Admitted From: Home Disposition: Home  Recommendations for Outpatient Follow-up:  Follow up with PCP in 1-2 weeks Follow-up outpatient pulmonology  Home Health: No Equipment/Devices: Oxygen 2 L via nasal cannula  Discharge Condition: Stable CODE STATUS: Full Diet recommendation: Regular  Brief/Interim Summary: 76 y.o. 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 He presents to the ER this morning for evaluation of hemoptysis that started in the early hours of the morning associated with worsening shortness of breath.  His wife states that she increased his oxygen from 3 to 4 L due to his worsening shortness of breath and when they arrived the ER on 4 L he had pulse oximetry of 88%. Patient describes a copious amount of hemoptysis and per the wife he was almost 1/2 of the emesis bag.  Stable at time of discharge.  Hemoglobin stable.  No further hemoptysis.  Completed 2.5 days tranexamic acid nebulizer.  Clinically improved.  Transition to oral antibiotics and discharge home.  Discussed with pulmonology.  Patient can follow-up with [facility] clinic pulmonology or can establish with his previous referred pulmonologist at [facility]    Discharge Diagnoses:  Principal Problem:   CAP (community acquired pneumonia) Active  Problems:   Cough with hemoptysis   Acute respiratory failure with hypoxia ([place])   COPD with acute exacerbation ([place])   CKD stage 3 due to type 2 diabetes mellitus ([place])   Essential hypertension   Hypoxia  Community-acquired pneumonia Acute hypoxic respiratory failure Cough with hemoptysis Parainfluenza infection Patient presents for acute abscess associated with cough.  CT angio with opacity in right lung apex.  Suspect postviral pneumonia.  Hemoptysis improving.  Pulmonology involved in care Plan: Discontinue IV cefepime and IV doxycycline.  Transition to p.o. cefadroxil and p.o. doxycycline to complete antibiotic course.  Discharge home.  Oxygen as necessary.  Follow-up outpatient PCP and pulmonology.   CKD stage IIIa Creatinine baseline   Essential hypertension PTA verapamil and Avapro  Discharge Instructions  Discharge Instructions     Diet - low sodium heart healthy   Complete by: As directed    Increase activity slowly   Complete by: As directed       Allergies as of 11/10/2021       Reactions   Moxifloxacin Hives, Shortness Of Breath        Medication List     STOP taking these medications    azithromycin 250 MG tablet Commonly known as: Zithromax Z-Pak   Melatonin 10 MG Caps       TAKE these medications    albuterol (2.5 MG/3ML) 0.083% nebulizer solution Commonly known as: PROVENTIL Take 2.5 mg by nebulization 4 (four) times daily.   albuterol 108 (90 Base) MCG/ACT inhaler Commonly known as: VENTOLIN HFA Inhale 2 puffs into the lungs every 6 (six) hours as needed.   alfuzosin 10 MG 24 hr tablet Commonly known as: UROXATRAL Take 10 mg by mouth  daily.   aspirin 81 MG chewable tablet Chew 81 mg by mouth daily.   cefadroxil 1 g tablet Commonly known as: DURICEF Take 1 tablet (1 g total) by mouth 2 (two) times daily for 5 days.   cetirizine 10 MG tablet Commonly known as: ZYRTEC Take 10 mg by mouth daily.   doxycycline 100 MG  tablet Commonly known as: VIBRA-TABS Take 1 tablet (100 mg total) by mouth 2 (two) times daily for 5 days.   dutasteride 0.5 MG capsule Commonly known as: AVODART Take 0.5 mg by mouth daily.   ipratropium-albuterol 0.5-2.5 (3) MG/3ML Soln Commonly known as: DUONEB Take 3 mLs by nebulization 2 (two) times daily.   loratadine 10 MG tablet Commonly known as: CLARITIN Take 1 tablet (10 mg total) by mouth daily.   Lumigan 0.01 % Soln Generic drug: bimatoprost Place 1 drop into both eyes every evening.   olmesartan 20 MG tablet Commonly known as: BENICAR Take 20 mg by mouth daily.   predniSONE 5 MG tablet Commonly known as: DELTASONE Advised to take prednisone 50 mg daily and wean by 5 mg every day.   simvastatin 40 MG tablet Commonly known as: ZOCOR Take 40 mg by mouth daily.   Trelegy Ellipta 100-62.5-25 MCG/ACT Aepb Generic drug: Fluticasone-Umeclidin-Vilant Inhale 1 puff into the lungs daily.   verapamil 240 MG 24 hr capsule Commonly known as: VERELAN PM Take 240 mg by mouth daily.               Durable Medical Equipment  (From admission, onward)           Start     Ordered   11/10/21 0932  For home use only DME Other see comment  Once       Comments: Home POC eval  Question:  Length of Need  Answer:  6 Months   11/10/21 0931            Follow-up Information     Vida Rigger, MD Follow up.   Specialty: Pulmonary Disease Why: Follow up if needed with Dr. Hortencia Pilar information: [address] [place] [address] [phone]                Allergies  Allergen Reactions   Moxifloxacin Hives and Shortness Of Breath    Consultations: Pulmonology   Procedures/Studies: CT Angio Chest PE W and/or Wo Contrast  Result Date: 11/08/2021 CLINICAL DATA:  76 year old male with COPD, shortness of breath and hemoptysis. Left lower lobe lung nodule on CT this month. EXAM: CT ANGIOGRAPHY CHEST WITH CONTRAST TECHNIQUE:  Multidetector CT imaging of the chest was performed using the standard protocol during bolus administration of intravenous contrast. Multiplanar CT image reconstructions and MIPs were obtained to evaluate the vascular anatomy. RADIATION DOSE REDUCTION: This exam was performed according to the departmental dose-optimization program which includes automated exposure control, adjustment of the mA and/or kV according to patient size and/or use of iterative reconstruction technique. CONTRAST:  81mL OMNIPAQUE IOHEXOL 350 MG/ML SOLN COMPARISON:  Chest CT 11/02/2021. Two-view chest radiographs 0759 hours today. FINDINGS: Cardiovascular: Good contrast bolus timing in the pulmonary arterial tree. No focal filling defect identified in the pulmonary arteries to suggest acute pulmonary embolism. Calcified coronary artery atherosclerosis. Calcified aortic atherosclerosis. No significant pericardial effusion. Mediastinum/Nodes: No mediastinal or hilar lymphadenopathy. Lungs/Pleura: New confluent but mostly sub solid medial right lung apex peribronchial and peripheral opacity consistent with distal inflammation or infection on series 6, image 35. Underlying centrilobular emphysema. Central airways  remain patent although there is progressive bilateral lower lung airway thickening and opacification (series 6 images 111 through 122). In some areas there is early tree-in-bud nodular opacity now such as right lateral basal segment series 6, image 135. But otherwise lung base ventilation is stable, with some areas of curvilinear subpleural atelectasis or scarring. Stable left lower lobe lung nodule abutting the diaphragm on series 6, image 136 corresponding to the dominant lesion previously described. Smaller left lingula and upper lobe subpleural nodules are stable such as series 6, image 98 and 126. No pleural effusion. Upper Abdomen: Lipid laden gallstones (series 7, image 565). No pericholecystic inflammation is visible. Negative  visible liver, spleen, pancreas, kidneys, and bowel in the upper abdomen. Bilateral adrenal gland thickening such as due to adrenal hyperplasia. Musculoskeletal: Stable. Chronic left rib fractures. Spine degeneration. 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 Review of the MIP images confirms the above findings. IMPRESSION: 1. Negative for acute pulmonary embolus. 2. 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 New confluent but mostly sub-solid opacity in the right lung apex since last week is compatible with acute infection, inflammation, or hemoptysis in this setting. And progressive bilateral lower lung airway thickening and opacification plus new right lower lobe tree-in-bud nodular opacity compatible with acute distal airway infection. No pleural effusion. 3. Unchanged left lung nodules, the largest in the lower lobe abutting the diaphragm up to 18 mm as reported on 11/02/2021. Consider one of the following in 3 months for both low-risk and high-risk individuals: (a) repeat chest CT, (b) follow-up PET-CT, or (c) tissue sampling. Set designer Society 2017; Radiology 2017; [phone]). 4. Cholelithiasis. Calcified coronary artery and Aortic Atherosclerosis (ICD10-I70.0). Electronically Signed   By: Genevie Ann M.D.   On: 11/08/2021 09:21   DG Chest 2 View  Result Date: 11/08/2021 CLINICAL DATA:  Shortness of breath.  Hemoptysis.  Hypoxia.  COPD. EXAM: CHEST - 2 VIEW COMPARISON:  11/01/2021 FINDINGS: The heart size and mediastinal contours are within normal limits. Aortic atherosclerotic calcification incidentally noted. Marked pulmonary hyperinflation is again seen, consistent with COPD. No evidence of acute infiltrate or pleural effusion. IMPRESSION: COPD. No active cardiopulmonary disease. Electronically Signed   By: Marlaine Hind M.D.   On: 11/08/2021 08:10   CT CHEST W CONTRAST  Result Date: 11/02/2021 CLINICAL DATA:  Shortness of breath EXAM: CT CHEST WITH  CONTRAST TECHNIQUE: Multidetector CT imaging of the chest was performed during intravenous contrast administration. RADIATION DOSE REDUCTION: This exam was performed according to the departmental dose-optimization program which includes automated exposure control, adjustment of the mA and/or kV according to patient size and/or use of iterative reconstruction technique. CONTRAST:  69mL OMNIPAQUE IOHEXOL 300 MG/ML  SOLN COMPARISON:  None Available. FINDINGS: Cardiovascular: No significant vascular findings. Normal heart size. No pericardial effusion. Thoracic aortic atherosclerosis. Mediastinum/Nodes: No enlarged mediastinal, hilar, or axillary lymph nodes. Thyroid gland, trachea, and esophagus demonstrate no significant findings. Lungs/Pleura: 9 x 18 mm lobulated left lower lobe pulmonary nodule. Bibasilar scarring versus atelectasis. Centrilobular emphysema. 5 mm right upper lobe pulmonary nodule. Upper Abdomen: No acute abnormality. Nonobstructing 4 mm right renal calculus. Abdominal aortic atherosclerosis. Musculoskeletal: No acute osseous abnormality. No aggressive osseous lesion. 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 IMPRESSION: 1. A 9 x 18 mm lobulated left lower lobe pulmonary nodule. Findings are concerning for malignancy until proven otherwise. 5 mm right upper lobe pulmonary nodule. Consider one of the following in 3 months  for both low-risk and high-risk individuals: (a) repeat chest CT, (b) follow-up PET-CT, or (c) tissue sampling. This recommendation follows the consensus statement: Guidelines for Management of Incidental Pulmonary Nodules Detected on CT Images: From the Fleischner Society 2017; Radiology 2017; 284:228-243. 2. 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 3. Nonobstructing 4 mm right renal calculus. 4. Bibasilar scarring versus atelectasis. 5. Aortic Atherosclerosis  (ICD10-I70.0) and Emphysema (ICD10-J43.9). Electronically Signed   By: Elige Ko M.D.   On: 11/02/2021 15:54   DG Chest 2 View  Result Date: 11/01/2021 CLINICAL DATA:  Shortness of breath EXAM: CHEST - 2 VIEW COMPARISON:  None Available. FINDINGS: Background changes of COPD with likely chronic interstitial prominence. No consolidation or edema. No pleural effusion or pneumothorax. Normal heart size. IMPRESSION: COPD.  No acute process in the chest. Electronically Signed   By: Guadlupe Spanish M.D.   On: 11/01/2021 10:48      Subjective: Seen and examined on the day of discharge.  Stable no distress.  Stable for discharge home.  Discharge Exam: Vitals:   11/10/21 0415 11/10/21 0800  BP: 137/67 (!) 155/84  Pulse: (!) 55 (!) 55  Resp: 19 18  Temp: 97.6 F (36.4 C) 98 F (36.7 C)  SpO2: 95% 95%   Vitals:   11/09/21 2110 11/09/21 2250 11/10/21 0415 11/10/21 0800  BP: 133/79 132/66 137/67 (!) 155/84  Pulse: 70 (!) 55 (!) 55 (!) 55  Resp: 14 (!) 22 19 18   Temp:  (!) 97.5 F (36.4 C) 97.6 F (36.4 C) 98 F (36.7 C)  TempSrc:  Axillary Oral Oral  SpO2: 95% 94% 95% 95%  Weight:      Height:        General: Pt is alert, awake, not in acute distress Cardiovascular: RRR, S1/S2 +, no rubs, no gallops Respiratory: CTA bilaterally, no wheezing, no rhonchi Abdominal: Soft, NT, ND, bowel sounds + Extremities: no edema, no cyanosis    The results of significant diagnostics from this hospitalization (including imaging, microbiology, ancillary and laboratory) are listed below for reference.     Microbiology: Recent Results (from the past 240 hour(s))  Resp Panel by RT-PCR (Flu A&B, Covid) Anterior Nasal Swab     Status: None   Collection Time: 11/01/21 10:59 AM   Specimen: Anterior Nasal Swab  Result Value Ref Range Status   SARS Coronavirus 2 by RT PCR NEGATIVE NEGATIVE Final    Comment: (NOTE) SARS-CoV-2 target nucleic acids are NOT DETECTED.  The SARS-CoV-2 RNA is generally  detectable in upper respiratory specimens during the acute phase of infection. The lowest concentration of SARS-CoV-2 viral copies this assay can detect is 138 copies/mL. A negative result does not preclude SARS-Cov-2 infection and should not be used as the sole basis for treatment or other patient management decisions. A negative result may occur with  improper specimen collection/handling, submission of specimen other than nasopharyngeal swab, presence of viral mutation(s) within the areas targeted by this assay, and inadequate number of viral copies(<138 copies/mL). A negative result must be combined with clinical observations, patient history, and epidemiological information. The expected result is Negative.  Fact Sheet for Patients:  BloggerCourse.com  Fact Sheet for Healthcare Providers:  SeriousBroker.it  This test is no t yet approved or cleared by the [place] FDA and  has been authorized for detection and/or diagnosis of SARS-CoV-2 by FDA under an Emergency Use Authorization (EUA). This EUA will remain  in effect (  meaning this test can be used) for the duration of the COVID-19 declaration under Section 564(b)(1) of the Act, 21 U.S.C.section 360bbb-3(b)(1), unless the authorization is terminated  or revoked sooner.       Influenza A by PCR NEGATIVE NEGATIVE Final   Influenza B by PCR NEGATIVE NEGATIVE Final    Comment: (NOTE) The Xpert Xpress SARS-CoV-2/FLU/RSV plus assay is intended as an aid in the diagnosis of influenza from Nasopharyngeal swab specimens and should not be used as a sole basis for treatment. Nasal washings and aspirates are unacceptable for Xpert Xpress SARS-CoV-2/FLU/RSV testing.  Fact Sheet for Patients: EntrepreneurPulse.com.au  Fact Sheet for Healthcare Providers: IncredibleEmployment.be  This test is not yet approved or cleared by the [place] FDA  and has been authorized for detection and/or diagnosis of SARS-CoV-2 by FDA under an Emergency Use Authorization (EUA). This EUA will remain in effect (meaning this test can be used) for the duration of the COVID-19 declaration under Section 564(b)(1) of the Act, 21 U.S.C. section 360bbb-3(b)(1), unless the authorization is terminated or revoked.  Performed at [facility], [place], [place] [address]   Respiratory (~20 pathogens) panel by PCR     Status: Abnormal   Collection Time: 11/03/21  5:15 PM   Specimen: Nasopharyngeal Swab; Respiratory  Result Value Ref Range Status   Adenovirus NOT DETECTED NOT DETECTED Final   Coronavirus 229E NOT DETECTED NOT DETECTED Final    Comment: (NOTE) The Coronavirus on the Respiratory Panel, DOES NOT test for the novel  Coronavirus (2019 nCoV)    Coronavirus HKU1 NOT DETECTED NOT DETECTED Final   Coronavirus NL63 NOT DETECTED NOT DETECTED Final   Coronavirus OC43 NOT DETECTED NOT DETECTED Final   Metapneumovirus NOT DETECTED NOT DETECTED Final   Rhinovirus / Enterovirus NOT DETECTED NOT DETECTED Final   Influenza A NOT DETECTED NOT DETECTED Final   Influenza B NOT DETECTED NOT DETECTED Final   Parainfluenza Virus 1 NOT DETECTED NOT DETECTED Final   Parainfluenza Virus 2 NOT DETECTED NOT DETECTED Final   Parainfluenza Virus 3 NOT DETECTED NOT DETECTED Final   Parainfluenza Virus 4 DETECTED (A) NOT DETECTED Final   Respiratory Syncytial Virus NOT DETECTED NOT DETECTED Final   Bordetella pertussis NOT DETECTED NOT DETECTED Final   Bordetella Parapertussis NOT DETECTED NOT DETECTED Final   Chlamydophila pneumoniae NOT DETECTED NOT DETECTED Final   Mycoplasma pneumoniae NOT DETECTED NOT DETECTED Final    Comment: Performed at [facility], [place]. [address] [place], [place] [address]  Culture, blood (Routine X 2) w Reflex to ID Panel     Status: None (Preliminary result)   Collection Time: 11/08/21  7:39 PM    Specimen: BLOOD  Result Value Ref Range Status   Specimen Description BLOOD RIGHT ASSIST CONTROL  Final   Special Requests   Final    BOTTLES DRAWN AEROBIC AND ANAEROBIC Blood Culture adequate volume   Culture   Final    NO GROWTH 2 DAYS Performed at [facility], [address], [place], [place] [address]    Report Status PENDING  Incomplete  Culture, blood (Routine X 2) w Reflex to ID Panel     Status: None (Preliminary result)   Collection Time: 11/08/21  7:49 PM   Specimen: BLOOD  Result Value Ref Range Status   Specimen Description BLOOD LEFT ASSIST CONTROL  Final   Special Requests   Final    BOTTLES DRAWN AEROBIC AND ANAEROBIC Blood Culture adequate volume  Culture   Final    NO GROWTH 2 DAYS Performed at [facility], [address] [place], [place] [address]    Report Status PENDING  Incomplete     Labs: BNP (last 3 results) Recent Labs    11/01/21 1024  BNP [id number]   Basic Metabolic Panel: Recent Labs  Lab 11/04/21 0317 11/05/21 0621 11/08/21 0740 11/09/21 0559 11/10/21 0738  NA 140 142 138 138 139  K 4.1 3.8 3.5 3.9 4.0  CL 107 105 101 104 105  CO2 30 33* 31 30 31   GLUCOSE 99 78 98 91 88  BUN 40* 26* 28* 28* 33*  CREATININE 1.38* 1.00 1.29* 1.23 1.32*  CALCIUM 8.5* 8.9 9.0 8.6* 8.5*   Liver Function Tests: No results for input(s): "AST", "ALT", "ALKPHOS", "BILITOT", "PROT", "ALBUMIN" in the last 168 hours. No results for input(s): "LIPASE", "AMYLASE" in the last 168 hours. No results for input(s): "AMMONIA" in the last 168 hours. CBC: Recent Labs  Lab 11/04/21 0317 11/05/21 0621 11/08/21 0740 11/09/21 0559 11/10/21 0738  WBC 16.6* 11.9* 11.9* 13.7* 12.3*  HGB 14.2 14.6 15.7 14.2 14.3  HCT 43.4 44.7 45.9 41.6 41.9  MCV 90.8 90.9 88.6 88.3 88.8  PLT 187 178 195 186 186   Cardiac Enzymes: No results for input(s): "CKTOTAL", "CKMB", "CKMBINDEX", "TROPONINI" in the last 168 hours. BNP: Invalid input(s):  "POCBNP" CBG: Recent Labs  Lab 11/08/21 2106 11/09/21 0743 11/09/21 1212 11/09/21 1636 11/10/21 0803  GLUCAP 118* 84 117* 155* 89   D-Dimer No results for input(s): "DDIMER" in the last 72 hours. Hgb A1c Recent Labs    11/08/21 0740  HGBA1C 5.8*   Lipid Profile No results for input(s): "CHOL", "HDL", "LDLCALC", "TRIG", "CHOLHDL", "LDLDIRECT" in the last 72 hours. Thyroid function studies No results for input(s): "TSH", "T4TOTAL", "T3FREE", "THYROIDAB" in the last 72 hours.  Invalid input(s): "FREET3" Anemia work up No results for input(s): "VITAMINB12", "FOLATE", "FERRITIN", "TIBC", "IRON", "RETICCTPCT" in the last 72 hours. Urinalysis No results found for: "COLORURINE", "APPEARANCEUR", "LABSPEC", "PHURINE", "GLUCOSEU", "HGBUR", "BILIRUBINUR", "KETONESUR", "PROTEINUR", "UROBILINOGEN", "NITRITE", "LEUKOCYTESUR" Sepsis Labs Recent Labs  Lab 11/05/21 0621 11/08/21 0740 11/09/21 0559 11/10/21 0738  WBC 11.9* 11.9* 13.7* 12.3*   Microbiology Recent Results (from the past 240 hour(s))  Resp Panel by RT-PCR (Flu A&B, Covid) Anterior Nasal Swab     Status: None   Collection Time: 11/01/21 10:59 AM   Specimen: Anterior Nasal Swab  Result Value Ref Range Status   SARS Coronavirus 2 by RT PCR NEGATIVE NEGATIVE Final    Comment: (NOTE) SARS-CoV-2 target nucleic acids are NOT DETECTED.  The SARS-CoV-2 RNA is generally detectable in upper respiratory specimens during the acute phase of infection. The lowest concentration of SARS-CoV-2 viral copies this assay can detect is 138 copies/mL. A negative result does not preclude SARS-Cov-2 infection and should not be used as the sole basis for treatment or other patient management decisions. A negative result may occur with  improper specimen collection/handling, submission of specimen other than nasopharyngeal swab, presence of viral mutation(s) within the areas targeted by this assay, and inadequate number of viral copies(<138  copies/mL). A negative result must be combined with clinical observations, patient history, and epidemiological information. The expected result is Negative.  Fact Sheet for Patients:  EntrepreneurPulse.com.au  Fact Sheet for Healthcare Providers:  IncredibleEmployment.be  This test is no t yet approved or cleared by the [place] FDA and  has been authorized for detection and/or diagnosis of SARS-CoV-2 by  FDA under an Emergency Use Authorization (EUA). This EUA will remain  in effect (meaning this test can be used) for the duration of the COVID-19 declaration under Section 564(b)(1) of the Act, 21 U.S.C.section 360bbb-3(b)(1), unless the authorization is terminated  or revoked sooner.       Influenza A by PCR NEGATIVE NEGATIVE Final   Influenza B by PCR NEGATIVE NEGATIVE Final    Comment: (NOTE) The Xpert Xpress SARS-CoV-2/FLU/RSV plus assay is intended as an aid in the diagnosis of influenza from Nasopharyngeal swab specimens and should not be used as a sole basis for treatment. Nasal washings and aspirates are unacceptable for Xpert Xpress SARS-CoV-2/FLU/RSV testing.  Fact Sheet for Patients: EntrepreneurPulse.com.au  Fact Sheet for Healthcare Providers: IncredibleEmployment.be  This test is not yet approved or cleared by the [place] FDA and has been authorized for detection and/or diagnosis of SARS-CoV-2 by FDA under an Emergency Use Authorization (EUA). This EUA will remain in effect (meaning this test can be used) for the duration of the COVID-19 declaration under Section 564(b)(1) of the Act, 21 U.S.C. section 360bbb-3(b)(1), unless the authorization is terminated or revoked.  Performed at [facility], [place], [place] [address]   Respiratory (~20 pathogens) panel by PCR     Status: Abnormal   Collection Time: 11/03/21  5:15 PM   Specimen: Nasopharyngeal  Swab; Respiratory  Result Value Ref Range Status   Adenovirus NOT DETECTED NOT DETECTED Final   Coronavirus 229E NOT DETECTED NOT DETECTED Final    Comment: (NOTE) The Coronavirus on the Respiratory Panel, DOES NOT test for the novel  Coronavirus (2019 nCoV)    Coronavirus HKU1 NOT DETECTED NOT DETECTED Final   Coronavirus NL63 NOT DETECTED NOT DETECTED Final   Coronavirus OC43 NOT DETECTED NOT DETECTED Final   Metapneumovirus NOT DETECTED NOT DETECTED Final   Rhinovirus / Enterovirus NOT DETECTED NOT DETECTED Final   Influenza A NOT DETECTED NOT DETECTED Final   Influenza B NOT DETECTED NOT DETECTED Final   Parainfluenza Virus 1 NOT DETECTED NOT DETECTED Final   Parainfluenza Virus 2 NOT DETECTED NOT DETECTED Final   Parainfluenza Virus 3 NOT DETECTED NOT DETECTED Final   Parainfluenza Virus 4 DETECTED (A) NOT DETECTED Final   Respiratory Syncytial Virus NOT DETECTED NOT DETECTED Final   Bordetella pertussis NOT DETECTED NOT DETECTED Final   Bordetella Parapertussis NOT DETECTED NOT DETECTED Final   Chlamydophila pneumoniae NOT DETECTED NOT DETECTED Final   Mycoplasma pneumoniae NOT DETECTED NOT DETECTED Final    Comment: Performed at [place] [facility], [place]. [address], [place], [place] [address]  Culture, blood (Routine X 2) w Reflex to ID Panel     Status: None (Preliminary result)   Collection Time: 11/08/21  7:39 PM   Specimen: BLOOD  Result Value Ref Range Status   Specimen Description BLOOD RIGHT ASSIST CONTROL  Final   Special Requests   Final    BOTTLES DRAWN AEROBIC AND ANAEROBIC Blood Culture adequate volume   Culture   Final    NO GROWTH 2 DAYS Performed at [facility], [address], [place], [place] [address]    Report Status PENDING  Incomplete  Culture, blood (Routine X 2) w Reflex to ID Panel     Status: None (Preliminary result)   Collection Time: 11/08/21  7:49 PM   Specimen: BLOOD  Result Value Ref Range Status   Specimen Description BLOOD LEFT  ASSIST CONTROL  Final   Special Requests  Final    BOTTLES DRAWN AEROBIC AND ANAEROBIC Blood Culture adequate volume   Culture   Final    NO GROWTH 2 DAYS Performed at [facility] [address] [place], [place] [address]    Report Status PENDING  Incomplete     Time coordinating discharge: Over 30 minutes  SIGNED:   Tresa Moore, MD  [facility] 11/10/2021, 2:33 PM Pager   If 7PM-7AM, please contact night-coverage

## 2021-11-10 NOTE — Plan of Care (Signed)
  Problem: Health Behavior/Discharge Planning: Goal: Ability to manage health-related needs will improve Outcome: Progressing   Problem: Clinical Measurements: Goal: Ability to maintain clinical measurements within normal limits will improve Outcome: Progressing Goal: Respiratory complications will improve Outcome: Progressing   Problem: Nutrition: Goal: Adequate nutrition will be maintained Outcome: Progressing   

## 2021-11-13 LAB — CULTURE, BLOOD (ROUTINE X 2)
Culture: NO GROWTH
Culture: NO GROWTH
Special Requests: ADEQUATE
Special Requests: ADEQUATE

## 2021-12-27 ENCOUNTER — Other Ambulatory Visit: Payer: Self-pay | Admitting: Internal Medicine

## 2021-12-27 ENCOUNTER — Ambulatory Visit
Admission: RE | Admit: 2021-12-27 | Discharge: 2021-12-27 | Disposition: A | Discharge status: Home / Self Care | Payer: Medicare PPO | Source: Ambulatory Visit | Attending: Internal Medicine | Admitting: Internal Medicine

## 2021-12-27 DIAGNOSIS — M5116 Intervertebral disc disorders with radiculopathy, lumbar region: Secondary | ICD-10-CM

## 2021-12-27 DIAGNOSIS — R911 Solitary pulmonary nodule: Secondary | ICD-10-CM | POA: Insufficient documentation

## 2021-12-27 MED ORDER — IOHEXOL 300 MG/ML  SOLN
80.0000 mL | Freq: Once | INTRAMUSCULAR | Status: AC | PRN
  Administered 2021-12-27: 80 mL via INTRAVENOUS

## 2022-01-20 HISTORY — PX: BRONCHOSCOPY: SUR163

## 2022-04-10 ENCOUNTER — Other Ambulatory Visit
Admission: RE | Admit: 2022-04-10 | Discharge: 2022-04-10 | Disposition: A | Discharge status: Home / Self Care | Payer: Medicare PPO | Source: Ambulatory Visit | Attending: Student | Admitting: Student

## 2022-04-10 DIAGNOSIS — Z03818 Encounter for observation for suspected exposure to other biological agents ruled out: Secondary | ICD-10-CM | POA: Insufficient documentation

## 2022-04-10 DIAGNOSIS — R062 Wheezing: Secondary | ICD-10-CM | POA: Insufficient documentation

## 2022-04-10 DIAGNOSIS — M7989 Other specified soft tissue disorders: Secondary | ICD-10-CM | POA: Insufficient documentation

## 2022-04-10 LAB — BRAIN NATRIURETIC PEPTIDE: B Natriuretic Peptide: 88.5 pg/mL (ref 0.0–100.0)

## 2022-04-13 ENCOUNTER — Emergency Department: Payer: Medicare PPO

## 2022-04-13 ENCOUNTER — Other Ambulatory Visit: Payer: Self-pay

## 2022-04-13 ENCOUNTER — Emergency Department
Admission: EM | Admit: 2022-04-13 | Discharge: 2022-04-13 | Disposition: A | Discharge status: Home / Self Care | Payer: Medicare PPO | Attending: Emergency Medicine | Admitting: Emergency Medicine

## 2022-04-13 DIAGNOSIS — R0602 Shortness of breath: Secondary | ICD-10-CM | POA: Diagnosis present

## 2022-04-13 DIAGNOSIS — J441 Chronic obstructive pulmonary disease with (acute) exacerbation: Secondary | ICD-10-CM | POA: Insufficient documentation

## 2022-04-13 DIAGNOSIS — Z7951 Long term (current) use of inhaled steroids: Secondary | ICD-10-CM | POA: Diagnosis not present

## 2022-04-13 LAB — CBC
HCT: 40.2 % (ref 39.0–52.0)
Hemoglobin: 13.5 g/dL (ref 13.0–17.0)
MCH: 29.5 pg (ref 26.0–34.0)
MCHC: 33.6 g/dL (ref 30.0–36.0)
MCV: 87.8 fL (ref 80.0–100.0)
Platelets: 243 10*3/uL (ref 150–400)
RBC: 4.58 MIL/uL (ref 4.22–5.81)
RDW: 14 % (ref 11.5–15.5)
WBC: 12 10*3/uL — ABNORMAL HIGH (ref 4.0–10.5)
nRBC: 0 % (ref 0.0–0.2)

## 2022-04-13 LAB — COMPREHENSIVE METABOLIC PANEL
ALT: 18 U/L (ref 0–44)
AST: 21 U/L (ref 15–41)
Albumin: 4 g/dL (ref 3.5–5.0)
Alkaline Phosphatase: 50 U/L (ref 38–126)
Anion gap: 9 (ref 5–15)
BUN: 21 mg/dL (ref 8–23)
CO2: 28 mmol/L (ref 22–32)
Calcium: 9.1 mg/dL (ref 8.9–10.3)
Chloride: 103 mmol/L (ref 98–111)
Creatinine, Ser: 1.4 mg/dL — ABNORMAL HIGH (ref 0.61–1.24)
GFR, Estimated: 52 mL/min — ABNORMAL LOW (ref >60)
Glucose, Bld: 136 mg/dL — ABNORMAL HIGH (ref 70–99)
Potassium: 3.2 mmol/L — ABNORMAL LOW (ref 3.5–5.1)
Sodium: 140 mmol/L (ref 135–145)
Total Bilirubin: 1 mg/dL (ref 0.3–1.2)
Total Protein: 6.9 g/dL (ref 6.5–8.1)

## 2022-04-13 LAB — TROPONIN I (HIGH SENSITIVITY): Troponin I (High Sensitivity): 8 ng/L (ref <18)

## 2022-04-13 NOTE — ED Provider Notes (Signed)
[facility] Provider Note   Event Date/Time   First MD Initiated Contact with Patient 04/13/22 1854     (approximate) History  Shortness of Breath  HPI Bryan Mcclain is a 76 y.o. male with a stated past medical history of COPD who presents after 5 days of shortness of breath and currently taking antibiotics and steroids for a COPD exacerbation that was diagnosed 4 days prior to arrival who presents complaining of worsening shortness of breath this morning that has resolved since onset.  Patient denies any complaints at this time but states that he had "a down day today" which means to him that his respiratory status is worse than his baseline.  Patient states that he has these "down days" intermittently and feels that this is similar to days that he has had like this in the past.  Patient denies any productive cough, fever, or missed doses of his medications. ROS: Patient currently denies any vision changes, tinnitus, difficulty speaking, facial droop, sore throat, chest pain, abdominal pain, nausea/vomiting/diarrhea, dysuria, or weakness/numbness/paresthesias in any extremity   Physical Exam  Triage Vital Signs: ED Triage Vitals  Enc Vitals Group     BP 04/13/22 1648 (!) 148/75     Pulse Rate 04/13/22 1648 95     Resp 04/13/22 1648 18     Temp 04/13/22 1648 98.7 F (37.1 C)     Temp Source 04/13/22 1648 Oral     SpO2 04/13/22 1648 91 %     Weight 04/13/22 1651 184 lb 15.5 oz (83.9 kg)     Height 04/13/22 1651 6' (1.829 m)     Head Circumference --      Peak Flow --      Pain Score 04/13/22 1651 0     Pain Loc --      Pain Edu? --      Excl. in GC? --    Most recent vital signs: Vitals:   04/13/22 1900 04/13/22 1911  BP: (!) 152/82 (!) 155/97  Pulse: 72 76  Resp:  18  Temp:  98.1 F (36.7 C)  SpO2: 96% 98%   General: Awake, oriented x4. CV:  Good peripheral perfusion.  Resp:  Normal effort.  Mild inspiratory and x-ray wheezes over bilateral lung  fields.  Nasal cannula in place at 2 L (baseline) Abd:  No distention.  Other:  Elderly Caucasian male laying in bed in no acute distress ED Results / Procedures / Treatments  Labs (all labs ordered are listed, but only abnormal results are displayed) Labs Reviewed  CBC - Abnormal; Notable for the following components:      Result Value   WBC 12.0 (*)    All other components within normal limits  COMPREHENSIVE METABOLIC PANEL - Abnormal; Notable for the following components:   Potassium 3.2 (*)    Glucose, Bld 136 (*)    Creatinine, Ser 1.40 (*)    GFR, Estimated 52 (*)    All other components within normal limits  TROPONIN I (HIGH SENSITIVITY)   EKG ED ECG REPORT I, Merwyn Katos, the attending physician, personally viewed and interpreted this ECG. Date: 04/13/2022 EKG Time: 1650 Rate: 89 Rhythm: normal sinus rhythm QRS Axis: normal Intervals: normal ST/T Wave abnormalities: normal Narrative Interpretation: no evidence of acute ischemia RADIOLOGY ED MD interpretation: 2 view chest x-ray interpreted by me shows no evidence of acute abnormalities including no pneumonia, pneumothorax, or widened mediastinum.  Signs of COPD -Agree with radiology assessment Official  radiology report(s): DG Chest 2 View  Result Date: 04/13/2022 CLINICAL DATA:  Short of breath. EXAM: CHEST - 2 VIEW COMPARISON:  Chest 11/08/2021 FINDINGS: Pulmonary hyperinflation. Prominent lung markings diffusely compatible with COPD. Negative for acute infiltrate or effusion. Negative for heart failure IMPRESSION: COPD. No acute abnormality. Electronically Signed   By: Marlan Palau M.D.   On: 04/13/2022 17:29   PROCEDURES: Critical Care performed: No .1-3 Lead EKG Interpretation  Performed by: Merwyn Katos, MD Authorized by: Merwyn Katos, MD     Interpretation: normal     ECG rate:  75   ECG rate assessment: normal     Rhythm: sinus rhythm     Ectopy: none     Conduction: normal    MEDICATIONS  ORDERED IN ED: Medications - No data to display IMPRESSION / MDM / ASSESSMENT AND PLAN / ED COURSE  I reviewed the triage vital signs and the nursing notes.                             The patient is on the cardiac monitor to evaluate for evidence of arrhythmia and/or significant heart rate changes. Patient's presentation is most consistent with acute presentation with potential threat to life or bodily function. The patient appears to be suffering from a moderate exacerbation of COPD.  Based on the history, exam, CXR/EKG, and further workup I dont suspect any other emergent cause of this presentation, such as pneumonia, acute coronary syndrome, congestive heart failure, pulmonary embolism, or pneumothorax.  ED Interventions: None necessary as patient's respiratory status is at baseline  Reassessment: They are comfortable and want to go home.  Rx: Steroids, Antibiotics, Albuterol Disposition: Discharge home with SRP. PCP follow up recommended in next 48hours.   FINAL CLINICAL IMPRESSION(S) / ED DIAGNOSES   Final diagnoses:  COPD exacerbation (HCC)  SOB (shortness of breath)   Rx / DC Orders   ED Discharge Orders     None      Note:  This document was prepared using Dragon voice recognition software and may include unintentional dictation errors.   Merwyn Katos, MD 04/13/22 2252

## 2022-04-13 NOTE — Discharge Instructions (Addendum)
Please finish all of your prescribed medications

## 2022-04-13 NOTE — ED Triage Notes (Signed)
Pt here with SOB since Monday. Pt went to [facility] on Tuesday and was given steroids but today he has been having trouble catching his breath. Pt wears 2L BNC chronically.

## 2022-05-11 ENCOUNTER — Encounter: Payer: Medicare PPO | Attending: Internal Medicine

## 2022-05-11 ENCOUNTER — Other Ambulatory Visit: Payer: Self-pay

## 2022-05-11 DIAGNOSIS — J449 Chronic obstructive pulmonary disease, unspecified: Secondary | ICD-10-CM | POA: Insufficient documentation

## 2022-05-11 DIAGNOSIS — J9611 Chronic respiratory failure with hypoxia: Secondary | ICD-10-CM | POA: Insufficient documentation

## 2022-05-11 NOTE — Progress Notes (Signed)
Virtual Visit completed. Patient informed on EP and RD appointment and 6 Minute walk test. Patient also informed of patient health questionnaires on My Chart. Patient Verbalizes understanding. Visit diagnosis can be found in [facility] 05/08/2022.

## 2022-05-15 ENCOUNTER — Encounter: Payer: Medicare PPO | Admitting: *Deleted

## 2022-05-15 VITALS — Ht 71.4 in | Wt 176.9 lb

## 2022-05-15 DIAGNOSIS — J9611 Chronic respiratory failure with hypoxia: Secondary | ICD-10-CM | POA: Diagnosis present

## 2022-05-15 DIAGNOSIS — J449 Chronic obstructive pulmonary disease, unspecified: Secondary | ICD-10-CM

## 2022-05-15 NOTE — Patient Instructions (Signed)
Patient Instructions  Patient Details  Name: Bryan Mcclain MRN: [id number] Date of Birth: 11/02/1945 Referring Provider:  Danella Penton, MD  Below are your personal goals for exercise, nutrition, and risk factors. Our goal is to help you stay on track towards obtaining and maintaining these goals. We will be discussing your progress on these goals with you throughout the program.  Initial Exercise Prescription:  Initial Exercise Prescription - 05/15/22 1400       Date of Initial Exercise RX and Referring Provider   Date 05/15/22    Referring Provider Bethann Punches MD      Oxygen   Maintain Oxygen Saturation 88% or higher      Treadmill   MPH 2    Grade 0.5    Minutes 15    METs 2.67      Recumbant Bike   Level 2    RPM 50    Watts 22    Minutes 15    METs 2      Elliptical   Level 1    Speed 2.5    Minutes 15    METs 2      T5 Nustep   Level 2    SPM 80    Minutes 15    METs 2      Biostep-RELP   Level 2    SPM 50    Minutes 15    METs 2      Track   Laps 31    Minutes 15    METs 2.69      Prescription Details   Frequency (times per week) 3    Duration Progress to 30 minutes of continuous aerobic without signs/symptoms of physical distress      Intensity   THRR 40-80% of Max Heartrate 110-132    Ratings of Perceived Exertion 11-13    Perceived Dyspnea 0-4      Progression   Progression Continue to progress workloads to maintain intensity without signs/symptoms of physical distress.      Resistance Training   Training Prescription Yes    Weight 3 lb    Reps 10-15             Exercise Goals: Frequency: Be able to perform aerobic exercise two to three times per week in program working toward 2-5 days per week of home exercise.  Intensity: Work with a perceived exertion of 11 (fairly light) - 15 (hard) while following your exercise prescription.  We will make changes to your prescription with you as you progress through the program.    Duration: Be able to do 30 to 45 minutes of continuous aerobic exercise in addition to a 5 minute warm-up and a 5 minute cool-down routine.   Nutrition Goals: Your personal nutrition goals will be established when you do your nutrition analysis with the dietician.  The following are general nutrition guidelines to follow: Cholesterol < 200mg /day Sodium < 1500mg /day Fiber: Men over 50 yrs - 30 grams per day  Personal Goals:  Personal Goals and Risk Factors at Admission - 05/15/22 1444       Core Components/Risk Factors/Patient Goals on Admission    Weight Management Yes;Weight Maintenance    Intervention Weight Management: Develop a combined nutrition and exercise program designed to reach desired caloric intake, while maintaining appropriate intake of nutrient and fiber, sodium and fats, and appropriate energy expenditure required for the weight goal.;Weight Management: Provide education and appropriate resources to help participant work on and attain  dietary goals.;Weight Management/Obesity: Establish reasonable short term and long term weight goals.    Admit Weight 176 lb 14.4 oz (80.2 kg)    Goal Weight: Short Term 176 lb (79.8 kg)    Goal Weight: Long Term 175 lb (79.4 kg)    Expected Outcomes Short Term: Continue to assess and modify interventions until short term weight is achieved;Long Term: Adherence to nutrition and physical activity/exercise program aimed toward attainment of established weight goal;Weight Maintenance: Understanding of the daily nutrition guidelines, which includes 25-35% calories from fat, 7% or less cal from saturated fats, less than 200mg  cholesterol, less than 1.5gm of sodium, & 5 or more servings of fruits and vegetables daily    Improve shortness of breath with ADL's Yes    Intervention Provide education, individualized exercise plan and daily activity instruction to help decrease symptoms of SOB with activities of daily living.    Expected Outcomes Short  Term: Improve cardiorespiratory fitness to achieve a reduction of symptoms when performing ADLs;Long Term: Be able to perform more ADLs without symptoms or delay the onset of symptoms    Increase knowledge of respiratory medications and ability to use respiratory devices properly  Yes    Intervention Provide education and demonstration as needed of appropriate use of medications, inhalers, and oxygen therapy.    Expected Outcomes Short Term: Achieves understanding of medications use. Understands that oxygen is a medication prescribed by physician. Demonstrates appropriate use of inhaler and oxygen therapy.;Long Term: Maintain appropriate use of medications, inhalers, and oxygen therapy.    Hypertension Yes    Intervention Provide education on lifestyle modifcations including regular physical activity/exercise, weight management, moderate sodium restriction and increased consumption of fresh fruit, vegetables, and low fat dairy, alcohol moderation, and smoking cessation.;Monitor prescription use compliance.    Expected Outcomes Short Term: Continued assessment and intervention until BP is < 140/16mm HG in hypertensive participants. < 130/26mm HG in hypertensive participants with diabetes, heart failure or chronic kidney disease.;Long Term: Maintenance of blood pressure at goal levels.    Lipids Yes    Intervention Provide education and support for participant on nutrition & aerobic/resistive exercise along with prescribed medications to achieve LDL 70mg , HDL >40mg .    Expected Outcomes Short Term: Participant states understanding of desired cholesterol values and is compliant with medications prescribed. Participant is following exercise prescription and nutrition guidelines.;Long Term: Cholesterol controlled with medications as prescribed, with individualized exercise RX and with personalized nutrition plan. Value goals: LDL < 70mg , HDL > 40 mg.             Tobacco Use Initial Evaluation: Social  History   Tobacco Use  Smoking Status Former   Packs/day: 1.00   Years: 42.00   Total pack years: 42.00   Types: Cigarettes   Quit date: 11/01/2021   Years since quitting: 0.5  Smokeless Tobacco Never    Exercise Goals and Review:  Exercise Goals     Row Name 05/15/22 1443             Exercise Goals   Increase Physical Activity Yes       Intervention Provide advice, education, support and counseling about physical activity/exercise needs.;Develop an individualized exercise prescription for aerobic and resistive training based on initial evaluation findings, risk stratification, comorbidities and participant's personal goals.       Expected Outcomes Short Term: Attend rehab on a regular basis to increase amount of physical activity.;Long Term: Add in home exercise to make exercise part of routine and to  increase amount of physical activity.;Long Term: Exercising regularly at least 3-5 days a week.       Increase Strength and Stamina Yes       Intervention Provide advice, education, support and counseling about physical activity/exercise needs.;Develop an individualized exercise prescription for aerobic and resistive training based on initial evaluation findings, risk stratification, comorbidities and participant's personal goals.       Expected Outcomes Short Term: Increase workloads from initial exercise prescription for resistance, speed, and METs.;Short Term: Perform resistance training exercises routinely during rehab and add in resistance training at home;Long Term: Improve cardiorespiratory fitness, muscular endurance and strength as measured by increased METs and functional capacity ( )       Able to understand and use rate of perceived exertion (RPE) scale Yes       Intervention Provide education and explanation on how to use RPE scale       Expected Outcomes Short Term: Able to use RPE daily in rehab to express subjective intensity level;Long Term:  Able to use RPE to guide  intensity level when exercising independently       Able to understand and use Dyspnea scale Yes       Intervention Provide education and explanation on how to use Dyspnea scale       Expected Outcomes Long Term: Able to use Dyspnea scale to guide intensity level when exercising independently;Short Term: Able to use Dyspnea scale daily in rehab to express subjective sense of shortness of breath during exertion       Knowledge and understanding of Target Heart Rate Range (THRR) Yes       Intervention Provide education and explanation of THRR including how the numbers were predicted and where they are located for reference       Expected Outcomes Short Term: Able to state/look up THRR;Short Term: Able to use daily as guideline for intensity in rehab;Long Term: Able to use THRR to govern intensity when exercising independently       Able to check pulse independently Yes       Intervention Review the importance of being able to check your own pulse for safety during independent exercise;Provide education and demonstration on how to check pulse in carotid and radial arteries.       Expected Outcomes Long Term: Able to check pulse independently and accurately;Short Term: Able to explain why pulse checking is important during independent exercise       Understanding of Exercise Prescription Yes       Intervention Provide education, explanation, and written materials on patient's individual exercise prescription       Expected Outcomes Short Term: Able to explain program exercise prescription;Long Term: Able to explain home exercise prescription to exercise independently                Copy of goals given to participant.

## 2022-05-15 NOTE — Progress Notes (Signed)
Pulmonary Individual Treatment Plan  Patient Details  Name: Bryan Mcclain MRN: [id number] Date of Birth: 05-Dec-1945 Referring Provider:   April Manson Pulmonary Rehab from 05/15/2022 in [facility] Cardiac and Pulmonary Rehab  Referring Provider Emily Filbert MD       Initial Encounter Date:  Flowsheet Row Pulmonary Rehab from 05/15/2022 in [facility] Cardiac and Pulmonary Rehab  Date 05/15/22       Visit Diagnosis: Chronic obstructive pulmonary disease, unspecified COPD type ([place])  Patient's Home Medications on Admission:  Current Outpatient Medications:    albuterol (PROVENTIL) (2.5 MG/3ML) 0.083% nebulizer solution, Take 2.5 mg by nebulization 4 (four) times daily., Disp: , Rfl:    albuterol (VENTOLIN HFA) 108 (90 Base) MCG/ACT inhaler, Inhale 2 puffs into the lungs every 6 (six) hours as needed. (Patient not taking: Reported on 05/11/2022), Disp: , Rfl:    albuterol (VENTOLIN HFA) 108 (90 Base) MCG/ACT inhaler, Inhale into the lungs., Disp: , Rfl:    alfuzosin (UROXATRAL) 10 MG 24 hr tablet, Take 10 mg by mouth daily. (Patient not taking: Reported on 05/11/2022), Disp: , Rfl:    alfuzosin (UROXATRAL) 10 MG 24 hr tablet, Take 1 tablet by mouth daily., Disp: , Rfl:    aspirin 81 MG chewable tablet, Chew 81 mg by mouth daily. (Patient not taking: Reported on 05/11/2022), Disp: , Rfl:    benzonatate (TESSALON) 200 MG capsule, Take 200 mg by mouth 3 (three) times daily as needed., Disp: , Rfl:    cetirizine (ZYRTEC) 10 MG tablet, Take 10 mg by mouth daily., Disp: , Rfl:    cetirizine (ZYRTEC) 10 MG tablet, Take by mouth. (Patient not taking: Reported on 05/11/2022), Disp: , Rfl:    diltiazem (CARDIZEM CD) 180 MG 24 hr capsule, Take by mouth., Disp: , Rfl:    dutasteride (AVODART) 0.5 MG capsule, Take 0.5 mg by mouth daily., Disp: , Rfl:    fluticasone (FLONASE) 50 MCG/ACT nasal spray, Place into the nose., Disp: , Rfl:    hydrochlorothiazide (HYDRODIURIL) 12.5 MG tablet, Take 12.5 mg by mouth  daily., Disp: , Rfl:    ipratropium-albuterol (DUONEB) 0.5-2.5 (3) MG/3ML SOLN, Take 3 mLs by nebulization 2 (two) times daily. (Patient not taking: Reported on 05/11/2022), Disp: , Rfl:    ipratropium-albuterol (DUONEB) 0.5-2.5 (3) MG/3ML SOLN, Inhale into the lungs., Disp: , Rfl:    loratadine (CLARITIN) 10 MG tablet, Take 1 tablet (10 mg total) by mouth daily. (Patient not taking: Reported on 05/11/2022), Disp: 30 tablet, Rfl: 1   loratadine (CLARITIN) 10 MG tablet, Take 1 tablet by mouth daily as needed., Disp: , Rfl:    LUMIGAN 0.01 % SOLN, Place 1 drop into both eyes every evening., Disp: , Rfl:    Melatonin 10 MG CAPS, Take by mouth., Disp: , Rfl:    naproxen sodium (ALEVE) 220 MG tablet, Take by mouth., Disp: , Rfl:    nystatin (MYCOSTATIN) 100000 UNIT/ML suspension, Take 5 mLs by mouth 4 (four) times daily., Disp: , Rfl:    olmesartan (BENICAR) 20 MG tablet, Take 20 mg by mouth daily., Disp: , Rfl:    predniSONE (DELTASONE) 5 MG tablet, Advised to take prednisone 50 mg daily and wean by 5 mg every day. (Patient not taking: Reported on 05/11/2022), Disp: 55 tablet, Rfl: 0   simvastatin (ZOCOR) 40 MG tablet, Take 40 mg by mouth daily. (Patient not taking: Reported on 05/11/2022), Disp: , Rfl:    simvastatin (ZOCOR) 40 MG tablet, Take 1 tablet by mouth at bedtime., Disp: ,  Rfl:    TRELEGY ELLIPTA 100-62.5-25 MCG/ACT AEPB, Inhale 1 puff into the lungs daily. (Patient not taking: Reported on 05/11/2022), Disp: , Rfl:    TRELEGY ELLIPTA 200-62.5-25 MCG/ACT AEPB, Inhale 1 puff into the lungs daily., Disp: , Rfl:    verapamil (VERELAN PM) 240 MG 24 hr capsule, Take 240 mg by mouth daily. (Patient not taking: Reported on 05/11/2022), Disp: , Rfl:   Past Medical History: Past Medical History:  Diagnosis Date   COPD (chronic obstructive pulmonary disease) (HCC)    Hyperlipidemia    Hypertension     Tobacco Use: Social History   Tobacco Use  Smoking Status Former   Packs/day: 1.00    Years: 42.00   Total pack years: 42.00   Types: Cigarettes   Quit date: 11/01/2021   Years since quitting: 0.5  Smokeless Tobacco Never    Labs: Review Flowsheet       Latest Ref Rng & Units 11/08/2021  Labs for ITP Cardiac and Pulmonary Rehab  Hemoglobin A1c 4.8 - 5.6 % 5.8      Pulmonary Assessment Scores:  Pulmonary Assessment Scores     Row Name 05/15/22 1445         ADL UCSD   ADL Phase Entry     SOB Score total 58     Rest 1     Walk 1     Stairs 3     Bath 3     Dress 2     Shop 3       CAT Score   CAT Score 22       mMRC Score   mMRC Score 1              UCSD: Self-administered rating of dyspnea associated with activities of daily living (ADLs) 6-point scale (0 = "not at all" to 5 = "maximal or unable to do because of breathlessness")  Scoring Scores range from 0 to 120.  Minimally important difference is 5 units  CAT: CAT can identify the health impairment of COPD patients and is better correlated with disease progression.  CAT has a scoring range of zero to 40. The CAT score is classified into four groups of low (less than 10), medium (10 - 20), high (21-30) and very high (31-40) based on the impact level of disease on health status. A CAT score over 10 suggests significant symptoms.  A worsening CAT score could be explained by an exacerbation, poor medication adherence, poor inhaler technique, or progression of COPD or comorbid conditions.  CAT MCID is 2 points  mMRC: mMRC (Modified Medical Research Council) Dyspnea Scale is used to assess the degree of baseline functional disability in patients of respiratory disease due to dyspnea. No minimal important difference is established. A decrease in score of 1 point or greater is considered a positive change.   Pulmonary Function Assessment:  Pulmonary Function Assessment - 05/11/22 1011       Breath   Shortness of Breath Yes;Limiting activity             Exercise Target Goals: Exercise  Program Goal: Individual exercise prescription set using results from initial 6 min walk test and THRR while considering  patient's activity barriers and safety.   Exercise Prescription Goal: Initial exercise prescription builds to 30-45 minutes a day of aerobic activity, 2-3 days per week.  Home exercise guidelines will be given to patient during program as part of exercise prescription that the participant will acknowledge.  Education:  Aerobic Exercise: - Group verbal and visual presentation on the components of exercise prescription. Introduces F.I.T.T principle from ACSM for exercise prescriptions.  Reviews F.I.T.T. principles of aerobic exercise including progression. Written material given at graduation.   Education: Resistance Exercise: - Group verbal and visual presentation on the components of exercise prescription. Introduces F.I.T.T principle from ACSM for exercise prescriptions  Reviews F.I.T.T. principles of resistance exercise including progression. Written material given at graduation.    Education: Exercise & Equipment Safety: - Individual verbal instruction and demonstration of equipment use and safety with use of the equipment. Flowsheet Row Pulmonary Rehab from 05/15/2022 in [facility] Cardiac and Pulmonary Rehab  Date 05/11/22  Educator [facility]  Instruction Review Code 1- Verbalizes Understanding       Education: Exercise Physiology & General Exercise Guidelines: - Group verbal and written instruction with models to review the exercise physiology of the cardiovascular system and associated critical values. Provides general exercise guidelines with specific guidelines to those with heart or lung disease.    Education: Flexibility, Balance, Mind/Body Relaxation: - Group verbal and visual presentation with interactive activity on the components of exercise prescription. Introduces F.I.T.T principle from ACSM for exercise prescriptions. Reviews F.I.T.T. principles of flexibility and  balance exercise training including progression. Also discusses the mind body connection.  Reviews various relaxation techniques to help reduce and manage stress (i.e. Deep breathing, progressive muscle relaxation, and visualization). Balance handout provided to take home. Written material given at graduation.   Activity Barriers & Risk Stratification:  Activity Barriers & Cardiac Risk Stratification - 05/15/22 1435       Activity Barriers & Cardiac Risk Stratification   Activity Barriers Shortness of Breath;Deconditioning;Muscular Weakness             6 Minute Walk:  6 Minute Walk     Row Name 05/15/22 1431         6 Minute Walk   Phase Initial     Distance 1200 feet     Walk Time 6 minutes     # of Rest Breaks 0     MPH 2.27     METS 2.89     RPE 14     Perceived Dyspnea  2     VO2 Peak 10.13     Symptoms Yes (comment)     Comments SOB     Resting HR 89 bpm     Resting BP 128/64     Resting Oxygen Saturation  91 %     Exercise Oxygen Saturation  during 6 min walk 88 %     Max Ex. HR 104 bpm     Max Ex. BP 162/74     2 Minute Post BP 146/74       Interval HR   1 Minute HR 97     2 Minute HR 99     3 Minute HR 100     4 Minute HR 102     5 Minute HR 102     6 Minute HR 104     2 Minute Post HR 96     Interval Heart Rate? Yes       Interval Oxygen   Interval Oxygen? Yes     Baseline Oxygen Saturation % 91 %     1 Minute Oxygen Saturation % 88 %     1 Minute Liters of Oxygen 0 L  Room Air     2 Minute Oxygen Saturation % 90 %     2  Minute Liters of Oxygen 0 L     3 Minute Oxygen Saturation % 89 %     3 Minute Liters of Oxygen 0 L     4 Minute Oxygen Saturation % 90 %     4 Minute Liters of Oxygen 0 L     5 Minute Oxygen Saturation % 90 %     5 Minute Liters of Oxygen 0 L     6 Minute Oxygen Saturation % 90 %     6 Minute Liters of Oxygen 0 L     2 Minute Post Oxygen Saturation % 92 %     2 Minute Post Liters of Oxygen 0 L             Oxygen  Initial Assessment:  Oxygen Initial Assessment - 05/11/22 1010       Home Oxygen   Home Oxygen Device Home Concentrator;E-Tanks;Portable Concentrator    Sleep Oxygen Prescription Continuous   Patients states that he could not tolerate CPAP   Liters per minute 2    Home Exercise Oxygen Prescription None    Home Resting Oxygen Prescription None    Compliance with Home Oxygen Use Yes      Initial 6 min Walk   Oxygen Used None      Program Oxygen Prescription   Program Oxygen Prescription None      Intervention   Short Term Goals To learn and understand importance of monitoring SPO2 with pulse oximeter and demonstrate accurate use of the pulse oximeter.;To learn and demonstrate proper pursed lip breathing techniques or other breathing techniques. ;To learn and exhibit compliance with exercise, home and travel O2 prescription;To learn and understand importance of maintaining oxygen saturations>88%;To learn and demonstrate proper use of respiratory medications    Long  Term Goals Exhibits compliance with exercise, home  and travel O2 prescription;Maintenance of O2 saturations>88%;Compliance with respiratory medication;Demonstrates proper use of MDI's;Verbalizes importance of monitoring SPO2 with pulse oximeter and return demonstration;Exhibits proper breathing techniques, such as pursed lip breathing or other method taught during program session             Oxygen Re-Evaluation:   Oxygen Discharge (Final Oxygen Re-Evaluation):   Initial Exercise Prescription:  Initial Exercise Prescription - 05/15/22 1400       Date of Initial Exercise RX and Referring Provider   Date 05/15/22    Referring Provider Bethann PunchesMiller, Mark MD      Oxygen   Maintain Oxygen Saturation 88% or higher      Treadmill   MPH 2    Grade 0.5    Minutes 15    METs 2.67      Recumbant Bike   Level 2    RPM 50    Watts 22    Minutes 15    METs 2      Elliptical   Level 1    Speed 2.5    Minutes 15     METs 2      T5 Nustep   Level 2    SPM 80    Minutes 15    METs 2      Biostep-RELP   Level 2    SPM 50    Minutes 15    METs 2      Track   Laps 31    Minutes 15    METs 2.69      Prescription Details   Frequency (times per week) 3    Duration Progress  to 30 minutes of continuous aerobic without signs/symptoms of physical distress      Intensity   THRR 40-80% of Max Heartrate 110-132    Ratings of Perceived Exertion 11-13    Perceived Dyspnea 0-4      Progression   Progression Continue to progress workloads to maintain intensity without signs/symptoms of physical distress.      Resistance Training   Training Prescription Yes    Weight 3 lb    Reps 10-15             Perform Capillary Blood Glucose checks as needed.  Exercise Prescription Changes:   Exercise Prescription Changes     Row Name 05/15/22 1400             Response to Exercise   Blood Pressure (Admit) 128/64       Blood Pressure (Exercise) 162/74       Blood Pressure (Exit) 126/62       Heart Rate (Admit) 89 bpm       Heart Rate (Exercise) 104 bpm       Heart Rate (Exit) 92 bpm       Oxygen Saturation (Admit) 91 %       Oxygen Saturation (Exercise) 88 %       Oxygen Saturation (Exit) 94 %       Rating of Perceived Exertion (Exercise) 14       Perceived Dyspnea (Exercise) 2       Symptoms SOB       Comments walk test results                Exercise Comments:   Exercise Goals and Review:   Exercise Goals     Row Name 05/15/22 1443             Exercise Goals   Increase Physical Activity Yes       Intervention Provide advice, education, support and counseling about physical activity/exercise needs.;Develop an individualized exercise prescription for aerobic and resistive training based on initial evaluation findings, risk stratification, comorbidities and participant's personal goals.       Expected Outcomes Short Term: Attend rehab on a regular basis to increase amount  of physical activity.;Long Term: Add in home exercise to make exercise part of routine and to increase amount of physical activity.;Long Term: Exercising regularly at least 3-5 days a week.       Increase Strength and Stamina Yes       Intervention Provide advice, education, support and counseling about physical activity/exercise needs.;Develop an individualized exercise prescription for aerobic and resistive training based on initial evaluation findings, risk stratification, comorbidities and participant's personal goals.       Expected Outcomes Short Term: Increase workloads from initial exercise prescription for resistance, speed, and METs.;Short Term: Perform resistance training exercises routinely during rehab and add in resistance training at home;Long Term: Improve cardiorespiratory fitness, muscular endurance and strength as measured by increased METs and functional capacity ( )       Able to understand and use rate of perceived exertion (RPE) scale Yes       Intervention Provide education and explanation on how to use RPE scale       Expected Outcomes Short Term: Able to use RPE daily in rehab to express subjective intensity level;Long Term:  Able to use RPE to guide intensity level when exercising independently       Able to understand and use Dyspnea scale Yes  Intervention Provide education and explanation on how to use Dyspnea scale       Expected Outcomes Long Term: Able to use Dyspnea scale to guide intensity level when exercising independently;Short Term: Able to use Dyspnea scale daily in rehab to express subjective sense of shortness of breath during exertion       Knowledge and understanding of Target Heart Rate Range (THRR) Yes       Intervention Provide education and explanation of THRR including how the numbers were predicted and where they are located for reference       Expected Outcomes Short Term: Able to state/look up THRR;Short Term: Able to use daily as guideline for  intensity in rehab;Long Term: Able to use THRR to govern intensity when exercising independently       Able to check pulse independently Yes       Intervention Review the importance of being able to check your own pulse for safety during independent exercise;Provide education and demonstration on how to check pulse in carotid and radial arteries.       Expected Outcomes Long Term: Able to check pulse independently and accurately;Short Term: Able to explain why pulse checking is important during independent exercise       Understanding of Exercise Prescription Yes       Intervention Provide education, explanation, and written materials on patient's individual exercise prescription       Expected Outcomes Short Term: Able to explain program exercise prescription;Long Term: Able to explain home exercise prescription to exercise independently                Exercise Goals Re-Evaluation :   Discharge Exercise Prescription (Final Exercise Prescription Changes):  Exercise Prescription Changes - 05/15/22 1400       Response to Exercise   Blood Pressure (Admit) 128/64    Blood Pressure (Exercise) 162/74    Blood Pressure (Exit) 126/62    Heart Rate (Admit) 89 bpm    Heart Rate (Exercise) 104 bpm    Heart Rate (Exit) 92 bpm    Oxygen Saturation (Admit) 91 %    Oxygen Saturation (Exercise) 88 %    Oxygen Saturation (Exit) 94 %    Rating of Perceived Exertion (Exercise) 14    Perceived Dyspnea (Exercise) 2    Symptoms SOB    Comments walk test results             Nutrition:  Target Goals: Understanding of nutrition guidelines, daily intake of sodium 1500mg , cholesterol 200mg , calories 30% from fat and 7% or less from saturated fats, daily to have 5 or more servings of fruits and vegetables.  Education: All About Nutrition: -Group instruction provided by verbal, written material, interactive activities, discussions, models, and posters to present general guidelines for heart  healthy nutrition including fat, fiber, MyPlate, the role of sodium in heart healthy nutrition, utilization of the nutrition label, and utilization of this knowledge for meal planning. Follow up email sent as well. Written material given at graduation.   Biometrics:  Pre Biometrics - 05/15/22 1444       Pre Biometrics   Height 5' 11.4" (1.814 m)    Weight 176 lb 14.4 oz (80.2 kg)    Waist Circumference 36.25 inches    Hip Circumference 37.5 inches    Waist to Hip Ratio 0.97 %    BMI (Calculated) 24.38    Single Leg Stand 21.5 seconds  Nutrition Therapy Plan and Nutrition Goals:  Nutrition Therapy & Goals - 05/15/22 0920       Nutrition Therapy   RD appointment deferred Yes   Pt would not like to meet with RD at this time, will continue to follow up.     Personal Nutrition Goals   Nutrition Goal Pt would not like to meet with RD at this time, will continue to follow up.      Intervention Plan   Intervention Prescribe, educate and counsel regarding individualized specific dietary modifications aiming towards targeted core components such as weight, hypertension, lipid management, diabetes, heart failure and other comorbidities.;Nutrition handout(s) given to patient.    Expected Outcomes Short Term Goal: Understand basic principles of dietary content, such as calories, fat, sodium, cholesterol and nutrients.;Short Term Goal: A plan has been developed with personal nutrition goals set during dietitian appointment.;Long Term Goal: Adherence to prescribed nutrition plan.             Nutrition Assessments:  MEDIFICTS Score Key: ?70 Need to make dietary changes  40-70 Heart Healthy Diet ? 40 Therapeutic Level Cholesterol Diet  Flowsheet Row Pulmonary Rehab from 05/15/2022 in [facility] Cardiac and Pulmonary Rehab  Picture Your Plate Total Score on Admission 69      Picture Your Plate Scores: <75 Unhealthy dietary pattern with much room for improvement. 41-50  Dietary pattern unlikely to meet recommendations for good health and room for improvement. 51-60 More healthful dietary pattern, with some room for improvement.  >60 Healthy dietary pattern, although there may be some specific behaviors that could be improved.   Nutrition Goals Re-Evaluation:   Nutrition Goals Discharge (Final Nutrition Goals Re-Evaluation):   Psychosocial: Target Goals: Acknowledge presence or absence of significant depression and/or stress, maximize coping skills, provide positive support system. Participant is able to verbalize types and ability to use techniques and skills needed for reducing stress and depression.   Education: Stress, Anxiety, and Depression - Group verbal and visual presentation to define topics covered.  Reviews how body is impacted by stress, anxiety, and depression.  Also discusses healthy ways to reduce stress and to treat/manage anxiety and depression.  Written material given at graduation.   Education: Sleep Hygiene -Provides group verbal and written instruction about how sleep can affect your health.  Define sleep hygiene, discuss sleep cycles and impact of sleep habits. Review good sleep hygiene tips.    Initial Review & Psychosocial Screening:  Initial Psych Review & Screening - 05/11/22 1013       Initial Review   Current issues with None Identified      Family Dynamics   Good Support System? Yes    Comments Tammy Sours can look to his wife and 4 boys for support. He states no stressors at the moment and wants to get his breathing better.      Barriers   Psychosocial barriers to participate in program There are no identifiable barriers or psychosocial needs.;The patient should benefit from training in stress management and relaxation.      Screening Interventions   Interventions Encouraged to exercise;To provide support and resources with identified psychosocial needs;Provide feedback about the scores to participant    Expected Outcomes  Short Term goal: Utilizing psychosocial counselor, staff and physician to assist with identification of specific Stressors or current issues interfering with healing process. Setting desired goal for each stressor or current issue identified.;Long Term Goal: Stressors or current issues are controlled or eliminated.;Short Term goal: Identification and review with participant of  any Quality of Life or Depression concerns found by scoring the questionnaire.;Long Term goal: The participant improves quality of Life and PHQ9 Scores as seen by post scores and/or verbalization of changes             Quality of Life Scores:  Scores of 19 and below usually indicate a poorer quality of life in these areas.  A difference of  2-3 points is a clinically meaningful difference.  A difference of 2-3 points in the total score of the Quality of Life Index has been associated with significant improvement in overall quality of life, self-image, physical symptoms, and general health in studies assessing change in quality of life.  PHQ-9: Review Flowsheet       05/15/2022  Depression screen PHQ 2/9  Decreased Interest 0  Down, Depressed, Hopeless 0  PHQ - 2 Score 0  Altered sleeping 0  Tired, decreased energy 2  Change in appetite 1  Feeling bad or failure about yourself  1  Trouble concentrating 1  Moving slowly or fidgety/restless 0  Suicidal thoughts 0  PHQ-9 Score 5  Difficult doing work/chores Somewhat difficult   Interpretation of Total Score  Total Score Depression Severity:  1-4 = Minimal depression, 5-9 = Mild depression, 10-14 = Moderate depression, 15-19 = Moderately severe depression, 20-27 = Severe depression   Psychosocial Evaluation and Intervention:  Psychosocial Evaluation - 05/11/22 1014       Psychosocial Evaluation & Interventions   Interventions Relaxation education;Encouraged to exercise with the program and follow exercise prescription;Stress management education    Comments  Tammy Sours can look to his wife and 4 boys for support. He states no stressors at the moment and wants to get his breathing better.    Expected Outcomes Short: Start [facility] to help with mood. Long: Maintain a healthy mental state.    Continue Psychosocial Services  Follow up required by staff             Psychosocial Re-Evaluation:   Psychosocial Discharge (Final Psychosocial Re-Evaluation):   Education: Education Goals: Education classes will be provided on a weekly basis, covering required topics. Participant will state understanding/return demonstration of topics presented.  Learning Barriers/Preferences:  Learning Barriers/Preferences - 05/11/22 1012       Learning Barriers/Preferences   Learning Barriers None    Learning Preferences None             General Pulmonary Education Topics:  Infection Prevention: - Provides verbal and written material to individual with discussion of infection control including proper hand washing and proper equipment cleaning during exercise session. Flowsheet Row Pulmonary Rehab from 05/15/2022 in [facility] Cardiac and Pulmonary Rehab  Date 05/11/22  Educator [facility]  Instruction Review Code 1- Verbalizes Understanding       Falls Prevention: - Provides verbal and written material to individual with discussion of falls prevention and safety. Flowsheet Row Pulmonary Rehab from 05/15/2022 in [facility] Cardiac and Pulmonary Rehab  Date 05/11/22  Educator [facility]  Instruction Review Code 1- Verbalizes Understanding       Chronic Lung Disease Review: - Group verbal instruction with posters, models, PowerPoint presentations and videos,  to review new updates, new respiratory medications, new advancements in procedures and treatments. Providing information on websites and "800" numbers for continued self-education. Includes information about supplement oxygen, available portable oxygen systems, continuous and intermittent flow rates, oxygen safety,  concentrators, and Medicare reimbursement for oxygen. Explanation of Pulmonary Drugs, including class, frequency, complications, importance of spacers, rinsing mouth after steroid MDI's,  and proper cleaning methods for nebulizers. Review of basic lung anatomy and physiology related to function, structure, and complications of lung disease. Review of risk factors. Discussion about methods for diagnosing sleep apnea and types of masks and machines for OSA. Includes a review of the use of types of environmental controls: home humidity, furnaces, filters, dust mite/pet prevention, HEPA vacuums. Discussion about weather changes, air quality and the benefits of nasal washing. Instruction on Warning signs, infection symptoms, calling MD promptly, preventive modes, and value of vaccinations. Review of effective airway clearance, coughing and/or vibration techniques. Emphasizing that all should Create an Action Plan. Written material given at graduation. Flowsheet Row Pulmonary Rehab from 05/15/2022 in [facility] Cardiac and Pulmonary Rehab  Education need identified 05/15/22       AED/CPR: - Group verbal and written instruction with the use of models to demonstrate the basic use of the AED with the basic ABC's of resuscitation.    Anatomy and Cardiac Procedures: - Group verbal and visual presentation and models provide information about basic cardiac anatomy and function. Reviews the testing methods done to diagnose heart disease and the outcomes of the test results. Describes the treatment choices: Medical Management, Angioplasty, or Coronary Bypass Surgery for treating various heart conditions including Myocardial Infarction, Angina, Valve Disease, and Cardiac Arrhythmias.  Written material given at graduation.   Medication Safety: - Group verbal and visual instruction to review commonly prescribed medications for heart and lung disease. Reviews the medication, class of the drug, and side effects. Includes the  steps to properly store meds and maintain the prescription regimen.  Written material given at graduation.   Other: -Provides group and verbal instruction on various topics (see comments)   Knowledge Questionnaire Score:  Knowledge Questionnaire Score - 05/15/22 1444       Knowledge Questionnaire Score   Pre Score 15/18              Core Components/Risk Factors/Patient Goals at Admission:  Personal Goals and Risk Factors at Admission - 05/15/22 1444       Core Components/Risk Factors/Patient Goals on Admission    Weight Management Yes;Weight Maintenance    Intervention Weight Management: Develop a combined nutrition and exercise program designed to reach desired caloric intake, while maintaining appropriate intake of nutrient and fiber, sodium and fats, and appropriate energy expenditure required for the weight goal.;Weight Management: Provide education and appropriate resources to help participant work on and attain dietary goals.;Weight Management/Obesity: Establish reasonable short term and long term weight goals.    Admit Weight 176 lb 14.4 oz (80.2 kg)    Goal Weight: Short Term 176 lb (79.8 kg)    Goal Weight: Long Term 175 lb (79.4 kg)    Expected Outcomes Short Term: Continue to assess and modify interventions until short term weight is achieved;Long Term: Adherence to nutrition and physical activity/exercise program aimed toward attainment of established weight goal;Weight Maintenance: Understanding of the daily nutrition guidelines, which includes 25-35% calories from fat, 7% or less cal from saturated fats, less than  cholesterol, less than 1.5gm of sodium, & 5 or more servings of fruits and vegetables daily    Improve shortness of breath with ADL's Yes    Intervention Provide education, individualized exercise plan and daily activity instruction to help decrease symptoms of SOB with activities of daily living.    Expected Outcomes Short Term: Improve  cardiorespiratory fitness to achieve a reduction of symptoms when performing ADLs;Long Term: Be able to perform more ADLs without symptoms or  delay the onset of symptoms    Increase knowledge of respiratory medications and ability to use respiratory devices properly  Yes    Intervention Provide education and demonstration as needed of appropriate use of medications, inhalers, and oxygen therapy.    Expected Outcomes Short Term: Achieves understanding of medications use. Understands that oxygen is a medication prescribed by physician. Demonstrates appropriate use of inhaler and oxygen therapy.;Long Term: Maintain appropriate use of medications, inhalers, and oxygen therapy.    Hypertension Yes    Intervention Provide education on lifestyle modifcations including regular physical activity/exercise, weight management, moderate sodium restriction and increased consumption of fresh fruit, vegetables, and low fat dairy, alcohol moderation, and smoking cessation.;Monitor prescription use compliance.    Expected Outcomes Short Term: Continued assessment and intervention until BP is < 140/21mm HG in hypertensive participants. < 130/70mm HG in hypertensive participants with diabetes, heart failure or chronic kidney disease.;Long Term: Maintenance of blood pressure at goal levels.    Lipids Yes    Intervention Provide education and support for participant on nutrition & aerobic/resistive exercise along with prescribed medications to achieve LDL 70mg , HDL >40mg .    Expected Outcomes Short Term: Participant states understanding of desired cholesterol values and is compliant with medications prescribed. Participant is following exercise prescription and nutrition guidelines.;Long Term: Cholesterol controlled with medications as prescribed, with individualized exercise RX and with personalized nutrition plan. Value goals: LDL < , HDL > 40 mg.             Education:Diabetes - Individual verbal and written  instruction to review signs/symptoms of diabetes, desired ranges of glucose level fasting, after meals and with exercise. Acknowledge that pre and post exercise glucose checks will be done for 3 sessions at entry of program.   Know Your Numbers and Heart Failure: - Group verbal and visual instruction to discuss disease risk factors for cardiac and pulmonary disease and treatment options.  Reviews associated critical values for Overweight/Obesity, Hypertension, Cholesterol, and Diabetes.  Discusses basics of heart failure: signs/symptoms and treatments.  Introduces Heart Failure Zone chart for action plan for heart failure.  Written material given at graduation.   Core Components/Risk Factors/Patient Goals Review:    Core Components/Risk Factors/Patient Goals at Discharge (Final Review):    ITP Comments:  ITP Comments     Row Name 05/11/22 1010 05/15/22 1430         ITP Comments Virtual Visit completed. Patient informed on EP and RD appointment and 6 Minute walk test. Patient also informed of patient health questionnaires on My Chart. Patient Verbalizes understanding. Visit diagnosis can be found in [facility] 05/08/2022. Completed and gym orientation. Initial ITP created and sent for review to Dr. Jinny Sanders, Medical Director.               Comments: Initial ITP

## 2022-05-17 ENCOUNTER — Encounter: Payer: Medicare PPO | Admitting: *Deleted

## 2022-05-17 DIAGNOSIS — J9611 Chronic respiratory failure with hypoxia: Secondary | ICD-10-CM | POA: Diagnosis not present

## 2022-05-17 DIAGNOSIS — J449 Chronic obstructive pulmonary disease, unspecified: Secondary | ICD-10-CM

## 2022-05-17 NOTE — Progress Notes (Signed)
Daily Session Note  Patient Details  Name: Bryan Mcclain MRN: [id number] Date of Birth: [birth year] Referring Provider:   April Manson Pulmonary Rehab from 05/15/2022 in [place] Cardiac and Pulmonary Rehab  Referring Provider Emily Filbert MD       Encounter Date: 05/17/2022  Check In:  Session Check In - 05/17/22 0814       Check-In   Supervising physician immediately available to respond to emergencies See telemetry face sheet for immediately available ER MD    Location [facility]-Cardiac & Pulmonary Rehab    Staff Present Heath Lark, RN, BSN, CCRP;Joseph Hood, RCP,RRT,BSRT;Noah Tickle, [place], Exercise Physiologist    Virtual Visit No    Medication changes reported     No    Fall or balance concerns reported    No    Warm-up and Cool-down Performed on first and last piece of equipment    Resistance Training Performed Yes    VAD Patient? No    PAD/SET Patient? No      Pain Assessment   Currently in Pain? No/denies                Social History   Tobacco Use  Smoking Status Former   Packs/day: 1.00   Years: 42.00   Total pack years: 42.00   Types: Cigarettes   Quit date: 11/01/2021   Years since quitting: 0.5  Smokeless Tobacco Never    Goals Met:  Independence with exercise equipment Exercise tolerated well No report of concerns or symptoms today  Goals Unmet:  Not Applicable  Comments: Pt able to follow exercise prescription today without complaint.  Will continue to monitor for progression.    Dr. Emily Filbert is Medical Director for [place].  Dr. Ottie Glazier is Medical Director for [facility] Pulmonary Rehabilitation.

## 2022-05-19 ENCOUNTER — Encounter: Payer: Medicare PPO | Admitting: *Deleted

## 2022-05-19 DIAGNOSIS — J9611 Chronic respiratory failure with hypoxia: Secondary | ICD-10-CM | POA: Diagnosis not present

## 2022-05-19 DIAGNOSIS — J449 Chronic obstructive pulmonary disease, unspecified: Secondary | ICD-10-CM

## 2022-05-19 NOTE — Progress Notes (Signed)
Daily Session Note  Patient Details  Name: Bryan Mcclain MRN: [id number] Date of Birth: 11-18-1945 Referring Provider:   April Manson Pulmonary Rehab from 05/15/2022 in [facility] Cardiac and Pulmonary Rehab  Referring Provider Emily Filbert MD       Encounter Date: 05/19/2022  Check In:  Session Check In - 05/19/22 0837       Check-In   Supervising physician immediately available to respond to emergencies See telemetry face sheet for immediately available ER MD    Location [facility]-Cardiac & Pulmonary Rehab    Staff Present Heath Lark, RN, BSN, CCRP;Jessica [place], MA, RCEP, CCRP, CCET;Joseph Campus, [place]    Virtual Visit No    Medication changes reported     No    Fall or balance concerns reported    No    Warm-up and Cool-down Performed on first and last piece of equipment    Resistance Training Performed Yes    VAD Patient? No    PAD/SET Patient? No      Pain Assessment   Currently in Pain? No/denies                Social History   Tobacco Use  Smoking Status Former   Packs/day: 1.00   Years: 42.00   Total pack years: 42.00   Types: Cigarettes   Quit date: 11/01/2021   Years since quitting: 0.5  Smokeless Tobacco Never    Goals Met:  Proper associated with RPD/PD & O2 Sat Independence with exercise equipment Exercise tolerated well No report of concerns or symptoms today  Goals Unmet:  Not Applicable  Comments: Pt able to follow exercise prescription today without complaint.  Will continue to monitor for progression.    Dr. Emily Filbert is Medical Director for [place].  Dr. Ottie Glazier is Medical Director for [facility] Pulmonary Rehabilitation.

## 2022-05-24 ENCOUNTER — Encounter: Payer: Medicare PPO | Admitting: *Deleted

## 2022-05-24 ENCOUNTER — Encounter: Payer: Self-pay | Admitting: *Deleted

## 2022-05-24 DIAGNOSIS — J9611 Chronic respiratory failure with hypoxia: Secondary | ICD-10-CM | POA: Diagnosis not present

## 2022-05-24 DIAGNOSIS — J449 Chronic obstructive pulmonary disease, unspecified: Secondary | ICD-10-CM

## 2022-05-24 NOTE — Progress Notes (Signed)
Daily Session Note  Patient Details  Name: Bryan Mcclain MRN: [id number] Date of Birth: 01-15-1946 Referring Provider:   April Manson Pulmonary Rehab from 05/15/2022 in [facility] Cardiac and Pulmonary Rehab  Referring Provider Emily Filbert MD       Encounter Date: 05/24/2022  Check In:  Session Check In - 05/24/22 0906       Check-In   Supervising physician immediately available to respond to emergencies See telemetry face sheet for immediately available ER MD    Location [facility]-Cardiac & Pulmonary Rehab    Staff Present Darlyne Russian, RN, ADN;Jessica Luan Pulling, MA, RCEP, CCRP, CCET;Noah Tickle, BS, Exercise Physiologist    Virtual Visit No    Medication changes reported     No    Fall or balance concerns reported    No    Warm-up and Cool-down Performed on first and last piece of equipment    Resistance Training Performed Yes    VAD Patient? No    PAD/SET Patient? No      Pain Assessment   Currently in Pain? No/denies                Social History   Tobacco Use  Smoking Status Former   Packs/day: 1.00   Years: 42.00   Total pack years: 42.00   Types: Cigarettes   Quit date: 11/01/2021   Years since quitting: 0.5  Smokeless Tobacco Never    Goals Met:  Independence with exercise equipment Exercise tolerated well No report of concerns or symptoms today Strength training completed today  Goals Unmet:  Not Applicable  Comments: Pt able to follow exercise prescription today without complaint.  Will continue to monitor for progression.    Dr. Emily Filbert is Medical Director for [place].  Dr. Ottie Glazier is Medical Director for [facility] Pulmonary Rehabilitation.

## 2022-05-24 NOTE — Progress Notes (Signed)
Pulmonary Individual Treatment Plan  Patient Details  Name: Bryan Mcclain MRN: [id number] Date of Birth: 05-Dec-1945 Referring Provider:   April Manson Pulmonary Rehab from 05/15/2022 in [facility] Cardiac and Pulmonary Rehab  Referring Provider Emily Filbert MD       Initial Encounter Date:  Flowsheet Row Pulmonary Rehab from 05/15/2022 in [facility] Cardiac and Pulmonary Rehab  Date 05/15/22       Visit Diagnosis: Chronic obstructive pulmonary disease, unspecified COPD type ([place])  Patient's Home Medications on Admission:  Current Outpatient Medications:    albuterol (PROVENTIL) (2.5 MG/3ML) 0.083% nebulizer solution, Take 2.5 mg by nebulization 4 (four) times daily., Disp: , Rfl:    albuterol (VENTOLIN HFA) 108 (90 Base) MCG/ACT inhaler, Inhale 2 puffs into the lungs every 6 (six) hours as needed. (Patient not taking: Reported on 05/11/2022), Disp: , Rfl:    albuterol (VENTOLIN HFA) 108 (90 Base) MCG/ACT inhaler, Inhale into the lungs., Disp: , Rfl:    alfuzosin (UROXATRAL) 10 MG 24 hr tablet, Take 10 mg by mouth daily. (Patient not taking: Reported on 05/11/2022), Disp: , Rfl:    alfuzosin (UROXATRAL) 10 MG 24 hr tablet, Take 1 tablet by mouth daily., Disp: , Rfl:    aspirin 81 MG chewable tablet, Chew 81 mg by mouth daily. (Patient not taking: Reported on 05/11/2022), Disp: , Rfl:    benzonatate (TESSALON) 200 MG capsule, Take 200 mg by mouth 3 (three) times daily as needed., Disp: , Rfl:    cetirizine (ZYRTEC) 10 MG tablet, Take 10 mg by mouth daily., Disp: , Rfl:    cetirizine (ZYRTEC) 10 MG tablet, Take by mouth. (Patient not taking: Reported on 05/11/2022), Disp: , Rfl:    diltiazem (CARDIZEM CD) 180 MG 24 hr capsule, Take by mouth., Disp: , Rfl:    dutasteride (AVODART) 0.5 MG capsule, Take 0.5 mg by mouth daily., Disp: , Rfl:    fluticasone (FLONASE) 50 MCG/ACT nasal spray, Place into the nose., Disp: , Rfl:    hydrochlorothiazide (HYDRODIURIL) 12.5 MG tablet, Take 12.5 mg by mouth  daily., Disp: , Rfl:    ipratropium-albuterol (DUONEB) 0.5-2.5 (3) MG/3ML SOLN, Take 3 mLs by nebulization 2 (two) times daily. (Patient not taking: Reported on 05/11/2022), Disp: , Rfl:    ipratropium-albuterol (DUONEB) 0.5-2.5 (3) MG/3ML SOLN, Inhale into the lungs., Disp: , Rfl:    loratadine (CLARITIN) 10 MG tablet, Take 1 tablet (10 mg total) by mouth daily. (Patient not taking: Reported on 05/11/2022), Disp: 30 tablet, Rfl: 1   loratadine (CLARITIN) 10 MG tablet, Take 1 tablet by mouth daily as needed., Disp: , Rfl:    LUMIGAN 0.01 % SOLN, Place 1 drop into both eyes every evening., Disp: , Rfl:    Melatonin 10 MG CAPS, Take by mouth., Disp: , Rfl:    naproxen sodium (ALEVE) 220 MG tablet, Take by mouth., Disp: , Rfl:    nystatin (MYCOSTATIN) 100000 UNIT/ML suspension, Take 5 mLs by mouth 4 (four) times daily., Disp: , Rfl:    olmesartan (BENICAR) 20 MG tablet, Take 20 mg by mouth daily., Disp: , Rfl:    predniSONE (DELTASONE) 5 MG tablet, Advised to take prednisone 50 mg daily and wean by 5 mg every day. (Patient not taking: Reported on 05/11/2022), Disp: 55 tablet, Rfl: 0   simvastatin (ZOCOR) 40 MG tablet, Take 40 mg by mouth daily. (Patient not taking: Reported on 05/11/2022), Disp: , Rfl:    simvastatin (ZOCOR) 40 MG tablet, Take 1 tablet by mouth at bedtime., Disp: ,  Rfl:    TRELEGY ELLIPTA 100-62.5-25 MCG/ACT AEPB, Inhale 1 puff into the lungs daily. (Patient not taking: Reported on 05/11/2022), Disp: , Rfl:    TRELEGY ELLIPTA 200-62.5-25 MCG/ACT AEPB, Inhale 1 puff into the lungs daily., Disp: , Rfl:    verapamil (VERELAN PM) 240 MG 24 hr capsule, Take 240 mg by mouth daily. (Patient not taking: Reported on 05/11/2022), Disp: , Rfl:   Past Medical History: Past Medical History:  Diagnosis Date   COPD (chronic obstructive pulmonary disease) (HCC)    Hyperlipidemia    Hypertension     Tobacco Use: Social History   Tobacco Use  Smoking Status Former   Packs/day: 1.00    Years: 42.00   Total pack years: 42.00   Types: Cigarettes   Quit date: 11/01/2021   Years since quitting: 0.5  Smokeless Tobacco Never    Labs: Review Flowsheet       Latest Ref Rng & Units 11/08/2021  Labs for ITP Cardiac and Pulmonary Rehab  Hemoglobin A1c 4.8 - 5.6 % 5.8      Pulmonary Assessment Scores:  Pulmonary Assessment Scores     Row Name 05/15/22 1445         ADL UCSD   ADL Phase Entry     SOB Score total 58     Rest 1     Walk 1     Stairs 3     Bath 3     Dress 2     Shop 3       CAT Score   CAT Score 22       mMRC Score   mMRC Score 1              UCSD: Self-administered rating of dyspnea associated with activities of daily living (ADLs) 6-point scale (0 = "not at all" to 5 = "maximal or unable to do because of breathlessness")  Scoring Scores range from 0 to 120.  Minimally important difference is 5 units  CAT: CAT can identify the health impairment of COPD patients and is better correlated with disease progression.  CAT has a scoring range of zero to 40. The CAT score is classified into four groups of low (less than 10), medium (10 - 20), high (21-30) and very high (31-40) based on the impact level of disease on health status. A CAT score over 10 suggests significant symptoms.  A worsening CAT score could be explained by an exacerbation, poor medication adherence, poor inhaler technique, or progression of COPD or comorbid conditions.  CAT MCID is 2 points  mMRC: mMRC (Modified Medical Research Council) Dyspnea Scale is used to assess the degree of baseline functional disability in patients of respiratory disease due to dyspnea. No minimal important difference is established. A decrease in score of 1 point or greater is considered a positive change.   Pulmonary Function Assessment:  Pulmonary Function Assessment - 05/11/22 1011       Breath   Shortness of Breath Yes;Limiting activity             Exercise Target Goals: Exercise  Program Goal: Individual exercise prescription set using results from initial 6 min walk test and THRR while considering  patient's activity barriers and safety.   Exercise Prescription Goal: Initial exercise prescription builds to 30-45 minutes a day of aerobic activity, 2-3 days per week.  Home exercise guidelines will be given to patient during program as part of exercise prescription that the participant will acknowledge.  Education:  Aerobic Exercise: - Group verbal and visual presentation on the components of exercise prescription. Introduces F.I.T.T principle from ACSM for exercise prescriptions.  Reviews F.I.T.T. principles of aerobic exercise including progression. Written material given at graduation.   Education: Resistance Exercise: - Group verbal and visual presentation on the components of exercise prescription. Introduces F.I.T.T principle from ACSM for exercise prescriptions  Reviews F.I.T.T. principles of resistance exercise including progression. Written material given at graduation.    Education: Exercise & Equipment Safety: - Individual verbal instruction and demonstration of equipment use and safety with use of the equipment. Flowsheet Row Pulmonary Rehab from 05/24/2022 in [facility] Cardiac and Pulmonary Rehab  Date 05/11/22  Educator [facility]  Instruction Review Code 1- Verbalizes Understanding       Education: Exercise Physiology & General Exercise Guidelines: - Group verbal and written instruction with models to review the exercise physiology of the cardiovascular system and associated critical values. Provides general exercise guidelines with specific guidelines to those with heart or lung disease.    Education: Flexibility, Balance, Mind/Body Relaxation: - Group verbal and visual presentation with interactive activity on the components of exercise prescription. Introduces F.I.T.T principle from ACSM for exercise prescriptions. Reviews F.I.T.T. principles of flexibility and  balance exercise training including progression. Also discusses the mind body connection.  Reviews various relaxation techniques to help reduce and manage stress (i.e. Deep breathing, progressive muscle relaxation, and visualization). Balance handout provided to take home. Written material given at graduation.   Activity Barriers & Risk Stratification:  Activity Barriers & Cardiac Risk Stratification - 05/15/22 1435       Activity Barriers & Cardiac Risk Stratification   Activity Barriers Shortness of Breath;Deconditioning;Muscular Weakness             6 Minute Walk:  6 Minute Walk     Row Name 05/15/22 1431         6 Minute Walk   Phase Initial     Distance 1200 feet     Walk Time 6 minutes     # of Rest Breaks 0     MPH 2.27     METS 2.89     RPE 14     Perceived Dyspnea  2     VO2 Peak 10.13     Symptoms Yes (comment)     Comments SOB     Resting HR 89 bpm     Resting BP 128/64     Resting Oxygen Saturation  91 %     Exercise Oxygen Saturation  during 6 min walk 88 %     Max Ex. HR 104 bpm     Max Ex. BP 162/74     2 Minute Post BP 146/74       Interval HR   1 Minute HR 97     2 Minute HR 99     3 Minute HR 100     4 Minute HR 102     5 Minute HR 102     6 Minute HR 104     2 Minute Post HR 96     Interval Heart Rate? Yes       Interval Oxygen   Interval Oxygen? Yes     Baseline Oxygen Saturation % 91 %     1 Minute Oxygen Saturation % 88 %     1 Minute Liters of Oxygen 0 L  Room Air     2 Minute Oxygen Saturation % 90 %     2  Minute Liters of Oxygen 0 L     3 Minute Oxygen Saturation % 89 %     3 Minute Liters of Oxygen 0 L     4 Minute Oxygen Saturation % 90 %     4 Minute Liters of Oxygen 0 L     5 Minute Oxygen Saturation % 90 %     5 Minute Liters of Oxygen 0 L     6 Minute Oxygen Saturation % 90 %     6 Minute Liters of Oxygen 0 L     2 Minute Post Oxygen Saturation % 92 %     2 Minute Post Liters of Oxygen 0 L             Oxygen  Initial Assessment:  Oxygen Initial Assessment - 05/11/22 1010       Home Oxygen   Home Oxygen Device Home Concentrator;E-Tanks;Portable Concentrator    Sleep Oxygen Prescription Continuous   Patients states that he could not tolerate CPAP   Liters per minute 2    Home Exercise Oxygen Prescription None    Home Resting Oxygen Prescription None    Compliance with Home Oxygen Use Yes      Initial 6 min Walk   Oxygen Used None      Program Oxygen Prescription   Program Oxygen Prescription None      Intervention   Short Term Goals To learn and understand importance of monitoring SPO2 with pulse oximeter and demonstrate accurate use of the pulse oximeter.;To learn and demonstrate proper pursed lip breathing techniques or other breathing techniques. ;To learn and exhibit compliance with exercise, home and travel O2 prescription;To learn and understand importance of maintaining oxygen saturations>88%;To learn and demonstrate proper use of respiratory medications    Long  Term Goals Exhibits compliance with exercise, home  and travel O2 prescription;Maintenance of O2 saturations>88%;Compliance with respiratory medication;Demonstrates proper use of MDI's;Verbalizes importance of monitoring SPO2 with pulse oximeter and return demonstration;Exhibits proper breathing techniques, such as pursed lip breathing or other method taught during program session             Oxygen Re-Evaluation:   Oxygen Discharge (Final Oxygen Re-Evaluation):   Initial Exercise Prescription:  Initial Exercise Prescription - 05/15/22 1400       Date of Initial Exercise RX and Referring Provider   Date 05/15/22    Referring Provider Bethann PunchesMiller, Mark MD      Oxygen   Maintain Oxygen Saturation 88% or higher      Treadmill   MPH 2    Grade 0.5    Minutes 15    METs 2.67      Recumbant Bike   Level 2    RPM 50    Watts 22    Minutes 15    METs 2      Elliptical   Level 1    Speed 2.5    Minutes 15     METs 2      T5 Nustep   Level 2    SPM 80    Minutes 15    METs 2      Biostep-RELP   Level 2    SPM 50    Minutes 15    METs 2      Track   Laps 31    Minutes 15    METs 2.69      Prescription Details   Frequency (times per week) 3    Duration Progress  to 30 minutes of continuous aerobic without signs/symptoms of physical distress      Intensity   THRR 40-80% of Max Heartrate 110-132    Ratings of Perceived Exertion 11-13    Perceived Dyspnea 0-4      Progression   Progression Continue to progress workloads to maintain intensity without signs/symptoms of physical distress.      Resistance Training   Training Prescription Yes    Weight 3 lb    Reps 10-15             Perform Capillary Blood Glucose checks as needed.  Exercise Prescription Changes:   Exercise Prescription Changes     Row Name 05/15/22 1400             Response to Exercise   Blood Pressure (Admit) 128/64       Blood Pressure (Exercise) 162/74       Blood Pressure (Exit) 126/62       Heart Rate (Admit) 89 bpm       Heart Rate (Exercise) 104 bpm       Heart Rate (Exit) 92 bpm       Oxygen Saturation (Admit) 91 %       Oxygen Saturation (Exercise) 88 %       Oxygen Saturation (Exit) 94 %       Rating of Perceived Exertion (Exercise) 14       Perceived Dyspnea (Exercise) 2       Symptoms SOB       Comments walk test results                Exercise Comments:   Exercise Goals and Review:   Exercise Goals     Row Name 05/15/22 1443             Exercise Goals   Increase Physical Activity Yes       Intervention Provide advice, education, support and counseling about physical activity/exercise needs.;Develop an individualized exercise prescription for aerobic and resistive training based on initial evaluation findings, risk stratification, comorbidities and participant's personal goals.       Expected Outcomes Short Term: Attend rehab on a regular basis to increase amount  of physical activity.;Long Term: Add in home exercise to make exercise part of routine and to increase amount of physical activity.;Long Term: Exercising regularly at least 3-5 days a week.       Increase Strength and Stamina Yes       Intervention Provide advice, education, support and counseling about physical activity/exercise needs.;Develop an individualized exercise prescription for aerobic and resistive training based on initial evaluation findings, risk stratification, comorbidities and participant's personal goals.       Expected Outcomes Short Term: Increase workloads from initial exercise prescription for resistance, speed, and METs.;Short Term: Perform resistance training exercises routinely during rehab and add in resistance training at home;Long Term: Improve cardiorespiratory fitness, muscular endurance and strength as measured by increased METs and functional capacity ( )       Able to understand and use rate of perceived exertion (RPE) scale Yes       Intervention Provide education and explanation on how to use RPE scale       Expected Outcomes Short Term: Able to use RPE daily in rehab to express subjective intensity level;Long Term:  Able to use RPE to guide intensity level when exercising independently       Able to understand and use Dyspnea scale Yes  Intervention Provide education and explanation on how to use Dyspnea scale       Expected Outcomes Long Term: Able to use Dyspnea scale to guide intensity level when exercising independently;Short Term: Able to use Dyspnea scale daily in rehab to express subjective sense of shortness of breath during exertion       Knowledge and understanding of Target Heart Rate Range (THRR) Yes       Intervention Provide education and explanation of THRR including how the numbers were predicted and where they are located for reference       Expected Outcomes Short Term: Able to state/look up THRR;Short Term: Able to use daily as guideline for  intensity in rehab;Long Term: Able to use THRR to govern intensity when exercising independently       Able to check pulse independently Yes       Intervention Review the importance of being able to check your own pulse for safety during independent exercise;Provide education and demonstration on how to check pulse in carotid and radial arteries.       Expected Outcomes Long Term: Able to check pulse independently and accurately;Short Term: Able to explain why pulse checking is important during independent exercise       Understanding of Exercise Prescription Yes       Intervention Provide education, explanation, and written materials on patient's individual exercise prescription       Expected Outcomes Short Term: Able to explain program exercise prescription;Long Term: Able to explain home exercise prescription to exercise independently                Exercise Goals Re-Evaluation :   Discharge Exercise Prescription (Final Exercise Prescription Changes):  Exercise Prescription Changes - 05/15/22 1400       Response to Exercise   Blood Pressure (Admit) 128/64    Blood Pressure (Exercise) 162/74    Blood Pressure (Exit) 126/62    Heart Rate (Admit) 89 bpm    Heart Rate (Exercise) 104 bpm    Heart Rate (Exit) 92 bpm    Oxygen Saturation (Admit) 91 %    Oxygen Saturation (Exercise) 88 %    Oxygen Saturation (Exit) 94 %    Rating of Perceived Exertion (Exercise) 14    Perceived Dyspnea (Exercise) 2    Symptoms SOB    Comments walk test results             Nutrition:  Target Goals: Understanding of nutrition guidelines, daily intake of sodium 1500mg , cholesterol 200mg , calories 30% from fat and 7% or less from saturated fats, daily to have 5 or more servings of fruits and vegetables.  Education: All About Nutrition: -Group instruction provided by verbal, written material, interactive activities, discussions, models, and posters to present general guidelines for heart  healthy nutrition including fat, fiber, MyPlate, the role of sodium in heart healthy nutrition, utilization of the nutrition label, and utilization of this knowledge for meal planning. Follow up email sent as well. Written material given at graduation.   Biometrics:  Pre Biometrics - 05/15/22 1444       Pre Biometrics   Height 5' 11.4" (1.814 m)    Weight 176 lb 14.4 oz (80.2 kg)    Waist Circumference 36.25 inches    Hip Circumference 37.5 inches    Waist to Hip Ratio 0.97 %    BMI (Calculated) 24.38    Single Leg Stand 21.5 seconds  Nutrition Therapy Plan and Nutrition Goals:  Nutrition Therapy & Goals - 05/15/22 0920       Nutrition Therapy   RD appointment deferred Yes   Pt would not like to meet with RD at this time, will continue to follow up.     Personal Nutrition Goals   Nutrition Goal Pt would not like to meet with RD at this time, will continue to follow up.      Intervention Plan   Intervention Prescribe, educate and counsel regarding individualized specific dietary modifications aiming towards targeted core components such as weight, hypertension, lipid management, diabetes, heart failure and other comorbidities.;Nutrition handout(s) given to patient.    Expected Outcomes Short Term Goal: Understand basic principles of dietary content, such as calories, fat, sodium, cholesterol and nutrients.;Short Term Goal: A plan has been developed with personal nutrition goals set during dietitian appointment.;Long Term Goal: Adherence to prescribed nutrition plan.             Nutrition Assessments:  MEDIFICTS Score Key: ?70 Need to make dietary changes  40-70 Heart Healthy Diet ? 40 Therapeutic Level Cholesterol Diet  Flowsheet Row Pulmonary Rehab from 05/15/2022 in [facility] Cardiac and Pulmonary Rehab  Picture Your Plate Total Score on Admission 69      Picture Your Plate Scores: <75 Unhealthy dietary pattern with much room for improvement. 41-50  Dietary pattern unlikely to meet recommendations for good health and room for improvement. 51-60 More healthful dietary pattern, with some room for improvement.  >60 Healthy dietary pattern, although there may be some specific behaviors that could be improved.   Nutrition Goals Re-Evaluation:   Nutrition Goals Discharge (Final Nutrition Goals Re-Evaluation):   Psychosocial: Target Goals: Acknowledge presence or absence of significant depression and/or stress, maximize coping skills, provide positive support system. Participant is able to verbalize types and ability to use techniques and skills needed for reducing stress and depression.   Education: Stress, Anxiety, and Depression - Group verbal and visual presentation to define topics covered.  Reviews how body is impacted by stress, anxiety, and depression.  Also discusses healthy ways to reduce stress and to treat/manage anxiety and depression.  Written material given at graduation.   Education: Sleep Hygiene -Provides group verbal and written instruction about how sleep can affect your health.  Define sleep hygiene, discuss sleep cycles and impact of sleep habits. Review good sleep hygiene tips.    Initial Review & Psychosocial Screening:  Initial Psych Review & Screening - 05/11/22 1013       Initial Review   Current issues with None Identified      Family Dynamics   Good Support System? Yes    Comments Tammy Sours can look to his wife and 4 boys for support. He states no stressors at the moment and wants to get his breathing better.      Barriers   Psychosocial barriers to participate in program There are no identifiable barriers or psychosocial needs.;The patient should benefit from training in stress management and relaxation.      Screening Interventions   Interventions Encouraged to exercise;To provide support and resources with identified psychosocial needs;Provide feedback about the scores to participant    Expected Outcomes  Short Term goal: Utilizing psychosocial counselor, staff and physician to assist with identification of specific Stressors or current issues interfering with healing process. Setting desired goal for each stressor or current issue identified.;Long Term Goal: Stressors or current issues are controlled or eliminated.;Short Term goal: Identification and review with participant of  any Quality of Life or Depression concerns found by scoring the questionnaire.;Long Term goal: The participant improves quality of Life and PHQ9 Scores as seen by post scores and/or verbalization of changes             Quality of Life Scores:  Scores of 19 and below usually indicate a poorer quality of life in these areas.  A difference of  2-3 points is a clinically meaningful difference.  A difference of 2-3 points in the total score of the Quality of Life Index has been associated with significant improvement in overall quality of life, self-image, physical symptoms, and general health in studies assessing change in quality of life.  PHQ-9: Review Flowsheet       05/15/2022  Depression screen PHQ 2/9  Decreased Interest 0  Down, Depressed, Hopeless 0  PHQ - 2 Score 0  Altered sleeping 0  Tired, decreased energy 2  Change in appetite 1  Feeling bad or failure about yourself  1  Trouble concentrating 1  Moving slowly or fidgety/restless 0  Suicidal thoughts 0  PHQ-9 Score 5  Difficult doing work/chores Somewhat difficult   Interpretation of Total Score  Total Score Depression Severity:  1-4 = Minimal depression, 5-9 = Mild depression, 10-14 = Moderate depression, 15-19 = Moderately severe depression, 20-27 = Severe depression   Psychosocial Evaluation and Intervention:  Psychosocial Evaluation - 05/11/22 1014       Psychosocial Evaluation & Interventions   Interventions Relaxation education;Encouraged to exercise with the program and follow exercise prescription;Stress management education    Comments  Tammy SoursGreg can look to his wife and 4 boys for support. He states no stressors at the moment and wants to get his breathing better.    Expected Outcomes Short: Start [facility] to help with mood. Long: Maintain a healthy mental state.    Continue Psychosocial Services  Follow up required by staff             Psychosocial Re-Evaluation:   Psychosocial Discharge (Final Psychosocial Re-Evaluation):   Education: Education Goals: Education classes will be provided on a weekly basis, covering required topics. Participant will state understanding/return demonstration of topics presented.  Learning Barriers/Preferences:  Learning Barriers/Preferences - 05/11/22 1012       Learning Barriers/Preferences   Learning Barriers None    Learning Preferences None             General Pulmonary Education Topics:  Infection Prevention: - Provides verbal and written material to individual with discussion of infection control including proper hand washing and proper equipment cleaning during exercise session. Flowsheet Row Pulmonary [facility]  Date 05/11/22  [facility]ator [facility]  Instruction Review Code 1- Verbalizes Understanding       Falls Prevention: - Provides verbal and written material to individual with discussion of falls prevention and safety. Flowsheet Row [facility]monary Rehab [facility]  Date 05/11/22  Educator [facility]  Instruction Review Code 1- Verbalizes Understanding       Chronic Lung Disease Review: - Group verbal instruction with posters, models, PowerPoint presentations and videos,  to review new updates, new respiratory medications, new advancements in procedures and treatments. Providing information on websites and "800" numbers for continued self-education. Includes information about supplement oxygen, available portable oxygen systems, continuous and intermittent flow rates, oxygen safety,  concentrators, and Medicare reimbursement for oxygen. Explanation of Pulmonary Drugs, including class, frequency, complications, importance of spacers, rinsing mouth after steroid MDI's,  and proper cleaning methods for nebulizers. Review of basic lung anatomy and physiology related to function, structure, and complications of lung disease. Review of risk factors. Discussion about methods for diagnosing sleep apnea and types of masks and machines for OSA. Includes a review of the use of types of environmental controls: home humidity, furnaces, filters, dust mite/pet prevention, HEPA vacuums. Discussion about weather changes, air quality and the benefits of nasal washing. Instruction on Warning signs, infection symptoms, calling MD promptly, preventive modes, and value of vaccinations. Review of effective airway clearance, coughing and/or vibration techniques. Emphasizing that all should Create an Action Plan. Written material given at graduation. Flowsheet Row Pulmonary Rehab from 05/24/2022 in [facility] Cardiac and Pulmonary Rehab  Education need identified 05/15/22  Date 05/24/22  Educator [facility]  Instruction Review Code 1- Verbalizes Understanding       AED/CPR: - Group verbal and written instruction with the use of models to demonstrate the basic use of the AED with the basic ABC's of resuscitation.    Anatomy and Cardiac Procedures: - Group verbal and visual presentation and models provide information about basic cardiac anatomy and function. Reviews the testing methods done to diagnose heart disease and the outcomes of the test results. Describes the treatment choices: Medical Management, Angioplasty, or Coronary Bypass Surgery for treating various heart conditions including Myocardial Infarction, Angina, Valve Disease, and Cardiac Arrhythmias.  Written material given at graduation.   Medication Safety: - Group verbal and visual instruction to review commonly prescribed medications for heart and lung  disease. Reviews the medication, class of the drug, and side effects. Includes the steps to properly store meds and maintain the prescription regimen.  Written material given at graduation.   Other: -Provides group and verbal instruction on various topics (see comments)   Knowledge Questionnaire Score:  Knowledge Questionnaire Score - 05/15/22 1444       Knowledge Questionnaire Score   Pre Score 15/18              Core Components/Risk Factors/Patient Goals at Admission:  Personal Goals and Risk Factors at Admission - 05/15/22 1444       Core Components/Risk Factors/Patient Goals on Admission    Weight Management Yes;Weight Maintenance    Intervention Weight Management: Develop a combined nutrition and exercise program designed to reach desired caloric intake, while maintaining appropriate intake of nutrient and fiber, sodium and fats, and appropriate energy expenditure required for the weight goal.;Weight Management: Provide education and appropriate resources to help participant work on and attain dietary goals.;Weight Management/Obesity: Establish reasonable short term and long term weight goals.    Admit Weight 176 lb 14.4 oz (80.2 kg)    Goal Weight: Short Term 176 lb (79.8 kg)    Goal Weight: Long Term 175 lb (79.4 kg)    Expected Outcomes Short Term: Continue to assess and modify interventions until short term weight is achieved;Long Term: Adherence to nutrition and physical activity/exercise program aimed toward attainment of established weight goal;Weight Maintenance: Understanding of the daily nutrition guidelines, which includes 25-35% calories from fat, 7% or less cal from saturated fats, less than  cholesterol, less than 1.5gm of sodium, & 5 or more servings of fruits and vegetables daily    Improve shortness of breath with ADL's Yes    Intervention Provide education, individualized exercise plan and daily activity instruction to help decrease symptoms of SOB with  activities of daily living.    Expected Outcomes Short Term: Improve cardiorespiratory fitness to achieve a reduction of symptoms  when performing ADLs;Long Term: Be able to perform more ADLs without symptoms or delay the onset of symptoms    Increase knowledge of respiratory medications and ability to use respiratory devices properly  Yes    Intervention Provide education and demonstration as needed of appropriate use of medications, inhalers, and oxygen therapy.    Expected Outcomes Short Term: Achieves understanding of medications use. Understands that oxygen is a medication prescribed by physician. Demonstrates appropriate use of inhaler and oxygen therapy.;Long Term: Maintain appropriate use of medications, inhalers, and oxygen therapy.    Hypertension Yes    Intervention Provide education on lifestyle modifcations including regular physical activity/exercise, weight management, moderate sodium restriction and increased consumption of fresh fruit, vegetables, and low fat dairy, alcohol moderation, and smoking cessation.;Monitor prescription use compliance.    Expected Outcomes Short Term: Continued assessment and intervention until BP is < 140/49mm HG in hypertensive participants. < 130/29mm HG in hypertensive participants with diabetes, heart failure or chronic kidney disease.;Long Term: Maintenance of blood pressure at goal levels.    Lipids Yes    Intervention Provide education and support for participant on nutrition & aerobic/resistive exercise along with prescribed medications to achieve LDL 70mg , HDL >40mg .    Expected Outcomes Short Term: Participant states understanding of desired cholesterol values and is compliant with medications prescribed. Participant is following exercise prescription and nutrition guidelines.;Long Term: Cholesterol controlled with medications as prescribed, with individualized exercise RX and with personalized nutrition plan. Value goals: LDL < 70mg , HDL > 40 mg.              Education:Diabetes - Individual verbal and written instruction to review signs/symptoms of diabetes, desired ranges of glucose level fasting, after meals and with exercise. Acknowledge that pre and post exercise glucose checks will be done for 3 sessions at entry of program.   Know Your Numbers and Heart Failure: - Group verbal and visual instruction to discuss disease risk factors for cardiac and pulmonary disease and treatment options.  Reviews associated critical values for Overweight/Obesity, Hypertension, Cholesterol, and Diabetes.  Discusses basics of heart failure: signs/symptoms and treatments.  Introduces Heart Failure Zone chart for action plan for heart failure.  Written material given at graduation. Flowsheet Row Pulmonary Rehab from 05/24/2022 in [facility] Cardiac and Pulmonary Rehab  Date 05/17/22  Educator [facility]  Instruction Review Code 1- Verbalizes Understanding       Core Components/Risk Factors/Patient Goals Review:    Core Components/Risk Factors/Patient Goals at Discharge (Final Review):    ITP Comments:  ITP Comments     Row Name 05/11/22 1010 05/15/22 1430 05/24/22 1123       ITP Comments Virtual Visit completed. Patient informed on EP and RD appointment and 6 Minute walk test. Patient also informed of patient health questionnaires on My Chart. Patient Verbalizes understanding. Visit diagnosis can be found in [facility] 05/08/2022. Completed 14/03/2022 and gym orientation. Initial ITP created and sent for review to Dr. , Medical Director. 30 Day review completed. Medical Director ITP review done, changes made as directed, and signed approval by Medical Director.    new to program              Comments:

## 2022-05-26 ENCOUNTER — Encounter: Payer: Medicare PPO | Admitting: *Deleted

## 2022-05-26 DIAGNOSIS — J9611 Chronic respiratory failure with hypoxia: Secondary | ICD-10-CM | POA: Diagnosis not present

## 2022-05-26 DIAGNOSIS — J449 Chronic obstructive pulmonary disease, unspecified: Secondary | ICD-10-CM

## 2022-05-26 NOTE — Progress Notes (Signed)
Daily Session Note  Patient Details  Name: Ellwyn Ergle MRN: [id number] Date of Birth: Aug 15, 1945 Referring Provider:   April Manson Pulmonary Rehab from 05/15/2022 in [facility] Cardiac and Pulmonary Rehab  Referring Provider Emily Filbert MD       Encounter Date: 05/26/2022  Check In:  Session Check In - 05/26/22 0831       Check-In   Supervising physician immediately available to respond to emergencies See telemetry face sheet for immediately available ER MD    Location [facility]-Cardiac & Pulmonary Rehab    Staff Present Heath Lark, RN, BSN, CCRP;Jessica Hodgkins, MA, RCEP, CCRP, CCET;Joseph [place], [place]    Virtual Visit No    Medication changes reported     No    Fall or balance concerns reported    No    Warm-up and Cool-down Performed on first and last piece of equipment    Resistance Training Performed Yes    VAD Patient? No    PAD/SET Patient? No      Pain Assessment   Currently in Pain? No/denies                Social History   Tobacco Use  Smoking Status Former   Packs/day: 1.00   Years: 42.00   Total pack years: 42.00   Types: Cigarettes   Quit date: 11/01/2021   Years since quitting: 0.5  Smokeless Tobacco Never    Goals Met:  Proper associated with RPD/PD & O2 Sat Independence with exercise equipment Exercise tolerated well No report of concerns or symptoms today  Goals Unmet:  Not Applicable  Comments: Pt able to follow exercise prescription today without complaint.  Will continue to monitor for progression.    Dr. Emily Filbert is Medical Director for [place].  Dr. Ottie Glazier is Medical Director for [facility] Pulmonary Rehabilitation.

## 2022-05-31 ENCOUNTER — Encounter: Payer: Medicare PPO | Attending: Internal Medicine | Admitting: *Deleted

## 2022-05-31 DIAGNOSIS — Z5189 Encounter for other specified aftercare: Secondary | ICD-10-CM | POA: Diagnosis not present

## 2022-05-31 DIAGNOSIS — J449 Chronic obstructive pulmonary disease, unspecified: Secondary | ICD-10-CM | POA: Diagnosis not present

## 2022-05-31 NOTE — Progress Notes (Signed)
Daily Session Note  Patient Details  Name: Bryan Mcclain MRN: [id number] Date of Birth: Feb 04, 1946 Referring Provider:   April Manson Pulmonary Rehab from 05/15/2022 in [facility] Cardiac and Pulmonary Rehab  Referring Provider Emily Filbert MD       Encounter Date: 05/31/2022  Check In:  Session Check In - 05/31/22 0812       Check-In   Supervising physician immediately available to respond to emergencies See telemetry face sheet for immediately available ER MD    Location [facility]-Cardiac & Pulmonary Rehab    Staff Present Darlyne Russian, RN, Dimple Nanas, BS, Exercise Physiologist;Joseph Tessie Fass, [place]    Virtual Visit No    Medication changes reported     No    Fall or balance concerns reported    No    Warm-up and Cool-down Performed on first and last piece of equipment    Resistance Training Performed Yes    VAD Patient? No    PAD/SET Patient? No      Pain Assessment   Currently in Pain? No/denies                Social History   Tobacco Use  Smoking Status Former   Packs/day: 1.00   Years: 42.00   Total pack years: 42.00   Types: Cigarettes   Quit date: 11/01/2021   Years since quitting: 0.5  Smokeless Tobacco Never    Goals Met:  Independence with exercise equipment Exercise tolerated well No report of concerns or symptoms today Strength training completed today  Goals Unmet:  Not Applicable  Comments: Pt able to follow exercise prescription today without complaint.  Will continue to monitor for progression.    Dr. Emily Filbert is Medical Director for [place].  Dr. Ottie Glazier is Medical Director for [facility] Pulmonary Rehabilitation.

## 2022-06-02 ENCOUNTER — Encounter: Payer: Medicare PPO | Admitting: *Deleted

## 2022-06-02 DIAGNOSIS — J449 Chronic obstructive pulmonary disease, unspecified: Secondary | ICD-10-CM | POA: Diagnosis not present

## 2022-06-02 NOTE — Progress Notes (Signed)
Daily Session Note  Patient Details  Name: Keysean Savino MRN: [id number] Date of Birth: 07/06/1945 Referring Provider:   April Manson Pulmonary Rehab from 05/15/2022 in [facility] Cardiac and Pulmonary Rehab  Referring Provider Emily Filbert MD       Encounter Date: 06/02/2022  Check In:  Session Check In - 06/02/22 0842       Check-In   Supervising physician immediately available to respond to emergencies See telemetry face sheet for immediately available ER MD    Location [facility]-Cardiac & Pulmonary Rehab    Staff Present Heath Lark, RN, BSN, CCRP;Jessica [place], MA, RCEP, CCRP, CCET;Joseph [place], [place]    Virtual Visit No    Medication changes reported     No    Fall or balance concerns reported    No    Warm-up and Cool-down Performed on first and last piece of equipment    Resistance Training Performed Yes    VAD Patient? No    PAD/SET Patient? No      Pain Assessment   Currently in Pain? No/denies                Social History   Tobacco Use  Smoking Status Former   Packs/day: 1.00   Years: 42.00   Total pack years: 42.00   Types: Cigarettes   Quit date: 11/01/2021   Years since quitting: 0.5  Smokeless Tobacco Never    Goals Met:  Proper associated with RPD/PD & O2 Sat Independence with exercise equipment Exercise tolerated well No report of concerns or symptoms today  Goals Unmet:  Not Applicable  Comments: Pt able to follow exercise prescription today without complaint.  Will continue to monitor for progression.    Dr. Emily Filbert is Medical Director for [place].  Dr. Ottie Glazier is Medical Director for [facility] Pulmonary Rehabilitation.

## 2022-06-05 ENCOUNTER — Encounter: Payer: Medicare PPO | Admitting: *Deleted

## 2022-06-05 DIAGNOSIS — J449 Chronic obstructive pulmonary disease, unspecified: Secondary | ICD-10-CM | POA: Diagnosis not present

## 2022-06-05 NOTE — Progress Notes (Signed)
Daily Session Note  Patient Details  Name: Bryan Mcclain MRN: [id number] Date of Birth: May 23, 1946 Referring Provider:   April Manson Pulmonary Rehab from 05/15/2022 in [facility] Cardiac and Pulmonary Rehab  Referring Provider Emily Filbert MD       Encounter Date: 06/05/2022  Check In:  Session Check In - 06/05/22 0815       Check-In   Supervising physician immediately available to respond to emergencies See telemetry face sheet for immediately available ER MD    Location [facility]-Cardiac & Pulmonary Rehab    Staff Present Darlyne Russian, RN, Doyce Para, BS, ACSM CEP, Exercise Physiologist;Joseph Tessie Fass, [place]    Virtual Visit No    Medication changes reported     No    Fall or balance concerns reported    No    Warm-up and Cool-down Performed on first and last piece of equipment    Resistance Training Performed Yes    VAD Patient? No    PAD/SET Patient? No      Pain Assessment   Currently in Pain? No/denies                Social History   Tobacco Use  Smoking Status Former   Packs/day: 1.00   Years: 42.00   Total pack years: 42.00   Types: Cigarettes   Quit date: 11/01/2021   Years since quitting: 0.5  Smokeless Tobacco Never    Goals Met:  Independence with exercise equipment Exercise tolerated well No report of concerns or symptoms today Strength training completed today  Goals Unmet:  Not Applicable  Comments: Pt able to follow exercise prescription today without complaint.  Will continue to monitor for progression.    Dr. Emily Filbert is Medical Director for [place].  Dr. Ottie Glazier is Medical Director for [facility] Pulmonary Rehabilitation.

## 2022-06-07 ENCOUNTER — Encounter: Payer: Medicare PPO | Admitting: *Deleted

## 2022-06-07 DIAGNOSIS — J449 Chronic obstructive pulmonary disease, unspecified: Secondary | ICD-10-CM

## 2022-06-07 NOTE — Progress Notes (Signed)
Daily Session Note  Patient Details  Name: Bryan Mcclain MRN: [id number] Date of Birth: 19-Dec-1945 Referring Provider:   April Manson Pulmonary Rehab from 05/15/2022 in [facility] Cardiac and Pulmonary Rehab  Referring Provider Emily Filbert MD       Encounter Date: 06/07/2022  Check In:  Session Check In - 06/07/22 0831       Check-In   Supervising physician immediately available to respond to emergencies See telemetry face sheet for immediately available ER MD    Location [facility]-Cardiac & Pulmonary Rehab    Staff Present Darlyne Russian, RN, ADN;Joseph Tessie Fass, RCP,RRT,BSRT;Noah Tickle, BS, Exercise Physiologist    Virtual Visit No    Medication changes reported     No    Fall or balance concerns reported    No    Warm-up and Cool-down Performed on first and last piece of equipment    Resistance Training Performed Yes    VAD Patient? No    PAD/SET Patient? No      Pain Assessment   Currently in Pain? No/denies                Social History   Tobacco Use  Smoking Status Former   Packs/day: 1.00   Years: 42.00   Total pack years: 42.00   Types: Cigarettes   Quit date: 11/01/2021   Years since quitting: 0.5  Smokeless Tobacco Never    Goals Met:  Independence with exercise equipment Exercise tolerated well No report of concerns or symptoms today Strength training completed today  Goals Unmet:  Not Applicable  Comments: Pt able to follow exercise prescription today without complaint.  Will continue to monitor for progression.    Dr. Emily Filbert is Medical Director for [place].  Dr. Ottie Glazier is Medical Director for [facility] Pulmonary Rehabilitation.

## 2022-06-09 ENCOUNTER — Encounter: Payer: Medicare PPO | Admitting: *Deleted

## 2022-06-09 DIAGNOSIS — J449 Chronic obstructive pulmonary disease, unspecified: Secondary | ICD-10-CM | POA: Diagnosis not present

## 2022-06-09 NOTE — Progress Notes (Signed)
Daily Session Note  Patient Details  Name: Avenir Lozinski MRN: [id number] Date of Birth: 10-06-1945 Referring Provider:   April Manson Pulmonary Rehab from 05/15/2022 in [facility] Cardiac and Pulmonary Rehab  Referring Provider Emily Filbert MD       Encounter Date: 06/09/2022  Check In:  Session Check In - 06/09/22 0838       Check-In   Supervising physician immediately available to respond to emergencies See telemetry face sheet for immediately available ER MD    Location [facility]-Cardiac & Pulmonary Rehab    Staff Present Heath Lark, RN, BSN, CCRP;Jessica Hanahan, MA, RCEP, CCRP, CCET;Joseph [place], [place]    Virtual Visit No    Medication changes reported     No    Fall or balance concerns reported    No    Warm-up and Cool-down Performed on first and last piece of equipment    Resistance Training Performed Yes    VAD Patient? No    PAD/SET Patient? No      Pain Assessment   Currently in Pain? No/denies                Social History   Tobacco Use  Smoking Status Former   Packs/day: 1.00   Years: 42.00   Total pack years: 42.00   Types: Cigarettes   Quit date: 11/01/2021   Years since quitting: 0.6  Smokeless Tobacco Never    Goals Met:  Proper associated with RPD/PD & O2 Sat Independence with exercise equipment Exercise tolerated well No report of concerns or symptoms today  Goals Unmet:  Not Applicable  Comments: Pt able to follow exercise prescription today without complaint.  Will continue to monitor for progression.    Dr. Emily Filbert is Medical Director for [place].  Dr. Ottie Glazier is Medical Director for [facility] Pulmonary Rehabilitation.

## 2022-06-14 ENCOUNTER — Encounter: Payer: Medicare PPO | Admitting: *Deleted

## 2022-06-14 DIAGNOSIS — J449 Chronic obstructive pulmonary disease, unspecified: Secondary | ICD-10-CM

## 2022-06-14 NOTE — Progress Notes (Signed)
Daily Session Note  Patient Details  Name: Bryan Mcclain MRN: [id number] Date of Birth: 12/22/1945 Referring Provider:   April Manson Pulmonary Rehab from 05/15/2022 in [facility] Cardiac and Pulmonary Rehab  Referring Provider Emily Filbert MD       Encounter Date: 06/14/2022  Check In:  Session Check In - 06/14/22 0759       Check-In   Supervising physician immediately available to respond to emergencies See telemetry face sheet for immediately available ER MD    Location [facility]-Cardiac & Pulmonary Rehab    Staff Present Darlyne Russian, RN, ADN;Joseph Tessie Fass, RCP,RRT,BSRT;Noah Tickle, BS, Exercise Physiologist    Virtual Visit No    Medication changes reported     No    Fall or balance concerns reported    No    Warm-up and Cool-down Performed on first and last piece of equipment    Resistance Training Performed Yes    VAD Patient? No    PAD/SET Patient? No      Pain Assessment   Currently in Pain? No/denies                Social History   Tobacco Use  Smoking Status Former   Packs/day: 1.00   Years: 42.00   Total pack years: 42.00   Types: Cigarettes   Quit date: 11/01/2021   Years since quitting: 0.6  Smokeless Tobacco Never    Goals Met:  Independence with exercise equipment Exercise tolerated well No report of concerns or symptoms today Strength training completed today  Goals Unmet:  Not Applicable  Comments: Pt able to follow exercise prescription today without complaint.  Will continue to monitor for progression.    Dr. Emily Filbert is Medical Director for [place].  Dr. Ottie Glazier is Medical Director for [facility] Pulmonary Rehabilitation.

## 2022-06-16 ENCOUNTER — Encounter: Payer: Medicare PPO | Admitting: *Deleted

## 2022-06-16 DIAGNOSIS — J449 Chronic obstructive pulmonary disease, unspecified: Secondary | ICD-10-CM

## 2022-06-16 NOTE — Progress Notes (Signed)
Daily Session Note  Patient Details  Name: Bryan Mcclain MRN: [id number] Date of Birth: Nov 18, 1945 Referring Provider:   April Manson Pulmonary Rehab from 05/15/2022 in [facility] Cardiac and Pulmonary Rehab  Referring Provider Emily Filbert MD       Encounter Date: 06/16/2022  Check In:  Session Check In - 06/16/22 0841       Check-In   Supervising physician immediately available to respond to emergencies See telemetry face sheet for immediately available ER MD    Location [facility]-Cardiac & Pulmonary Rehab    Staff Present Heath Lark, RN, BSN, CCRP;Marvell Fuller, PhD, RN, CNS, CEN;Joseph Tessie Fass, [place]    Virtual Visit No    Medication changes reported     No    Fall or balance concerns reported    No    Warm-up and Cool-down Performed on first and last piece of equipment    Resistance Training Performed Yes    VAD Patient? No    PAD/SET Patient? No      Pain Assessment   Currently in Pain? No/denies                Social History   Tobacco Use  Smoking Status Former   Packs/day: 1.00   Years: 42.00   Total pack years: 42.00   Types: Cigarettes   Quit date: 11/01/2021   Years since quitting: 0.6  Smokeless Tobacco Never    Goals Met:  Proper associated with RPD/PD & O2 Sat Independence with exercise equipment Exercise tolerated well No report of concerns or symptoms today  Goals Unmet:  Not Applicable  Comments: Pt able to follow exercise prescription today without complaint.  Will continue to monitor for progression.    Dr. Emily Filbert is Medical Director for [name].  Dr. Ottie Glazier is Medical Director for [facility] Pulmonary Rehabilitation.

## 2022-06-19 ENCOUNTER — Encounter: Payer: Medicare PPO | Admitting: *Deleted

## 2022-06-19 DIAGNOSIS — J449 Chronic obstructive pulmonary disease, unspecified: Secondary | ICD-10-CM

## 2022-06-19 NOTE — Progress Notes (Signed)
Daily Session Note  Patient Details  Name: Bryan Mcclain MRN: [id number] Date of Birth: January 27, 1946 Referring Provider:   April Manson Pulmonary Rehab from 05/15/2022 in [facility] Cardiac and Pulmonary Rehab  Referring Provider Emily Filbert MD       Encounter Date: 06/19/2022  Check In:  Session Check In - 06/19/22 0848       Check-In   Supervising physician immediately available to respond to emergencies See telemetry face sheet for immediately available ER MD    Location [facility]-Cardiac & Pulmonary Rehab    Staff Present Darlyne Russian, RN, Doyce Para, BS, ACSM CEP, Exercise Physiologist;Joseph Tessie Fass, [place]    Virtual Visit No    Medication changes reported     No    Fall or balance concerns reported    No    Warm-up and Cool-down Performed on first and last piece of equipment    Resistance Training Performed Yes    VAD Patient? No    PAD/SET Patient? No      Pain Assessment   Currently in Pain? No/denies                Social History   Tobacco Use  Smoking Status Former   Packs/day: 1.00   Years: 42.00   Total pack years: 42.00   Types: Cigarettes   Quit date: 11/01/2021   Years since quitting: 0.6  Smokeless Tobacco Never    Goals Met:  Independence with exercise equipment Exercise tolerated well No report of concerns or symptoms today Strength training completed today  Goals Unmet:  Not Applicable  Comments: Pt able to follow exercise prescription today without complaint.  Will continue to monitor for progression.    Dr. Emily Filbert is Medical Director for [place].  Dr. Ottie Glazier is Medical Director for [facility] Pulmonary Rehabilitation.

## 2022-06-21 ENCOUNTER — Encounter: Payer: Self-pay | Admitting: *Deleted

## 2022-06-21 ENCOUNTER — Encounter: Payer: Medicare PPO | Admitting: *Deleted

## 2022-06-21 DIAGNOSIS — J449 Chronic obstructive pulmonary disease, unspecified: Secondary | ICD-10-CM

## 2022-06-21 NOTE — Progress Notes (Signed)
Daily Session Note  Patient Details  Name: Bryan Mcclain MRN: [id number] Date of Birth: [birth year] Referring Provider:   [facility] from 05/15/2022 in [facility]  Referring Provider Emily Filbert MD       Encounter Date: 06/21/2022  Check In:  Session Check In - 06/21/22 0825       Check-In   Supervising physician immediately available to respond to emergencies See telemetry face sheet for immediately available ER MD    Location [facility]-Cardiac & Pul[facility]    Staff Present Darlyne Russian, RN, ADN;Joseph Tessie Fass, RCP,RRT,BSRT;Noah Tickle, BS, Exercise Physiologist    Virtual Visit No    Medication changes reported     No    Fall or balance concerns reported    No    Warm-up and Cool-down Performed on first and last piece of equipment    Resistance Training Performed Yes    VAD Patient? No    PAD/SET Patient? No      Pain Assessment   Currently in Pain? No/denies                Social History   Tobacco Use  Smoking Status Former   Packs/day: 1.00   Years: 42.00   Total pack years: 42.00   Types: Cigarettes   Quit date: 11/01/2021   Years since quitting: 0.6  Smokeless Tobacco Never    Goals Met:  Independence with exercise equipment Exercise tolerated well No report of concerns or symptoms today Strength training completed today  Goals Unmet:  Not Applicable  Comments: Pt able to follow exercise prescription today without complaint.  Will continue to monitor for progression.    Dr. Emily Filbert is Medical Director for [place].  Dr. Ottie Glazier is Medical Director for [facility].

## 2022-06-21 NOTE — Progress Notes (Signed)
Pulmonary Individual Treatment Plan  Patient Details  Name: Bryan Mcclain MRN: [id number] Date of Birth: 05-Dec-1945 Referring Provider:   April Manson Pulmonary Rehab from 05/15/2022 in [facility] Cardiac and Pulmonary Rehab  Referring Provider Emily Filbert MD       Initial Encounter Date:  Flowsheet Row Pulmonary Rehab from 05/15/2022 in [facility] Cardiac and Pulmonary Rehab  Date 05/15/22       Visit Diagnosis: Chronic obstructive pulmonary disease, unspecified COPD type ([place])  Patient's Home Medications on Admission:  Current Outpatient Medications:    albuterol (PROVENTIL) (2.5 MG/3ML) 0.083% nebulizer solution, Take 2.5 mg by nebulization 4 (four) times daily., Disp: , Rfl:    albuterol (VENTOLIN HFA) 108 (90 Base) MCG/ACT inhaler, Inhale 2 puffs into the lungs every 6 (six) hours as needed. (Patient not taking: Reported on 05/11/2022), Disp: , Rfl:    albuterol (VENTOLIN HFA) 108 (90 Base) MCG/ACT inhaler, Inhale into the lungs., Disp: , Rfl:    alfuzosin (UROXATRAL) 10 MG 24 hr tablet, Take 10 mg by mouth daily. (Patient not taking: Reported on 05/11/2022), Disp: , Rfl:    alfuzosin (UROXATRAL) 10 MG 24 hr tablet, Take 1 tablet by mouth daily., Disp: , Rfl:    aspirin 81 MG chewable tablet, Chew 81 mg by mouth daily. (Patient not taking: Reported on 05/11/2022), Disp: , Rfl:    benzonatate (TESSALON) 200 MG capsule, Take 200 mg by mouth 3 (three) times daily as needed., Disp: , Rfl:    cetirizine (ZYRTEC) 10 MG tablet, Take 10 mg by mouth daily., Disp: , Rfl:    cetirizine (ZYRTEC) 10 MG tablet, Take by mouth. (Patient not taking: Reported on 05/11/2022), Disp: , Rfl:    diltiazem (CARDIZEM CD) 180 MG 24 hr capsule, Take by mouth., Disp: , Rfl:    dutasteride (AVODART) 0.5 MG capsule, Take 0.5 mg by mouth daily., Disp: , Rfl:    fluticasone (FLONASE) 50 MCG/ACT nasal spray, Place into the nose., Disp: , Rfl:    hydrochlorothiazide (HYDRODIURIL) 12.5 MG tablet, Take 12.5 mg by mouth  daily., Disp: , Rfl:    ipratropium-albuterol (DUONEB) 0.5-2.5 (3) MG/3ML SOLN, Take 3 mLs by nebulization 2 (two) times daily. (Patient not taking: Reported on 05/11/2022), Disp: , Rfl:    ipratropium-albuterol (DUONEB) 0.5-2.5 (3) MG/3ML SOLN, Inhale into the lungs., Disp: , Rfl:    loratadine (CLARITIN) 10 MG tablet, Take 1 tablet (10 mg total) by mouth daily. (Patient not taking: Reported on 05/11/2022), Disp: 30 tablet, Rfl: 1   loratadine (CLARITIN) 10 MG tablet, Take 1 tablet by mouth daily as needed., Disp: , Rfl:    LUMIGAN 0.01 % SOLN, Place 1 drop into both eyes every evening., Disp: , Rfl:    Melatonin 10 MG CAPS, Take by mouth., Disp: , Rfl:    naproxen sodium (ALEVE) 220 MG tablet, Take by mouth., Disp: , Rfl:    nystatin (MYCOSTATIN) 100000 UNIT/ML suspension, Take 5 mLs by mouth 4 (four) times daily., Disp: , Rfl:    olmesartan (BENICAR) 20 MG tablet, Take 20 mg by mouth daily., Disp: , Rfl:    predniSONE (DELTASONE) 5 MG tablet, Advised to take prednisone 50 mg daily and wean by 5 mg every day. (Patient not taking: Reported on 05/11/2022), Disp: 55 tablet, Rfl: 0   simvastatin (ZOCOR) 40 MG tablet, Take 40 mg by mouth daily. (Patient not taking: Reported on 05/11/2022), Disp: , Rfl:    simvastatin (ZOCOR) 40 MG tablet, Take 1 tablet by mouth at bedtime., Disp: ,  Rfl:    TRELEGY ELLIPTA 100-62.5-25 MCG/ACT AEPB, Inhale 1 puff into the lungs daily. (Patient not taking: Reported on 05/11/2022), Disp: , Rfl:    TRELEGY ELLIPTA 200-62.5-25 MCG/ACT AEPB, Inhale 1 puff into the lungs daily., Disp: , Rfl:    verapamil (VERELAN PM) 240 MG 24 hr capsule, Take 240 mg by mouth daily. (Patient not taking: Reported on 05/11/2022), Disp: , Rfl:   Past Medical History: Past Medical History:  Diagnosis Date   COPD (chronic obstructive pulmonary disease) (HCC)    Hyperlipidemia    Hypertension     Tobacco Use: Social History   Tobacco Use  Smoking Status Former   Packs/day: 1.00    Years: 42.00   Total pack years: 42.00   Types: Cigarettes   Quit date: 11/01/2021   Years since quitting: 0.6  Smokeless Tobacco Never    Labs: Review Flowsheet       Latest Ref Rng & Units 11/08/2021  Labs for ITP Cardiac and Pulmonary Rehab  Hemoglobin A1c 4.8 - 5.6 % 5.8      Pulmonary Assessment Scores:  Pulmonary Assessment Scores     Row Name 05/15/22 1445         ADL UCSD   ADL Phase Entry     SOB Score total 58     Rest 1     Walk 1     Stairs 3     Bath 3     Dress 2     Shop 3       CAT Score   CAT Score 22       mMRC Score   mMRC Score 1              UCSD: Self-administered rating of dyspnea associated with activities of daily living (ADLs) 6-point scale (0 = "not at all" to 5 = "maximal or unable to do because of breathlessness")  Scoring Scores range from 0 to 120.  Minimally important difference is 5 units  CAT: CAT can identify the health impairment of COPD patients and is better correlated with disease progression.  CAT has a scoring range of zero to 40. The CAT score is classified into four groups of low (less than 10), medium (10 - 20), high (21-30) and very high (31-40) based on the impact level of disease on health status. A CAT score over 10 suggests significant symptoms.  A worsening CAT score could be explained by an exacerbation, poor medication adherence, poor inhaler technique, or progression of COPD or comorbid conditions.  CAT MCID is 2 points  mMRC: mMRC (Modified Medical Research Council) Dyspnea Scale is used to assess the degree of baseline functional disability in patients of respiratory disease due to dyspnea. No minimal important difference is established. A decrease in score of 1 point or greater is considered a positive change.   Pulmonary Function Assessment:  Pulmonary Function Assessment - 05/11/22 1011       Breath   Shortness of Breath Yes;Limiting activity             Exercise Target Goals: Exercise  Program Goal: Individual exercise prescription set using results from initial 6 min walk test and THRR while considering  patient's activity barriers and safety.   Exercise Prescription Goal: Initial exercise prescription builds to 30-45 minutes a day of aerobic activity, 2-3 days per week.  Home exercise guidelines will be given to patient during program as part of exercise prescription that the participant will acknowledge.  Education:  Aerobic Exercise: - Group verbal and visual presentation on the components of exercise prescription. Introduces F.I.T.T principle from ACSM for exercise prescriptions.  Reviews F.I.T.T. principles of aerobic exercise including progression. Written material given at graduation. Flowsheet Row Pulmonary Rehab from 06/21/2022 in [facility] Cardiac and Pulmonary Rehab  Date 06/14/22  Educator [facility]  Instruction Review Code 1- Verbalizes Understanding       Education: Resistance Exercise: - Group verbal and visual presentation on the components of exercise prescription. Introduces F.I.T.T principle from ACSM for exercise prescriptions  Reviews F.I.T.T. principles of resistance exercise including progression. Written material given at graduation. Flowsheet Row Pulmonary Rehab from 06/21/2022 in [facility] Cardiac and Pulmonary Rehab  Date 06/21/22  Educator [facility]  Instruction Review Code 1- Verbalizes Understanding        Education: Exercise & Equipment Safety: - Individual verbal instruction and demonstration of equipment use and safety with use of the equipment. Flowsheet Row Pulmonary Rehab from 06/21/2022 in [facility] Cardiac and Pulmonary Rehab  Date 05/11/22  Educator [facility]  Instruction Review Code 1- Verbalizes Understanding       Education: Exercise Physiology & General Exercise Guidelines: - Group verbal and written instruction with models to review the exercise physiology of the cardiovascular system and associated critical values. Provides general exercise guidelines  with specific guidelines to those with heart or lung disease.    Education: Flexibility, Balance, Mind/Body Relaxation: - Group verbal and visual presentation with interactive activity on the components of exercise prescription. Introduces F.I.T.T principle from ACSM for exercise prescriptions. Reviews F.I.T.T. principles of flexibility and balance exercise training including progression. Also discusses the mind body connection.  Reviews various relaxation techniques to help reduce and manage stress (i.e. Deep breathing, progressive muscle relaxation, and visualization). Balance handout provided to take home. Written material given at graduation. Flowsheet Row Pulmonary Rehab from 06/21/2022 in [facility] Cardiac and Pulmonary Rehab  Date 06/21/22  Educator [facility]  Instruction Review Code 1- Verbalizes Understanding       Activity Barriers & Risk Stratification:  Activity Barriers & Cardiac Risk Stratification - 05/15/22 1435       Activity Barriers & Cardiac Risk Stratification   Activity Barriers Shortness of Breath;Deconditioning;Muscular Weakness             6 Minute Walk:  6 Minute Walk     Row Name 05/15/22 1431         6 Minute Walk   Phase Initial     Distance 1200 feet     Walk Time 6 minutes     # of Rest Breaks 0     MPH 2.27     METS 2.89     RPE 14     Perceived Dyspnea  2     VO2 Peak 10.13     Symptoms Yes (comment)     Comments SOB     Resting HR 89 bpm     Resting BP 128/64     Resting Oxygen Saturation  91 %     Exercise Oxygen Saturation  during 6 min walk 88 %     Max Ex. HR 104 bpm     Max Ex. BP 162/74     2 Minute Post BP 146/74       Interval HR   1 Minute HR 97     2 Minute HR 99     3 Minute HR 100     4 Minute HR 102     5 Minute HR 102  6 Minute HR 104     2 Minute Post HR 96     Interval Heart Rate? Yes       Interval Oxygen   Interval Oxygen? Yes     Baseline Oxygen Saturation % 91 %     1 Minute Oxygen Saturation % 88 %     1  Minute Liters of Oxygen 0 L  Room Air     2 Minute Oxygen Saturation % 90 %     2 Minute Liters of Oxygen 0 L     3 Minute Oxygen Saturation % 89 %     3 Minute Liters of Oxygen 0 L     4 Minute Oxygen Saturation % 90 %     4 Minute Liters of Oxygen 0 L     5 Minute Oxygen Saturation % 90 %     5 Minute Liters of Oxygen 0 L     6 Minute Oxygen Saturation % 90 %     6 Minute Liters of Oxygen 0 L     2 Minute Post Oxygen Saturation % 92 %     2 Minute Post Liters of Oxygen 0 L             Oxygen Initial Assessment:  Oxygen Initial Assessment - 05/11/22 1010       Home Oxygen   Home Oxygen Device Home Concentrator;E-Tanks;Portable Concentrator    Sleep Oxygen Prescription Continuous   Patients states that he could not tolerate CPAP   Liters per minute 2    Home Exercise Oxygen Prescription None    Home Resting Oxygen Prescription None    Compliance with Home Oxygen Use Yes      Initial 6 min Walk   Oxygen Used None      Program Oxygen Prescription   Program Oxygen Prescription None      Intervention   Short Term Goals To learn and understand importance of monitoring SPO2 with pulse oximeter and demonstrate accurate use of the pulse oximeter.;To learn and demonstrate proper pursed lip breathing techniques or other breathing techniques. ;To learn and exhibit compliance with exercise, home and travel O2 prescription;To learn and understand importance of maintaining oxygen saturations>88%;To learn and demonstrate proper use of respiratory medications    Long  Term Goals Exhibits compliance with exercise, home  and travel O2 prescription;Maintenance of O2 saturations>88%;Compliance with respiratory medication;Demonstrates proper use of MDI's;Verbalizes importance of monitoring SPO2 with pulse oximeter and return demonstration;Exhibits proper breathing techniques, such as pursed lip breathing or other method taught during program session             Oxygen Re-Evaluation:   Oxygen Re-Evaluation     Row Name 06/07/22 0759             Program Oxygen Prescription   Program Oxygen Prescription None         Home Oxygen   Home Oxygen Device Home Concentrator;E-Tanks;Portable Concentrator       Sleep Oxygen Prescription Continuous       Liters per minute 2       Home Exercise Oxygen Prescription None       Home Resting Oxygen Prescription None       Compliance with Home Oxygen Use Yes         Goals/Expected Outcomes   Short Term Goals To learn and understand importance of maintaining oxygen saturations>88%;To learn and understand importance of monitoring SPO2 with pulse oximeter and demonstrate accurate use of the pulse  oximeter.       Long  Term Goals Maintenance of O2 saturations>88%;Verbalizes importance of monitoring SPO2 with pulse oximeter and return demonstration       Comments He does have a pulse oximeter to check his oxygen saturation at home. Informed him and explained why it is important to have one. Reviewed that oxygen saturations should be 88 percent and above. Patient verbalizes understanding.       Goals/Expected Outcomes Short: monitor oxygen at home with exertion. Long: maintain oxygen saturations above 88 percent independently.                Oxygen Discharge (Final Oxygen Re-Evaluation):  Oxygen Re-Evaluation - 06/07/22 0759       Program Oxygen Prescription   Program Oxygen Prescription None      Home Oxygen   Home Oxygen Device Home Concentrator;E-Tanks;Portable Concentrator    Sleep Oxygen Prescription Continuous    Liters per minute 2    Home Exercise Oxygen Prescription None    Home Resting Oxygen Prescription None    Compliance with Home Oxygen Use Yes      Goals/Expected Outcomes   Short Term Goals To learn and understand importance of maintaining oxygen saturations>88%;To learn and understand importance of monitoring SPO2 with pulse oximeter and demonstrate accurate use of the pulse oximeter.    Long  Term Goals  Maintenance of O2 saturations>88%;Verbalizes importance of monitoring SPO2 with pulse oximeter and return demonstration    Comments He does have a pulse oximeter to check his oxygen saturation at home. Informed him and explained why it is important to have one. Reviewed that oxygen saturations should be 88 percent and above. Patient verbalizes understanding.    Goals/Expected Outcomes Short: monitor oxygen at home with exertion. Long: maintain oxygen saturations above 88 percent independently.             Initial Exercise Prescription:  Initial Exercise Prescription - 05/15/22 1400       Date of Initial Exercise RX and Referring Provider   Date 05/15/22    Referring Provider Bethann Punches MD      Oxygen   Maintain Oxygen Saturation 88% or higher      Treadmill   MPH 2    Grade 0.5    Minutes 15    METs 2.67      Recumbant Bike   Level 2    RPM 50    Watts 22    Minutes 15    METs 2      Elliptical   Level 1    Speed 2.5    Minutes 15    METs 2      T5 Nustep   Level 2    SPM 80    Minutes 15    METs 2      Biostep-RELP   Level 2    SPM 50    Minutes 15    METs 2      Track   Laps 31    Minutes 15    METs 2.69      Prescription Details   Frequency (times per week) 3    Duration Progress to 30 minutes of continuous aerobic without signs/symptoms of physical distress      Intensity   THRR 40-80% of Max Heartrate 110-132    Ratings of Perceived Exertion 11-13    Perceived Dyspnea 0-4      Progression   Progression Continue to progress workloads to maintain intensity without signs/symptoms of  physical distress.      Resistance Training   Training Prescription Yes    Weight 3 lb    Reps 10-15             Perform Capillary Blood Glucose checks as needed.  Exercise Prescription Changes:   Exercise Prescription Changes     Row Name 05/15/22 1400 05/31/22 1600 06/12/22 1500         Response to Exercise   Blood Pressure (Admit) 128/64  124/62 110/78     Blood Pressure (Exercise) 162/74 178/74 122/60     Blood Pressure (Exit) 126/62 128/62 104/60     Heart Rate (Admit) 89 bpm 88 bpm 72 bpm     Heart Rate (Exercise) 104 bpm 113 bpm 104 bpm     Heart Rate (Exit) 92 bpm 98 bpm 95 bpm     Oxygen Saturation (Admit) 91 % 91 % 92 %     Oxygen Saturation (Exercise) 88 % 85 % 88 %     Oxygen Saturation (Exit) 94 % 92 % 91 %     Rating of Perceived Exertion (Exercise) 14 16 13      Perceived Dyspnea (Exercise) 2 2 1      Symptoms SOB SOB SOB     Comments walk test results First two weeks in rehab --     Duration -- Progress to 30 minutes of  aerobic without signs/symptoms of physical distress Continue with 30 min of aerobic exercise without signs/symptoms of physical distress.     Intensity -- THRR unchanged THRR unchanged       Progression   Progression -- Continue to progress workloads to maintain intensity without signs/symptoms of physical distress. Continue to progress workloads to maintain intensity without signs/symptoms of physical distress.     Average METs -- 2.58 2.61       Resistance Training   Training Prescription -- Yes Yes     Weight -- 3 lb 3 lb     Reps -- 10-15 10-15       Interval Training   Interval Training -- No No       Treadmill   MPH -- 2 1.5     Grade -- 0.5 0     Minutes -- 15 15     METs -- 2.67 2.15       Recumbant Bike   Level -- 3 3     Watts -- 23 23     Minutes -- 15 15     METs -- 2.89 2.87       NuStep   Level -- 3 --     Minutes -- 15 --     METs -- 3 --       REL-XR   Level -- -- 2     Minutes -- -- 15     METs -- -- 3.8       T5 Nustep   Level -- 3 3     Minutes -- 15 15     METs -- 2 2       Track   Laps -- 31 --     Minutes -- 15 --     METs -- 2.69 --       Oxygen   Maintain Oxygen Saturation -- 88% or higher 88% or higher              Exercise Comments:   Exercise Goals and Review:   Exercise Goals     Row  Name 05/15/22 1443              Exercise Goals   Increase Physical Activity Yes       Intervention Provide advice, education, support and counseling about physical activity/exercise needs.;Develop an individualized exercise prescription for aerobic and resistive training based on initial evaluation findings, risk stratification, comorbidities and participant's personal goals.       Expected Outcomes Short Term: Attend rehab on a regular basis to increase amount of physical activity.;Long Term: Add in home exercise to make exercise part of routine and to increase amount of physical activity.;Long Term: Exercising regularly at least 3-5 days a week.       Increase Strength and Stamina Yes       Intervention Provide advice, education, support and counseling about physical activity/exercise needs.;Develop an individualized exercise prescription for aerobic and resistive training based on initial evaluation findings, risk stratification, comorbidities and participant's personal goals.       Expected Outcomes Short Term: Increase workloads from initial exercise prescription for resistance, speed, and METs.;Short Term: Perform resistance training exercises routinely during rehab and add in resistance training at home;Long Term: Improve cardiorespiratory fitness, muscular endurance and strength as measured by increased METs and functional capacity (6MWT)       Able to understand and use rate of perceived exertion (RPE) scale Yes       Intervention Provide education and explanation on how to use RPE scale       Expected Outcomes Short Term: Able to use RPE daily in rehab to express subjective intensity level;Long Term:  Able to use RPE to guide intensity level when exercising independently       Able to understand and use Dyspnea scale Yes       Intervention Provide education and explanation on how to use Dyspnea scale       Expected Outcomes Long Term: Able to use Dyspnea scale to guide intensity level when exercising independently;Short Term:  Able to use Dyspnea scale daily in rehab to express subjective sense of shortness of breath during exertion       Knowledge and understanding of Target Heart Rate Range (THRR) Yes       Intervention Provide education and explanation of THRR including how the numbers were predicted and where they are located for reference       Expected Outcomes Short Term: Able to state/look up THRR;Short Term: Able to use daily as guideline for intensity in rehab;Long Term: Able to use THRR to govern intensity when exercising independently       Able to check pulse independently Yes       Intervention Review the importance of being able to check your own pulse for safety during independent exercise;Provide education and demonstration on how to check pulse in carotid and radial arteries.       Expected Outcomes Long Term: Able to check pulse independently and accurately;Short Term: Able to explain why pulse checking is important during independent exercise       Understanding of Exercise Prescription Yes       Intervention Provide education, explanation, and written materials on patient's individual exercise prescription       Expected Outcomes Short Term: Able to explain program exercise prescription;Long Term: Able to explain home exercise prescription to exercise independently                Exercise Goals Re-Evaluation :  Exercise Goals Re-Evaluation     Row Name 05/31/22 1626 06/12/22 1600  Exercise Goal Re-Evaluation   Exercise Goals Review Increase Physical Activity;Increase Strength and Stamina;Understanding of Exercise Prescription Increase Physical Activity;Increase Strength and Stamina;Understanding of Exercise Prescription      Comments Earl LitesGregory is off to a good start in rehab. He had an overall average MET level of 2.58 METs during his first two weeks attending rehab. He also was able to follow his exercise prescription for the treadmill at a speed of 2 mph and an incline of 0.5%. He  was able to increase his workloads to level 3 on both the T5 and the recumbent bike as well. We will continue to monitor his progress. Tammy SoursGreg continues to do well in rehab. He tried out the XR and was able to do level 2. He has stayed consistant working at 23 watts on the recumbent bike and we hope to see that improve. We would also recommend putting that incline back on the treadmill workload. RPEs are staying appropriate and oxygen saturations are staying above 88%. Will continue to monitor.      Expected Outcomes Short: Continue to follow exercise prescription. Long: Continue to attend pulmonary rehab regularly. Short: Add in incline back to treadmill, increase watts on recumbent bike Long: Continue to increase overall MET level               Discharge Exercise Prescription (Final Exercise Prescription Changes):  Exercise Prescription Changes - 06/12/22 1500       Response to Exercise   Blood Pressure (Admit) 110/78    Blood Pressure (Exercise) 122/60    Blood Pressure (Exit) 104/60    Heart Rate (Admit) 72 bpm    Heart Rate (Exercise) 104 bpm    Heart Rate (Exit) 95 bpm    Oxygen Saturation (Admit) 92 %    Oxygen Saturation (Exercise) 88 %    Oxygen Saturation (Exit) 91 %    Rating of Perceived Exertion (Exercise) 13    Perceived Dyspnea (Exercise) 1    Symptoms SOB    Duration Continue with 30 min of aerobic exercise without signs/symptoms of physical distress.    Intensity THRR unchanged      Progression   Progression Continue to progress workloads to maintain intensity without signs/symptoms of physical distress.    Average METs 2.61      Resistance Training   Training Prescription Yes    Weight 3 lb    Reps 10-15      Interval Training   Interval Training No      Treadmill   MPH 1.5    Grade 0    Minutes 15    METs 2.15      Recumbant Bike   Level 3    Watts 23    Minutes 15    METs 2.87      REL-XR   Level 2    Minutes 15    METs 3.8      T5 Nustep    Level 3    Minutes 15    METs 2      Oxygen   Maintain Oxygen Saturation 88% or higher             Nutrition:  Target Goals: Understanding of nutrition guidelines, daily intake of sodium 1500mg , cholesterol 200mg , calories 30% from fat and 7% or less from saturated fats, daily to have 5 or more servings of fruits and vegetables.  Education: All About Nutrition: -Group instruction provided by verbal, written material, interactive activities, discussions, models, and posters to present general  guidelines for heart healthy nutrition including fat, fiber, MyPlate, the role of sodium in heart healthy nutrition, utilization of the nutrition label, and utilization of this knowledge for meal planning. Follow up email sent as well. Written material given at graduation.   Biometrics:  Pre Biometrics - 05/15/22 1444       Pre Biometrics   Height 5' 11.4" (1.814 m)    Weight 176 lb 14.4 oz (80.2 kg)    Waist Circumference 36.25 inches    Hip Circumference 37.5 inches    Waist to Hip Ratio 0.97 %    BMI (Calculated) 24.38    Single Leg Stand 21.5 seconds              Nutrition Therapy Plan and Nutrition Goals:  Nutrition Therapy & Goals - 05/15/22 0920       Nutrition Therapy   RD appointment deferred Yes   Pt would not like to meet with RD at this time, will continue to follow up.     Personal Nutrition Goals   Nutrition Goal Pt would not like to meet with RD at this time, will continue to follow up.      Intervention Plan   Intervention Prescribe, educate and counsel regarding individualized specific dietary modifications aiming towards targeted core components such as weight, hypertension, lipid management, diabetes, heart failure and other comorbidities.;Nutrition handout(s) given to patient.    Expected Outcomes Short Term Goal: Understand basic principles of dietary content, such as calories, fat, sodium, cholesterol and nutrients.;Short Term Goal: A plan has been  developed with personal nutrition goals set during dietitian appointment.;Long Term Goal: Adherence to prescribed nutrition plan.             Nutrition Assessments:  MEDIFICTS Score Key: ?70 Need to make dietary changes  40-70 Heart Healthy Diet ? 40 Therapeutic Level Cholesterol Diet  Flowsheet Row Pulmonary Rehab from 05/15/2022 in [facility] Cardiac and Pulmonary Rehab  Picture Your Plate Total Score on Admission 69      Picture Your Plate Scores: <81 Unhealthy dietary pattern with much room for improvement. 41-50 Dietary pattern unlikely to meet recommendations for good health and room for improvement. 51-60 More healthful dietary pattern, with some room for improvement.  >60 Healthy dietary pattern, although there may be some specific behaviors that could be improved.   Nutrition Goals Re-Evaluation:  Nutrition Goals Re-Evaluation     Row Name 06/07/22 0801             Goals   Current Weight 181 lb (82.1 kg)       Comment Patient was informed on why it is important to maintain a balanced diet when dealing with Respiratory issues. Explained that it takes a lot of energy to breath and when they are short of breath often they will need to have a good diet to help keep up with the calories they are expending for breathing.       Expected Outcome Short: Choose and plan snacks accordingly to patients caloric intake to improve breathing. Long: Maintain a diet independently that meets their caloric intake to aid in daily shortness of breath.                Nutrition Goals Discharge (Final Nutrition Goals Re-Evaluation):  Nutrition Goals Re-Evaluation - 06/07/22 0801       Goals   Current Weight 181 lb (82.1 kg)    Comment Patient was informed on why it is important to maintain a balanced diet when dealing  with Respiratory issues. Explained that it takes a lot of energy to breath and when they are short of breath often they will need to have a good diet to help keep up with  the calories they are expending for breathing.    Expected Outcome Short: Choose and plan snacks accordingly to patients caloric intake to improve breathing. Long: Maintain a diet independently that meets their caloric intake to aid in daily shortness of breath.             Psychosocial: Target Goals: Acknowledge presence or absence of significant depression and/or stress, maximize coping skills, provide positive support system. Participant is able to verbalize types and ability to use techniques and skills needed for reducing stress and depression.   Education: Stress, Anxiety, and Depression - Group verbal and visual presentation to define topics covered.  Reviews how body is impacted by stress, anxiety, and depression.  Also discusses healthy ways to reduce stress and to treat/manage anxiety and depression.  Written material given at graduation. Flowsheet Row Pulmonary Rehab from 06/21/2022 in [facility] Cardiac and Pulmonary Rehab  Date 05/31/22  Educator [facility]  Instruction Review Code 1- [employer] Understanding       Education: Sleep Hygiene -Provides group verbal and written instruction about how sleep can affect your health.  Define sleep hygiene, discuss sleep cycles and impact of sleep habits. Review good sleep hygiene tips.    Initial Review & Psychosocial Screening:  Initial Psych Review & Screening - 05/11/22 1013       Initial Review   Current issues with None Identified      Family Dynamics   Good Support System? Yes    Comments Marya Amsler can look to his wife and 4 boys for support. He states no stressors at the moment and wants to get his breathing better.      Barriers   Psychosocial barriers to participate in program There are no identifiable barriers or psychosocial needs.;The patient should benefit from training in stress management and relaxation.      Screening Interventions   Interventions Encouraged to exercise;To provide support and resources with identified  psychosocial needs;Provide feedback about the scores to participant    Expected Outcomes Short Term goal: Utilizing psychosocial counselor, staff and physician to assist with identification of specific Stressors or current issues interfering with healing process. Setting desired goal for each stressor or current issue identified.;Long Term Goal: Stressors or current issues are controlled or eliminated.;Short Term goal: Identification and review with participant of any Quality of Life or Depression concerns found by scoring the questionnaire.;Long Term goal: The participant improves quality of Life and PHQ9 Scores as seen by post scores and/or verbalization of changes             Quality of Life Scores:  Scores of 19 and below usually indicate a poorer quality of life in these areas.  A difference of  2-3 points is a clinically meaningful difference.  A difference of 2-3 points in the total score of the Quality of Life Index has been associated with significant improvement in overall quality of life, self-image, physical symptoms, and general health in studies assessing change in quality of life.  PHQ-9: Review Flowsheet       06/07/2022 05/15/2022  Depression screen PHQ 2/9  Decreased Interest 0 0  Down, Depressed, Hopeless 0 0  PHQ - 2 Score 0 0  Altered sleeping 1 0  Tired, decreased energy 3 2  Change in appetite 0 1  Feeling  bad or failure about yourself  0 1  Trouble concentrating 0 1  Moving slowly or fidgety/restless 0 0  Suicidal thoughts 0 0  PHQ-9 Score 4 5  Difficult doing work/chores Not difficult at all Somewhat difficult   Interpretation of Total Score  Total Score Depression Severity:  1-4 = Minimal depression, 5-9 = Mild depression, 10-14 = Moderate depression, 15-19 = Moderately severe depression, 20-27 = Severe depression   Psychosocial Evaluation and Intervention:  Psychosocial Evaluation - 05/11/22 1014       Psychosocial Evaluation & Interventions    Interventions Relaxation education;Encouraged to exercise with the program and follow exercise prescription;Stress management education    Comments Tammy Sours can look to his wife and 4 boys for support. He states no stressors at the moment and wants to get his breathing better.    Expected Outcomes Short: Start [facility] to help with mood. Long: Maintain a healthy mental state.    Continue Psychosocial Services  Follow up required by staff             Psychosocial Re-Evaluation:  Psychosocial Re-Evaluation     Row Name 06/07/22 0805             Psychosocial Re-Evaluation   Current issues with None Identified       Comments Reviewed patient health questionnaire (PHQ-9) with patient for follow up. Previously, patients score indicated signs/symptoms of depression.  Reviewed to see if patient is improving symptom wise while in program.  Score improved and patient states that it is because he has been more routine with working out.       Expected Outcomes Short: Continue to attend [facility] regularly for regular exercise and social engagement. Long: Continue to improve symptoms and manage a positive mental state.       Interventions Encouraged to attend Pulmonary Rehabilitation for the exercise       Continue Psychosocial Services  No Follow up required                Psychosocial Discharge (Final Psychosocial Re-Evaluation):  Psychosocial Re-Evaluation - 06/07/22 0805       Psychosocial Re-Evaluation   Current issues with None Identified    Comments Reviewed patient health questionnaire (PHQ-9) with patient for follow up. Previously, patients score indicated signs/symptoms of depression.  Reviewed to see if patient is improving symptom wise while in program.  Score improved and patient states that it is because he has been more routine with working out.    Expected Outcomes Short: Continue to attend [facility] regularly for regular exercise and social engagement. Long: Continue to  improve symptoms and manage a positive mental state.    Interventions Encouraged to attend Pulmonary Rehabilitation for the exercise    Continue Psychosocial Services  No Follow up required             Education: Education Goals: Education classes will be provided on a weekly basis, covering required topics. Participant will state understanding/return demonstration of topics presented.  Learning Barriers/Preferences:  Learning Barriers/Preferences - 05/11/22 1012       Learning Barriers/Preferences   Learning Barriers None    Learning Preferences None             General Pulmonary Education Topics:  Infection Prevention: - Provides verbal and written material to individual with discussion of infection control including proper hand washing and proper equipment cleaning during exercise session. Flowsheet Row Pulmonary Rehab from 06/21/2022 in [facility] Cardiac and Pulmonary Rehab  Date 05/11/22  Educ[facility]  Instruction Review Code 1- Verbalizes Understanding       Falls Prevention: - Provides verbal and written material to individual with discussion of falls prevention and safety. Flowsheet Row [facility]monary [facility]  Date 05/11/22  [facility]  Instruction Review Code 1- Verbalizes Understanding       Chronic Lung Disease Review: - Group verbal instruction with posters, models, PowerPoint presentations and videos,  to review new updates, new respiratory medications, new advancements in procedures and treatments. Providing information on websites and "800" numbers for continued self-education. Includes information about supplement oxygen, available portable oxygen systems, continuous and intermittent flow rates, oxygen safety, concentrators, and Medicare reimbursement for oxygen. Explanation of Pulmonary Drugs, including class, frequency, complications, importance of spacers, rinsing mouth after steroid MDI's, and proper cleaning  methods for nebulizers. Review of basic lung anatomy and physiology related to function, structure, and complications of lung disease. Review of risk factors. Discussion about methods for diagnosing sleep apnea and types of masks and machines for OSA. Includes a review of the use of types of environmental controls: home humidity, furnaces, filters, dust mite/pet prevention, HEPA vacuums. Discussion about weather changes, air quality and the benefits of nasal washing. Instruction on Warning signs, infection symptoms, calling MD promptly, preventive modes, and value of vaccinations. Review of effective airway clearance, coughing and/or vibration techniques. Emphasizing that all should Create an Action Plan. Written material given at graduation. Flowsheet Row Pulmonary Rehab [facility]21[facility]20[facility]  Education need identified 05/15/22  Date 05/24/22  [facility]ator [facility]  Instruction Review Code 1- Verbalizes Understanding       AED/CPR: - Group verbal and written instruction with the use of models to demonstrate the basic use of the AED with the basic ABC's of resuscitation.    Anatomy and Cardiac Procedures: - Group verbal and visual presentation and models provide information about basic cardiac anatomy and function. Reviews the testing methods done to diagnose heart disease and the outcomes of the test results. Describes the treatment choices: Medical Management, Angioplasty, or Coronary Bypass Surgery for treating various heart conditions including Myocardial Infarction, Angina, Valve Disease, and Cardiac Arrhythmias.  Written material given at graduation.   Medication Safety: - Group verbal and visual instruction to review commonly prescribed medications for heart and lung disease. Reviews the medication, class of the drug, and side effects. Includes the steps to properly store meds and maintain the prescription regimen.  Written material given at  graduation.   Other: -Provides group and verbal instruction on various topics (see comments)   Knowledge Questionnaire Score:  Knowledge Questionnaire Score - 05/15/22 1444       Knowledge Questionnaire Score   Pre Score 15/18              Core Components/Risk Factors/Patient Goals at Admission:  Personal Goals and Risk Factors at Admission - 05/15/22 1444       Core Components/Risk Factors/Patient Goals on Admission    Weight Management Yes;Weight Maintenance    Intervention Weight Management: Develop a combined nutrition and exercise program designed to reach desired caloric intake, while maintaining appropriate intake of nutrient and fiber, sodium and fats, and appropriate energy expenditure required for the weight goal.;Weight Management: Provide education and appropriate resources to help participant work on and attain dietary goals.;Weight Management/Obesity: Establish reasonable short term and long term weight goals.    Admit Weight 176 lb 14.4 oz (80.2 kg)    Goal Weight: Short  Term 176 lb (79.8 kg)    Goal Weight: Long Term 175 lb (79.4 kg)    Expected Outcomes Short Term: Continue to assess and modify interventions until short term weight is achieved;Long Term: Adherence to nutrition and physical activity/exercise program aimed toward attainment of established weight goal;Weight Maintenance: Understanding of the daily nutrition guidelines, which includes 25-35% calories from fat, 7% or less cal from saturated fats, less than 200mg  cholesterol, less than 1.5gm of sodium, & 5 or more servings of fruits and vegetables daily    Improve shortness of breath with ADL's Yes    Intervention Provide education, individualized exercise plan and daily activity instruction to help decrease symptoms of SOB with activities of daily living.    Expected Outcomes Short Term: Improve cardiorespiratory fitness to achieve a reduction of symptoms when performing ADLs;Long Term: Be able to perform  more ADLs without symptoms or delay the onset of symptoms    Increase knowledge of respiratory medications and ability to use respiratory devices properly  Yes    Intervention Provide education and demonstration as needed of appropriate use of medications, inhalers, and oxygen therapy.    Expected Outcomes Short Term: Achieves understanding of medications use. Understands that oxygen is a medication prescribed by physician. Demonstrates appropriate use of inhaler and oxygen therapy.;Long Term: Maintain appropriate use of medications, inhalers, and oxygen therapy.    Hypertension Yes    Intervention Provide education on lifestyle modifcations including regular physical activity/exercise, weight management, moderate sodium restriction and increased consumption of fresh fruit, vegetables, and low fat dairy, alcohol moderation, and smoking cessation.;Monitor prescription use compliance.    Expected Outcomes Short Term: Continued assessment and intervention until BP is < 140/18mm HG in hypertensive participants. < 130/17mm HG in hypertensive participants with diabetes, heart failure or chronic kidney disease.;Long Term: Maintenance of blood pressure at goal levels.    Lipids Yes    Intervention Provide education and support for participant on nutrition & aerobic/resistive exercise along with prescribed medications to achieve LDL 70mg , HDL >40mg .    Expected Outcomes Short Term: Participant states understanding of desired cholesterol values and is compliant with medications prescribed. Participant is following exercise prescription and nutrition guidelines.;Long Term: Cholesterol controlled with medications as prescribed, with individualized exercise RX and with personalized nutrition plan. Value goals: LDL < 70mg , HDL > 40 mg.             Education:Diabetes - Individual verbal and written instruction to review signs/symptoms of diabetes, desired ranges of glucose level fasting, after meals and with  exercise. Acknowledge that pre and post exercise glucose checks will be done for 3 sessions at entry of program.   Know Your Numbers and Heart Failure: - Group verbal and visual instruction to discuss disease risk factors for cardiac and pulmonary disease and treatment options.  Reviews associated critical values for Overweight/Obesity, Hypertension, Cholesterol, and Diabetes.  Discusses basics of heart failure: signs/symptoms and treatments.  Introduces Heart Failure Zone chart for action plan for heart failure.  Written material given at graduation. Flowsheet Row Pulmonary Rehab from 06/21/2022 in [facility] Cardiac and Pulmonary Rehab  Date 05/17/22  Educator [facility]  Instruction Review Code 1- Verbalizes Understanding       Core Components/Risk Factors/Patient Goals Review:   Goals and Risk Factor Review     Row Name 06/07/22 0802             Core Components/Risk Factors/Patient Goals Review   Personal Goals Review Improve shortness of breath with ADL's  Review Spoke to patient about their shortness of breath and what they can do to improve. Patient has been informed of breathing techniques when starting the program. Patient is informed to tell staff if they have had any med changes and that certain meds they are taking or not taking can be causing shortness of breath.       Expected Outcomes Short: Attend [facility] regularly to improve shortness of breath with ADL's. Long: maintain independence with ADL's                Core Components/Risk Factors/Patient Goals at Discharge (Final Review):   Goals and Risk Factor Review - 06/07/22 0802       Core Components/Risk Factors/Patient Goals Review   Personal Goals Review Improve shortness of breath with ADL's    Review Spoke to patient about their shortness of breath and what they can do to improve. Patient has been informed of breathing techniques when starting the program. Patient is informed to tell staff if they have had any med  changes and that certain meds they are taking or not taking can be causing shortness of breath.    Expected Outcomes Short: Attend [facility] regularly to improve shortness of breath with ADL's. Long: maintain independence with ADL's             ITP Comments:  ITP Comments     Row Name 05/11/22 1010 05/15/22 1430 05/24/22 1123 06/21/22 1057     ITP Comments Virtual Visit completed. Patient informed on EP and RD appointment and 6 Minute walk test. Patient also informed of patient health questionnaires on My Chart. Patient Verbalizes understanding. Visit diagnosis can be found in [facility] 05/08/2022. Completed 6MWT and gym orientation. Initial ITP created and sent for review to Dr. Zetta Bills, Medical Director. 30 Day review completed. Medical Director ITP review done, changes made as directed, and signed approval by Medical Director.    new to program 30 Day review completed. Medical Director ITP review done, changes made as directed, and signed approval by Medical Director.             Comments:

## 2022-06-23 ENCOUNTER — Encounter: Payer: Medicare PPO | Admitting: *Deleted

## 2022-06-23 DIAGNOSIS — J449 Chronic obstructive pulmonary disease, unspecified: Secondary | ICD-10-CM

## 2022-06-23 NOTE — Progress Notes (Signed)
Daily Session Note  Patient Details  Name: Bryan Mcclain MRN: [id number] Date of Birth: 1946/01/03 Referring Provider:   April Manson Pulmonary Rehab from 05/15/2022 in [facility] Cardiac and Pulmonary Rehab  Referring Provider Emily Filbert MD       Encounter Date: 06/23/2022  Check In:  Session Check In - 06/23/22 0837       Check-In   Supervising physician immediately available to respond to emergencies See telemetry face sheet for immediately available ER MD    Location [facility]-Cardiac & Pulmonary Rehab    Staff Present Heath Lark, RN, BSN, CCRP;Jessica Lynden, MA, RCEP, CCRP, CCET;Joseph [place], [place]    Virtual Visit No    Medication changes reported     No    Fall or balance concerns reported    No    Warm-up and Cool-down Performed on first and last piece of equipment    Resistance Training Performed Yes    VAD Patient? No    PAD/SET Patient? No      Pain Assessment   Currently in Pain? No/denies                Social History   Tobacco Use  Smoking Status Former   Packs/day: 1.00   Years: 42.00   Total pack years: 42.00   Types: Cigarettes   Quit date: 11/01/2021   Years since quitting: 0.6  Smokeless Tobacco Never    Goals Met:  Proper associated with RPD/PD & O2 Sat Independence with exercise equipment Exercise tolerated well No report of concerns or symptoms today  Goals Unmet:  Not Applicable  Comments: Pt able to follow exercise prescription today without complaint.  Will continue to monitor for progression.    Dr. Emily Filbert is Medical Director for [place].  Dr. Ottie Glazier is Medical Director for [facility] Pulmonary Rehabilitation.

## 2022-06-26 ENCOUNTER — Encounter: Payer: Medicare PPO | Admitting: *Deleted

## 2022-06-26 DIAGNOSIS — J449 Chronic obstructive pulmonary disease, unspecified: Secondary | ICD-10-CM

## 2022-06-26 NOTE — Progress Notes (Signed)
Daily Session Note  Patient Details  Name: Bryan Mcclain MRN: [id number] Date of Birth: August 16, 1945 Referring Provider:   April Manson Pulmonary Rehab from 05/15/2022 in [facility] Cardiac and Pulmonary Rehab  Referring Provider Emily Filbert MD       Encounter Date: 06/26/2022  Check In:  Session Check In - 06/26/22 0751       Check-In   Supervising physician immediately available to respond to emergencies See telemetry face sheet for immediately available ER MD    Location [facility]-Cardiac & Pulmonary Rehab    Staff Present Darlyne Russian, RN, Doyce Para, BS, ACSM CEP, Exercise Physiologist;Jessica Luan Pulling, MA, RCEP, CCRP, CCET;Joseph Rankin, [place]    Virtual Visit No    Medication changes reported     No    Fall or balance concerns reported    No    Warm-up and Cool-down Performed on first and last piece of equipment    Resistance Training Performed Yes    VAD Patient? No    PAD/SET Patient? No      Pain Assessment   Currently in Pain? No/denies                Social History   Tobacco Use  Smoking Status Former   Packs/day: 1.00   Years: 42.00   Total pack years: 42.00   Types: Cigarettes   Quit date: 11/01/2021   Years since quitting: 0.6  Smokeless Tobacco Never    Goals Met:  Independence with exercise equipment Exercise tolerated well No report of concerns or symptoms today Strength training completed today  Goals Unmet:  Not Applicable  Comments: Pt able to follow exercise prescription today without complaint.  Will continue to monitor for progression.    Dr. Emily Filbert is Medical Director for [place].  Dr. Ottie Glazier is Medical Director for [facility] Pulmonary Rehabilitation.

## 2022-06-28 ENCOUNTER — Encounter: Payer: Medicare PPO | Admitting: *Deleted

## 2022-06-28 DIAGNOSIS — J449 Chronic obstructive pulmonary disease, unspecified: Secondary | ICD-10-CM

## 2022-06-28 NOTE — Progress Notes (Signed)
Daily Session Note  Patient Details  Name: Bryan Mcclain MRN: [id number] Date of Birth: 1945-11-30 Referring Provider:   April Manson Pulmonary Rehab from 05/15/2022 in [facility] Cardiac and Pulmonary Rehab  Referring Provider Emily Filbert MD       Encounter Date: 06/28/2022  Check In:  Session Check In - 06/28/22 0758       Check-In   Supervising physician immediately available to respond to emergencies See telemetry face sheet for immediately available ER MD    Location [facility]-Cardiac & Pulmonary Rehab    Staff Present Darlyne Russian, RN, ADN;Jessica Luan Pulling, MA, RCEP, CCRP, CCET;Noah Tickle, BS, Exercise Physiologist    Virtual Visit No    Medication changes reported     No    Fall or balance concerns reported    No    Warm-up and Cool-down Performed on first and last piece of equipment    Resistance Training Performed Yes    VAD Patient? No    PAD/SET Patient? No      Pain Assessment   Currently in Pain? No/denies                Social History   Tobacco Use  Smoking Status Former   Packs/day: 1.00   Years: 42.00   Total pack years: 42.00   Types: Cigarettes   Quit date: 11/01/2021   Years since quitting: 0.6  Smokeless Tobacco Never    Goals Met:  Independence with exercise equipment Exercise tolerated well No report of concerns or symptoms today Strength training completed today  Goals Unmet:  Not Applicable  Comments: Pt able to follow exercise prescription today without complaint.  Will continue to monitor for progression.    Dr. Emily Filbert is Medical Director for [place].  Dr. Ottie Glazier is Medical Director for [facility] Pulmonary Rehabilitation.

## 2022-06-30 ENCOUNTER — Encounter: Payer: Medicare PPO | Attending: Internal Medicine | Admitting: *Deleted

## 2022-06-30 DIAGNOSIS — J449 Chronic obstructive pulmonary disease, unspecified: Secondary | ICD-10-CM | POA: Insufficient documentation

## 2022-06-30 NOTE — Progress Notes (Signed)
Daily Session Note  Patient Details  Name: Bryan Mcclain MRN: [id number] Date of Birth: Sep 04, 1945 Referring Provider:   April Manson Pulmonary Rehab from 05/15/2022 in [facility] Cardiac and Pulmonary Rehab  Referring Provider Emily Filbert MD       Encounter Date: 06/30/2022  Check In:  Session Check In - 06/30/22 0831       Check-In   Supervising physician immediately available to respond to emergencies See telemetry face sheet for immediately available ER MD    Location [facility]-Cardiac & Pulmonary Rehab    Staff Present Heath Lark, RN, BSN, CCRP;Jessica [place], MA, RCEP, CCRP, CCET;Joseph [place], [place]    Virtual Visit No    Medication changes reported     No    Fall or balance concerns reported    No    Warm-up and Cool-down Performed on first and last piece of equipment    Resistance Training Performed Yes    VAD Patient? No    PAD/SET Patient? No      Pain Assessment   Currently in Pain? No/denies                Social History   Tobacco Use  Smoking Status Former   Packs/day: 1.00   Years: 42.00   Total pack years: 42.00   Types: Cigarettes   Quit date: 11/01/2021   Years since quitting: 0.6  Smokeless Tobacco Never    Goals Met:  Proper associated with RPD/PD & O2 Sat Independence with exercise equipment Exercise tolerated well No report of concerns or symptoms today  Goals Unmet:  Not Applicable  Comments: Pt able to follow exercise prescription today without complaint.  Will continue to monitor for progression.    Dr. Emily Filbert is Medical Director for [place].  Dr. Ottie Glazier is Medical Director for [facility] Pulmonary Rehabilitation.

## 2022-07-03 ENCOUNTER — Encounter: Payer: Medicare PPO | Admitting: *Deleted

## 2022-07-03 DIAGNOSIS — J449 Chronic obstructive pulmonary disease, unspecified: Secondary | ICD-10-CM | POA: Diagnosis not present

## 2022-07-03 NOTE — Progress Notes (Signed)
Daily Session Note  Patient Details  Name: Bryan Mcclain MRN: [id number] Date of Birth: July 03, 1945 Referring Provider:   April Manson Pulmonary Rehab from 05/15/2022 in [facility] Cardiac and Pulmonary Rehab  Referring Provider Emily Filbert MD       Encounter Date: 07/03/2022  Check In:  Session Check In - 07/03/22 0817       Check-In   Supervising physician immediately available to respond to emergencies See telemetry face sheet for immediately available ER MD    Location [facility]-Cardiac & Pulmonary Rehab    Staff Present Darlyne Russian, RN, Doyce Para, BS, ACSM CEP, Exercise Physiologist;Joseph Tessie Fass, [place]    Virtual Visit No    Medication changes reported     No    Fall or balance concerns reported    No    Warm-up and Cool-down Performed on first and last piece of equipment    Resistance Training Performed Yes    VAD Patient? No    PAD/SET Patient? No      Pain Assessment   Currently in Pain? No/denies                Social History   Tobacco Use  Smoking Status Former   Packs/day: 1.00   Years: 42.00   Total pack years: 42.00   Types: Cigarettes   Quit date: 11/01/2021   Years since quitting: 0.6  Smokeless Tobacco Never    Goals Met:  Independence with exercise equipment Exercise tolerated well No report of concerns or symptoms today Strength training completed today  Goals Unmet:  Not Applicable  Comments: Pt able to follow exercise prescription today without complaint.  Will continue to monitor for progression.    Dr. Emily Filbert is Medical Director for [place].  Dr. Ottie Glazier is Medical Director for [facility] Pulmonary Rehabilitation.

## 2022-07-05 ENCOUNTER — Encounter: Payer: Medicare PPO | Admitting: *Deleted

## 2022-07-05 DIAGNOSIS — J449 Chronic obstructive pulmonary disease, unspecified: Secondary | ICD-10-CM | POA: Diagnosis not present

## 2022-07-05 NOTE — Progress Notes (Signed)
Daily Session Note  Patient Details  Name: Bryan Mcclain MRN: [id number] Date of Birth: [birth year] Referring Provider:   April Manson Pulmonary Rehab from 05/15/2022 in [facility] Cardiac and Pulmonary Rehab  Referring Provider Emily Filbert MD       Encounter Date: 07/05/2022  Check In:  Session Check In - 07/05/22 0805       Check-In   Supervising physician immediately available to respond to emergencies See telemetry face sheet for immediately available ER MD    Location [facility]-Cardiac & Pulmonary Rehab    Staff Present Darlyne Russian, RN, Lorin [place]cy, MS, ACSM CEP, Exercise Physiologist;Noah Tickle, BS, Exercise Physiologist    Virtual Visit No    Medication changes reported     No    Fall or balance concerns reported    No    Warm-up and Cool-down Performed on first and last piece of equipment    Resistance Training Performed Yes    VAD Patient? No    PAD/SET Patient? No      Pain Assessment   Currently in Pain? No/denies                Social History   Tobacco Use  Smoking Status Former   Packs/day: 1.00   Years: 42.00   Total pack years: 42.00   Types: Cigarettes   Quit date: 11/01/2021   Years since quitting: 0.6  Smokeless Tobacco Never    Goals Met:  Independence with exercise equipment Exercise tolerated well No report of concerns or symptoms today Strength training completed today  Goals Unmet:  Not Applicable  Comments: Pt able to follow exercise prescription today without complaint.  Will continue to monitor for progression.    Dr. Emily Filbert is Medical Director for [place].  Dr. Ottie Glazier is Medical Director for [facility] Pulmonary Rehabilitation.

## 2022-07-07 ENCOUNTER — Encounter: Payer: Medicare PPO | Admitting: *Deleted

## 2022-07-07 DIAGNOSIS — J449 Chronic obstructive pulmonary disease, unspecified: Secondary | ICD-10-CM

## 2022-07-07 NOTE — Progress Notes (Signed)
Daily Session Note  Patient Details  Name: Bryan Mcclain MRN: [id number] Date of Birth: 1945/08/17 Referring Provider:   April Manson Pulmonary Rehab from 05/15/2022 in [facility] Cardiac and Pulmonary Rehab  Referring Provider Emily Filbert MD       Encounter Date: 07/07/2022  Check In:  Session Check In - 07/07/22 0920       Check-In   Supervising physician immediately available to respond to emergencies See telemetry face sheet for immediately available ER MD    Location [facility]-Cardiac & Pulmonary Rehab    Staff Present Heath Lark, RN, BSN, CCRP;Jessica [place], MA, RCEP, CCRP, CCET;Joseph [place], [place]    Virtual Visit No    Medication changes reported     No    Fall or balance concerns reported    No    Warm-up and Cool-down Performed on first and last piece of equipment    Resistance Training Performed Yes    VAD Patient? No    PAD/SET Patient? No      Pain Assessment   Currently in Pain? No/denies                Social History   Tobacco Use  Smoking Status Former   Packs/day: 1.00   Years: 42.00   Total pack years: 42.00   Types: Cigarettes   Quit date: 11/01/2021   Years since quitting: 0.6  Smokeless Tobacco Never    Goals Met:  Proper associated with RPD/PD & O2 Sat Independence with exercise equipment Exercise tolerated well No report of concerns or symptoms today  Goals Unmet:  Not Applicable  Comments: Pt able to follow exercise prescription today without complaint.  Will continue to monitor for progression.    Dr. Emily Filbert is Medical Director for [place].  Dr. Ottie Glazier is Medical Director for [facility] Pulmonary Rehabilitation.

## 2022-07-10 ENCOUNTER — Encounter: Payer: Medicare PPO | Admitting: *Deleted

## 2022-07-10 DIAGNOSIS — J449 Chronic obstructive pulmonary disease, unspecified: Secondary | ICD-10-CM | POA: Diagnosis not present

## 2022-07-10 NOTE — Progress Notes (Signed)
Daily Session Note  Patient Details  Name: Bryan Mcclain MRN: [id number] Date of Birth: 08-22-1945 Referring Provider:   April Manson Pulmonary Rehab from 05/15/2022 in [facility] Cardiac and Pulmonary Rehab  Referring Provider Emily Filbert MD       Encounter Date: 07/10/2022  Check In:  Session Check In - 07/10/22 0800       Check-In   Supervising physician immediately available to respond to emergencies See telemetry face sheet for immediately available ER MD    Location [facility]-Cardiac & Pulmonary Rehab    Staff Present Darlyne Russian, RN, ADN;Jessica Luan Pulling, MA, RCEP, CCRP, CCET;Joseph [place], [place]    Virtual Visit No    Medication changes reported     No    Fall or balance concerns reported    No    Warm-up and Cool-down Performed on first and last piece of equipment    Resistance Training Performed Yes    VAD Patient? No    PAD/SET Patient? No      Pain Assessment   Currently in Pain? No/denies                Social History   Tobacco Use  Smoking Status Former   Packs/day: 1.00   Years: 42.00   Total pack years: 42.00   Types: Cigarettes   Quit date: 11/01/2021   Years since quitting: 0.6  Smokeless Tobacco Never    Goals Met:  Independence with exercise equipment Exercise tolerated well No report of concerns or symptoms today Strength training completed today  Goals Unmet:  Not Applicable  Comments: Pt able to follow exercise prescription today without complaint.  Will continue to monitor for progression.    Dr. Emily Filbert is Medical Director for [place].  Dr. Ottie Glazier is Medical Director for [facility] Pulmonary Rehabilitation.

## 2022-07-12 ENCOUNTER — Encounter: Payer: Medicare PPO | Admitting: *Deleted

## 2022-07-12 DIAGNOSIS — J449 Chronic obstructive pulmonary disease, unspecified: Secondary | ICD-10-CM

## 2022-07-12 NOTE — Progress Notes (Signed)
Daily Session Note  Patient Details  Name: Bryan Mcclain MRN: [id number] Date of Birth: [birth year] Referring Provider:   April Manson Pulmonary Rehab from 05/15/2022 in [facility] Cardiac and Pulmonary Rehab  Referring Provider Emily Filbert MD       Encounter Date: 07/12/2022  Check In:  Session Check In - 07/12/22 0758       Check-In   Supervising physician immediately available to respond to emergencies See telemetry face sheet for immediately available ER MD    Location [facility]-Cardiac & Pulmonary Rehab    Staff Present Darlyne Russian, RN, ADN;Jessica Luan Pulling, MA, RCEP, CCRP, CCET;Joseph [place], RCP,RRT,BSRT;Noah [place], [place], Exercise Physiologist    Virtual Visit No    Medication changes reported     No    Fall or balance concerns reported    No    Warm-up and Cool-down Performed on first and last piece of equipment    Resistance Training Performed Yes    VAD Patient? No    PAD/SET Patient? No      Pain Assessment   Currently in Pain? No/denies                Social History   Tobacco Use  Smoking Status Former   Packs/day: 1.00   Years: 42.00   Total pack years: 42.00   Types: Cigarettes   Quit date: 11/01/2021   Years since quitting: 0.6  Smokeless Tobacco Never    Goals Met:  Independence with exercise equipment Exercise tolerated well No report of concerns or symptoms today Strength training completed today  Goals Unmet:  Not Applicable  Comments: Pt able to follow exercise prescription today without complaint.  Will continue to monitor for progression.    Dr. Emily Filbert is Medical Director for [place].  Dr. Ottie Glazier is Medical Director for [facility] Pulmonary Rehabilitation.

## 2022-07-14 ENCOUNTER — Encounter: Payer: Medicare PPO | Admitting: *Deleted

## 2022-07-14 DIAGNOSIS — J449 Chronic obstructive pulmonary disease, unspecified: Secondary | ICD-10-CM | POA: Diagnosis not present

## 2022-07-14 NOTE — Progress Notes (Signed)
Daily Session Note  Patient Details  Name: Bryan Mcclain MRN: [id number] Date of Birth: Jun 12, 1945 Referring Provider:   April Manson Pulmonary Rehab from 05/15/2022 in [facility] Cardiac and Pulmonary Rehab  Referring Provider Emily Filbert MD       Encounter Date: 07/14/2022  Check In:  Session Check In - 07/14/22 0745       Check-In   Supervising physician immediately available to respond to emergencies See telemetry face sheet for immediately available ER MD    Location [facility]-Cardiac & Pulmonary Rehab    Staff Present Darlyne Russian, RN, ADN;Jessica Luan Pulling, MA, RCEP, CCRP, CCET;Joseph [place], [place]    Virtual Visit No    Medication changes reported     No    Fall or balance concerns reported    No    Warm-up and Cool-down Performed on first and last piece of equipment    Resistance Training Performed Yes    VAD Patient? No    PAD/SET Patient? No      Pain Assessment   Currently in Pain? No/denies                Social History   Tobacco Use  Smoking Status Former   Packs/day: 1.00   Years: 42.00   Total pack years: 42.00   Types: Cigarettes   Quit date: 11/01/2021   Years since quitting: 0.6  Smokeless Tobacco Never    Goals Met:  Independence with exercise equipment Exercise tolerated well No report of concerns or symptoms today Strength training completed today  Goals Unmet:  Not Applicable  Comments: Pt able to follow exercise prescription today without complaint.  Will continue to monitor for progression.    Dr. Emily Filbert is Medical Director for [place].  Dr. Ottie Glazier is Medical Director for [facility] Pulmonary Rehabilitation.

## 2022-07-17 ENCOUNTER — Encounter: Payer: Medicare PPO | Admitting: *Deleted

## 2022-07-17 DIAGNOSIS — J449 Chronic obstructive pulmonary disease, unspecified: Secondary | ICD-10-CM

## 2022-07-17 NOTE — Progress Notes (Signed)
Daily Session Note  Patient Details  Name: Bryan Mcclain MRN: [id number] Date of Birth: 1945/07/20 Referring Provider:   April Manson Pulmonary Rehab from 05/15/2022 in [facility] Cardiac and Pulmonary Rehab  Referring Provider Emily Filbert MD       Encounter Date: 07/17/2022  Check In:  Session Check In - 07/17/22 0803       Check-In   Supervising physician immediately available to respond to emergencies See telemetry face sheet for immediately available ER MD    Location [facility]-Cardiac & Pulmonary Rehab    Staff Present Darlyne Russian, RN, Doyce Para, BS, ACSM CEP, Exercise Physiologist;Jessica Luan Pulling, MA, RCEP, CCRP, CCET;Joseph [place], [place]    Virtual Visit No    Medication changes reported     No    Fall or balance concerns reported    No    Warm-up and Cool-down Performed on first and last piece of equipment    Resistance Training Performed Yes    VAD Patient? No    PAD/SET Patient? No      Pain Assessment   Currently in Pain? No/denies                Social History   Tobacco Use  Smoking Status Former   Packs/day: 1.00   Years: 42.00   Total pack years: 42.00   Types: Cigarettes   Quit date: 11/01/2021   Years since quitting: 0.7  Smokeless Tobacco Never    Goals Met:  Independence with exercise equipment Exercise tolerated well No report of concerns or symptoms today Strength training completed today  Goals Unmet:  Not Applicable  Comments: Pt able to follow exercise prescription today without complaint.  Will continue to monitor for progression.    Dr. Emily Filbert is Medical Director for [place].  Dr. Ottie Glazier is Medical Director for [facility] Pulmonary Rehabilitation.

## 2022-07-19 ENCOUNTER — Encounter: Payer: Self-pay | Admitting: *Deleted

## 2022-07-19 ENCOUNTER — Encounter: Payer: Medicare PPO | Admitting: *Deleted

## 2022-07-19 DIAGNOSIS — J449 Chronic obstructive pulmonary disease, unspecified: Secondary | ICD-10-CM

## 2022-07-19 NOTE — Progress Notes (Signed)
Daily Session Note  Patient Details  Name: Bryan Mcclain MRN: [id number] Date of Birth: [birth year] Referring Provider:   April Manson Pulmonary Rehab from 05/15/2022 in [facility] Cardiac and Pulmonary Rehab  Referring Provider Emily Filbert MD       Encounter Date: 07/19/2022  Check In:  Session Check In - 07/19/22 0747       Check-In   Supervising physician immediately available to respond to emergencies See telemetry face sheet for immediately available ER MD    Location [facility]-Cardiac & Pulmonary Rehab    Staff Present Darlyne Russian, RN, ADN;Joseph Tessie Fass, RCP,RRT,BSRT;Noah Tickle, BS, Exercise Physiologist    Virtual Visit No    Medication changes reported     No    Fall or balance concerns reported    No    Warm-up and Cool-down Performed on first and last piece of equipment    Resistance Training Performed Yes    VAD Patient? No    PAD/SET Patient? No      Pain Assessment   Currently in Pain? No/denies                Social History   Tobacco Use  Smoking Status Former   Packs/day: 1.00   Years: 42.00   Total pack years: 42.00   Types: Cigarettes   Quit date: 11/01/2021   Years since quitting: 0.7  Smokeless Tobacco Never    Goals Met:  Independence with exercise equipment Exercise tolerated well No report of concerns or symptoms today Strength training completed today  Goals Unmet:  Not Applicable  Comments: Pt able to follow exercise prescription today without complaint.  Will continue to monitor for progression.    Dr. Emily Filbert is Medical Director for [place].  Dr. Ottie Glazier is Medical Director for [facility] Pulmonary Rehabilitation.

## 2022-07-19 NOTE — Progress Notes (Signed)
Pulmonary Individual Treatment Plan  Patient Details  Name: Bryan Mcclain MRN: [id number] Date of Birth: 05-Dec-1945 Referring Provider:   April Manson Pulmonary Rehab from 05/15/2022 in [facility] Cardiac and Pulmonary Rehab  Referring Provider Emily Filbert MD       Initial Encounter Date:  Flowsheet Row Pulmonary Rehab from 05/15/2022 in [facility] Cardiac and Pulmonary Rehab  Date 05/15/22       Visit Diagnosis: Chronic obstructive pulmonary disease, unspecified COPD type ([place])  Patient's Home Medications on Admission:  Current Outpatient Medications:    albuterol (PROVENTIL) (2.5 MG/3ML) 0.083% nebulizer solution, Take 2.5 mg by nebulization 4 (four) times daily., Disp: , Rfl:    albuterol (VENTOLIN HFA) 108 (90 Base) MCG/ACT inhaler, Inhale 2 puffs into the lungs every 6 (six) hours as needed. (Patient not taking: Reported on 05/11/2022), Disp: , Rfl:    albuterol (VENTOLIN HFA) 108 (90 Base) MCG/ACT inhaler, Inhale into the lungs., Disp: , Rfl:    alfuzosin (UROXATRAL) 10 MG 24 hr tablet, Take 10 mg by mouth daily. (Patient not taking: Reported on 05/11/2022), Disp: , Rfl:    alfuzosin (UROXATRAL) 10 MG 24 hr tablet, Take 1 tablet by mouth daily., Disp: , Rfl:    aspirin 81 MG chewable tablet, Chew 81 mg by mouth daily. (Patient not taking: Reported on 05/11/2022), Disp: , Rfl:    benzonatate (TESSALON) 200 MG capsule, Take 200 mg by mouth 3 (three) times daily as needed., Disp: , Rfl:    cetirizine (ZYRTEC) 10 MG tablet, Take 10 mg by mouth daily., Disp: , Rfl:    cetirizine (ZYRTEC) 10 MG tablet, Take by mouth. (Patient not taking: Reported on 05/11/2022), Disp: , Rfl:    diltiazem (CARDIZEM CD) 180 MG 24 hr capsule, Take by mouth., Disp: , Rfl:    dutasteride (AVODART) 0.5 MG capsule, Take 0.5 mg by mouth daily., Disp: , Rfl:    fluticasone (FLONASE) 50 MCG/ACT nasal spray, Place into the nose., Disp: , Rfl:    hydrochlorothiazide (HYDRODIURIL) 12.5 MG tablet, Take 12.5 mg by mouth  daily., Disp: , Rfl:    ipratropium-albuterol (DUONEB) 0.5-2.5 (3) MG/3ML SOLN, Take 3 mLs by nebulization 2 (two) times daily. (Patient not taking: Reported on 05/11/2022), Disp: , Rfl:    ipratropium-albuterol (DUONEB) 0.5-2.5 (3) MG/3ML SOLN, Inhale into the lungs., Disp: , Rfl:    loratadine (CLARITIN) 10 MG tablet, Take 1 tablet (10 mg total) by mouth daily. (Patient not taking: Reported on 05/11/2022), Disp: 30 tablet, Rfl: 1   loratadine (CLARITIN) 10 MG tablet, Take 1 tablet by mouth daily as needed., Disp: , Rfl:    LUMIGAN 0.01 % SOLN, Place 1 drop into both eyes every evening., Disp: , Rfl:    Melatonin 10 MG CAPS, Take by mouth., Disp: , Rfl:    naproxen sodium (ALEVE) 220 MG tablet, Take by mouth., Disp: , Rfl:    nystatin (MYCOSTATIN) 100000 UNIT/ML suspension, Take 5 mLs by mouth 4 (four) times daily., Disp: , Rfl:    olmesartan (BENICAR) 20 MG tablet, Take 20 mg by mouth daily., Disp: , Rfl:    predniSONE (DELTASONE) 5 MG tablet, Advised to take prednisone 50 mg daily and wean by 5 mg every day. (Patient not taking: Reported on 05/11/2022), Disp: 55 tablet, Rfl: 0   simvastatin (ZOCOR) 40 MG tablet, Take 40 mg by mouth daily. (Patient not taking: Reported on 05/11/2022), Disp: , Rfl:    simvastatin (ZOCOR) 40 MG tablet, Take 1 tablet by mouth at bedtime., Disp: ,  Rfl:    TRELEGY ELLIPTA 100-62.5-25 MCG/ACT AEPB, Inhale 1 puff into the lungs daily. (Patient not taking: Reported on 05/11/2022), Disp: , Rfl:    TRELEGY ELLIPTA 200-62.5-25 MCG/ACT AEPB, Inhale 1 puff into the lungs daily., Disp: , Rfl:    verapamil (VERELAN PM) 240 MG 24 hr capsule, Take 240 mg by mouth daily. (Patient not taking: Reported on 05/11/2022), Disp: , Rfl:   Past Medical History: Past Medical History:  Diagnosis Date   COPD (chronic obstructive pulmonary disease) ([place])    Hyperlipidemia    Hypertension     Tobacco Use: Social History   Tobacco Use  Smoking Status Former   Packs/day: 1.00    Years: 42.00   Total pack years: 42.00   Types: Cigarettes   Quit date: 11/01/2021   Years since quitting: 0.7  Smokeless Tobacco Never    Labs: Review Flowsheet       Latest Ref Rng & Units 11/08/2021  Labs for ITP Cardiac and Pulmonary Rehab  Hemoglobin A1c 4.8 - 5.6 % 5.8      Pulmonary Assessment Scores:  Pulmonary Assessment Scores     Row Name 05/15/22 1445         ADL UCSD   ADL Phase Entry     SOB Score total 58     Rest 1     Walk 1     Stairs 3     Bath 3     Dress 2     Shop 3       CAT Score   CAT Score 22       mMRC Score   mMRC Score 1              UCSD: Self-administered rating of dyspnea associated with activities of daily living (ADLs) 6-point scale (0 = "not at all" to 5 = "maximal or unable to do because of breathlessness")  Scoring Scores range from 0 to 120.  Minimally important difference is 5 units  CAT: CAT can identify the health impairment of COPD patients and is better correlated with disease progression.  CAT has a scoring range of zero to 40. The CAT score is classified into four groups of low (less than 10), medium (10 - 20), high (21-30) and very high (31-40) based on the impact level of disease on health status. A CAT score over 10 suggests significant symptoms.  A worsening CAT score could be explained by an exacerbation, poor medication adherence, poor inhaler technique, or progression of COPD or comorbid conditions.  CAT MCID is 2 points  mMRC: mMRC (Modified Medical Research Council) Dyspnea Scale is used to assess the degree of baseline functional disability in patients of respiratory disease due to dyspnea. No minimal important difference is established. A decrease in score of 1 point or greater is considered a positive change.   Pulmonary Function Assessment:  Pulmonary Function Assessment - 05/11/22 1011       Breath   Shortness of Breath Yes;Limiting activity             Exercise Target Goals: Exercise  Program Goal: Individual exercise prescription set using results from initial 6 min walk test and THRR while considering  patient's activity barriers and safety.   Exercise Prescription Goal: Initial exercise prescription builds to 30-45 minutes a day of aerobic activity, 2-3 days per week.  Home exercise guidelines will be given to patient during program as part of exercise prescription that the participant will acknowledge.  Education:  Aerobic Exercise: - Group verbal and visual presentation on the components of exercise prescription. Introduces F.I.T.T principle from ACSM for exercise prescriptions.  Reviews F.I.T.T. principles of aerobic exercise including progression. Written material given at graduation. Flowsheet Row Pulmonary Rehab from 07/19/2022 in [facility] Cardiac and Pulmonary Rehab  Date 06/14/22  Educator [facility]  Instruction Review Code 1- Verbalizes Understanding       Education: Resistance Exercise: - Group verbal and visual presentation on the components of exercise prescription. Introduces F.I.T.T principle from ACSM for exercise prescriptions  Reviews F.I.T.T. principles of resistance exercise including progression. Written material given at graduation. Flowsheet Row Pulmonary Rehab from 07/19/2022 in [facility] Cardiac and Pulmonary Rehab  Date 06/21/22  Educator [facility]  Instruction Review Code 1- Verbalizes Understanding        Education: Exercise & Equipment Safety: - Individual verbal instruction and demonstration of equipment use and safety with use of the equipment. Flowsheet Row Pulmonary Rehab from 07/19/2022 in [facility] Cardiac and Pulmonary Rehab  Date 05/11/22  Educator [facility]  Instruction Review Code 1- Verbalizes Understanding       Education: Exercise Physiology & General Exercise Guidelines: - Group verbal and written instruction with models to review the exercise physiology of the cardiovascular system and associated critical values. Provides general exercise guidelines  with specific guidelines to those with heart or lung disease.    Education: Flexibility, Balance, Mind/Body Relaxation: - Group verbal and visual presentation with interactive activity on the components of exercise prescription. Introduces F.I.T.T principle from ACSM for exercise prescriptions. Reviews F.I.T.T. principles of flexibility and balance exercise training including progression. Also discusses the mind body connection.  Reviews various relaxation techniques to help reduce and manage stress (i.e. Deep breathing, progressive muscle relaxation, and visualization). Balance handout provided to take home. Written material given at graduation. Flowsheet Row Pulmonary Rehab from 07/19/2022 in [facility] Cardiac and Pulmonary Rehab  Date 06/28/22  Educator [facility]  Instruction Review Code 1- Verbalizes Understanding       Activity Barriers & Risk Stratification:  Activity Barriers & Cardiac Risk Stratification - 05/15/22 1435       Activity Barriers & Cardiac Risk Stratification   Activity Barriers Shortness of Breath;Deconditioning;Muscular Weakness             6 Minute Walk:  6 Minute Walk     Row Name 05/15/22 1431         6 Minute Walk   Phase Initial     Distance 1200 feet     Walk Time 6 minutes     # of Rest Breaks 0     MPH 2.27     METS 2.89     RPE 14     Perceived Dyspnea  2     VO2 Peak 10.13     Symptoms Yes (comment)     Comments SOB     Resting HR 89 bpm     Resting BP 128/64     Resting Oxygen Saturation  91 %     Exercise Oxygen Saturation  during 6 min walk 88 %     Max Ex. HR 104 bpm     Max Ex. BP 162/74     2 Minute Post BP 146/74       Interval HR   1 Minute HR 97     2 Minute HR 99     3 Minute HR 100     4 Minute HR 102     5 Minute HR 102  6 Minute HR 104     2 Minute Post HR 96     Interval Heart Rate? Yes       Interval Oxygen   Interval Oxygen? Yes     Baseline Oxygen Saturation % 91 %     1 Minute Oxygen Saturation % 88 %     1  Minute Liters of Oxygen 0 L  Room Air     2 Minute Oxygen Saturation % 90 %     2 Minute Liters of Oxygen 0 L     3 Minute Oxygen Saturation % 89 %     3 Minute Liters of Oxygen 0 L     4 Minute Oxygen Saturation % 90 %     4 Minute Liters of Oxygen 0 L     5 Minute Oxygen Saturation % 90 %     5 Minute Liters of Oxygen 0 L     6 Minute Oxygen Saturation % 90 %     6 Minute Liters of Oxygen 0 L     2 Minute Post Oxygen Saturation % 92 %     2 Minute Post Liters of Oxygen 0 L             Oxygen Initial Assessment:  Oxygen Initial Assessment - 05/11/22 1010       Home Oxygen   Home Oxygen Device Home Concentrator;E-Tanks;Portable Concentrator    Sleep Oxygen Prescription Continuous   Patients states that he could not tolerate CPAP   Liters per minute 2    Home Exercise Oxygen Prescription None    Home Resting Oxygen Prescription None    Compliance with Home Oxygen Use Yes      Initial 6 min Walk   Oxygen Used None      Program Oxygen Prescription   Program Oxygen Prescription None      Intervention   Short Term Goals To learn and understand importance of monitoring SPO2 with pulse oximeter and demonstrate accurate use of the pulse oximeter.;To learn and demonstrate proper pursed lip breathing techniques or other breathing techniques. ;To learn and exhibit compliance with exercise, home and travel O2 prescription;To learn and understand importance of maintaining oxygen saturations>88%;To learn and demonstrate proper use of respiratory medications    Long  Term Goals Exhibits compliance with exercise, home  and travel O2 prescription;Maintenance of O2 saturations>88%;Compliance with respiratory medication;Demonstrates proper use of MDI's;Verbalizes importance of monitoring SPO2 with pulse oximeter and return demonstration;Exhibits proper breathing techniques, such as pursed lip breathing or other method taught during program session             Oxygen Re-Evaluation:   Oxygen Re-Evaluation     Row Name 06/07/22 0759 06/28/22 1149           Program Oxygen Prescription   Program Oxygen Prescription None None        Home Oxygen   Home Oxygen Device Home Concentrator;E-Tanks;Portable Concentrator Home Concentrator;E-Tanks;Portable Concentrator      Sleep Oxygen Prescription Continuous Continuous      Liters per minute 2 2      Home Exercise Oxygen Prescription None None      Home Resting Oxygen Prescription None None      Compliance with Home Oxygen Use Yes Yes        Goals/Expected Outcomes   Short Term Goals To learn and understand importance of maintaining oxygen saturations>88%;To learn and understand importance of monitoring SPO2 with pulse oximeter and demonstrate accurate use of  the pulse oximeter. To learn and demonstrate proper pursed lip breathing techniques or other breathing techniques.       Long  Term Goals Maintenance of O2 saturations>88%;Verbalizes importance of monitoring SPO2 with pulse oximeter and return demonstration Exhibits proper breathing techniques, such as pursed lip breathing or other method taught during program session      Comments He does have a pulse oximeter to check his oxygen saturation at home. Informed him and explained why it is important to have one. Reviewed that oxygen saturations should be 88 percent and above. Patient verbalizes understanding. Diaphragmatic and PLB breathing explained and performed with patient. Patient has a better understanding of how to do these exercises to help with breathing performance and relaxation. Patient performed breathing techniques adequately and to practice further at home.      Goals/Expected Outcomes Short: monitor oxygen at home with exertion. Long: maintain oxygen saturations above 88 percent independently. Short: practice PLB and diaphragmatic breathing at home. Long: Use PLB and diaphragmatic breathing independently post [employer].               Oxygen Discharge (Final  Oxygen Re-Evaluation):  Oxygen Re-Evaluation - 06/28/22 1149       Program Oxygen Prescription   Program Oxygen Prescription None      Home Oxygen   Home Oxygen Device Home Concentrator;E-Tanks;Portable Concentrator    Sleep Oxygen Prescription Continuous    Liters per minute 2    Home Exercise Oxygen Prescription None    Home Resting Oxygen Prescription None    Compliance with Home Oxygen Use Yes      Goals/Expected Outcomes   Short Term Goals To learn and demonstrate proper pursed lip breathing techniques or other breathing techniques.     Long  Term Goals Exhibits proper breathing techniques, such as pursed lip breathing or other method taught during program session    Comments Diaphragmatic and PLB breathing explained and performed with patient. Patient has a better understanding of how to do these exercises to help with breathing performance and relaxation. Patient performed breathing techniques adequately and to practice further at home.    Goals/Expected Outcomes Short: practice PLB and diaphragmatic breathing at home. Long: Use PLB and diaphragmatic breathing independently post [employer].             Initial Exercise Prescription:  Initial Exercise Prescription - 05/15/22 1400       Date of Initial Exercise RX and Referring Provider   Date 05/15/22    Referring Provider Emily Filbert MD      Oxygen   Maintain Oxygen Saturation 88% or higher      Treadmill   MPH 2    Grade 0.5    Minutes 15    METs 2.67      Recumbant Bike   Level 2    RPM 50    Watts 22    Minutes 15    METs 2      Elliptical   Level 1    Speed 2.5    Minutes 15    METs 2      T5 Nustep   Level 2    SPM 80    Minutes 15    METs 2      Biostep-RELP   Level 2    SPM 50    Minutes 15    METs 2      Track   Laps 31    Minutes 15    METs 2.69  Prescription Details   Frequency (times per week) 3    Duration Progress to 30 minutes of continuous aerobic without  signs/symptoms of physical distress      Intensity   THRR 40-80% of Max Heartrate 110-132    Ratings of Perceived Exertion 11-13    Perceived Dyspnea 0-4      Progression   Progression Continue to progress workloads to maintain intensity without signs/symptoms of physical distress.      Resistance Training   Training Prescription Yes    Weight 3 lb    Reps 10-15             Perform Capillary Blood Glucose checks as needed.  Exercise Prescription Changes:   Exercise Prescription Changes     Row Name 05/15/22 1400 05/31/22 1600 06/12/22 1500 06/27/22 1400 07/11/22 1500     Response to Exercise   Blood Pressure (Admit) 128/64 124/62 110/78 126/74 122/60   Blood Pressure (Exercise) 162/74 178/74 122/60 178/70 --   Blood Pressure (Exit) 126/62 128/62 104/60 110/60 124/60   Heart Rate (Admit) 89 bpm 88 bpm 72 bpm 86 bpm 92 bpm   Heart Rate (Exercise) 104 bpm 113 bpm 104 bpm 108 bpm 112 bpm   Heart Rate (Exit) 92 bpm 98 bpm 95 bpm 88 bpm 96 bpm   Oxygen Saturation (Admit) 91 % 91 % 92 % 90 % 92 %   Oxygen Saturation (Exercise) 88 % 85 % 88 % 88 % 90 %   Oxygen Saturation (Exit) 94 % 92 % 91 % 93 % 94 %   Rating of Perceived Exertion (Exercise) 14 16 13 13 14   $ Perceived Dyspnea (Exercise) 2 2 1 1 2   $ Symptoms SOB SOB SOB SOB SOB   Comments walk test results First two weeks in rehab -- -- --   Duration -- Progress to 30 minutes of  aerobic without signs/symptoms of physical distress Continue with 30 min of aerobic exercise without signs/symptoms of physical distress. Continue with 30 min of aerobic exercise without signs/symptoms of physical distress. Continue with 30 min of aerobic exercise without signs/symptoms of physical distress.   Intensity -- THRR unchanged THRR unchanged THRR unchanged THRR unchanged     Progression   Progression -- Continue to progress workloads to maintain intensity without signs/symptoms of physical distress. Continue to progress workloads to  maintain intensity without signs/symptoms of physical distress. Continue to progress workloads to maintain intensity without signs/symptoms of physical distress. Continue to progress workloads to maintain intensity without signs/symptoms of physical distress.   Average METs -- 2.58 2.61 2.59 2.74     Resistance Training   Training Prescription -- Yes Yes Yes Yes   Weight -- 3 lb 3 lb 4 lb 4 lb   Reps -- 10-15 10-15 10-15 10-15     Interval Training   Interval Training -- No No No No     Treadmill   MPH -- 2 1.5 2 1.8   Grade -- 0.5 0 0 0   Minutes -- 15 15 15 15   $ METs -- 2.67 2.15 2.53 2.38     Recumbant Bike   Level -- 3 3 3 3   $ Watts -- 23 23 23 30   $ Minutes -- 15 15 15 15   $ METs -- 2.89 2.87 2.88 3.15     NuStep   Level -- 3 -- 3 --   Minutes -- 15 -- 15 --   METs -- 3 -- 2.7 --  REL-XR   Level -- -- 2 -- --   Minutes -- -- 15 -- --   METs -- -- 3.8 -- --     T5 Nustep   Level -- 3 3 3 3   $ Minutes -- 15 15 15 15   $ METs -- 2 2 2.1 2.1     Biostep-RELP   Level -- -- -- -- 3   Minutes -- -- -- -- 15   METs -- -- -- -- 3     Track   Laps -- 31 -- 30 30   Minutes -- 15 -- 15 15   METs -- 2.69 -- 2.63 2.63     Oxygen   Maintain Oxygen Saturation -- 88% or higher 88% or higher 88% or higher 88% or higher            Exercise Comments:   Exercise Goals and Review:   Exercise Goals     Row Name 05/15/22 1443             Exercise Goals   Increase Physical Activity Yes       Intervention Provide advice, education, support and counseling about physical activity/exercise needs.;Develop an individualized exercise prescription for aerobic and resistive training based on initial evaluation findings, risk stratification, comorbidities and participant's personal goals.       Expected Outcomes Short Term: Attend rehab on a regular basis to increase amount of physical activity.;Long Term: Add in home exercise to make exercise part of routine and to increase  amount of physical activity.;Long Term: Exercising regularly at least 3-5 days a week.       Increase Strength and Stamina Yes       Intervention Provide advice, education, support and counseling about physical activity/exercise needs.;Develop an individualized exercise prescription for aerobic and resistive training based on initial evaluation findings, risk stratification, comorbidities and participant's personal goals.       Expected Outcomes Short Term: Increase workloads from initial exercise prescription for resistance, speed, and METs.;Short Term: Perform resistance training exercises routinely during rehab and add in resistance training at home;Long Term: Improve cardiorespiratory fitness, muscular endurance and strength as measured by increased METs and functional capacity (6MWT)       Able to understand and use rate of perceived exertion (RPE) scale Yes       Intervention Provide education and explanation on how to use RPE scale       Expected Outcomes Short Term: Able to use RPE daily in rehab to express subjective intensity level;Long Term:  Able to use RPE to guide intensity level when exercising independently       Able to understand and use Dyspnea scale Yes       Intervention Provide education and explanation on how to use Dyspnea scale       Expected Outcomes Long Term: Able to use Dyspnea scale to guide intensity level when exercising independently;Short Term: Able to use Dyspnea scale daily in rehab to express subjective sense of shortness of breath during exertion       Knowledge and understanding of Target Heart Rate Range (THRR) Yes       Intervention Provide education and explanation of THRR including how the numbers were predicted and where they are located for reference       Expected Outcomes Short Term: Able to state/look up THRR;Short Term: Able to use daily as guideline for intensity in rehab;Long Term: Able to use THRR to govern intensity when exercising independently  Able to check pulse independently Yes       Intervention Review the importance of being able to check your own pulse for safety during independent exercise;Provide education and demonstration on how to check pulse in carotid and radial arteries.       Expected Outcomes Long Term: Able to check pulse independently and accurately;Short Term: Able to explain why pulse checking is important during independent exercise       Understanding of Exercise Prescription Yes       Intervention Provide education, explanation, and written materials on patient's individual exercise prescription       Expected Outcomes Short Term: Able to explain program exercise prescription;Long Term: Able to explain home exercise prescription to exercise independently                Exercise Goals Re-Evaluation :  Exercise Goals Re-Evaluation     Row Name 05/31/22 1626 06/12/22 1600 06/27/22 1446 07/11/22 1520       Exercise Goal Re-Evaluation   Exercise Goals Review Increase Physical Activity;Increase Strength and Stamina;Understanding of Exercise Prescription Increase Physical Activity;Increase Strength and Stamina;Understanding of Exercise Prescription Increase Physical Activity;Increase Strength and Stamina;Understanding of Exercise Prescription Increase Physical Activity;Increase Strength and Stamina;Understanding of Exercise Prescription    Comments Gardner is off to a good start in rehab. He had an overall average MET level of 2.58 METs during his first two weeks attending rehab. He also was able to follow his exercise prescription for the treadmill at a speed of 2 mph and an incline of 0.5%. He was able to increase his workloads to level 3 on both the T5 and the recumbent bike as well. We will continue to monitor his progress. Marya Amsler continues to do well in rehab. He tried out the XR and was able to do level 2. He has stayed consistant working at 23 watts on the recumbent bike and we hope to see that improve. We would  also recommend putting that incline back on the treadmill workload. RPEs are staying appropriate and oxygen saturations are staying above 88%. Will continue to monitor. Marya Amsler is doing well in rehab. He has stayed consistent with his workloads on the seated machines, but could benefit from beginning to increase. He also has been inconsistent with his speed on the treadmill, but has worked his way back up to 2 mph. He did increase from 3 lb to 4 lb hand weights for resistance training. We will continue to monitor his progress in the program, Marya Amsler continues to do well in rehab. He worked up to 30 watts on the recumbant bike which is the highest for him yet! He also increased to level 3 on the Biostep. He has been consistently walking 30 laps on the track and will encouraged patient to increase his laps on the track beyond that. Will continue to monitor.    Expected Outcomes Short: Continue to follow exercise prescription. Long: Continue to attend pulmonary rehab regularly. Short: Add in incline back to treadmill, increase watts on recumbent bike Long: Continue to increase overall MET level Short: Continue to increase workload on the treadmill, begin to increase workloads on seated machines. Long: Continue to improve strength and stamina. Short: Increase laps on track Long: Continue to increase overall MET level and stamina             Discharge Exercise Prescription (Final Exercise Prescription Changes):  Exercise Prescription Changes - 07/11/22 1500       Response to Exercise   Blood Pressure (  Admit) 122/60    Blood Pressure (Exit) 124/60    Heart Rate (Admit) 92 bpm    Heart Rate (Exercise) 112 bpm    Heart Rate (Exit) 96 bpm    Oxygen Saturation (Admit) 92 %    Oxygen Saturation (Exercise) 90 %    Oxygen Saturation (Exit) 94 %    Rating of Perceived Exertion (Exercise) 14    Perceived Dyspnea (Exercise) 2    Symptoms SOB    Duration Continue with 30 min of aerobic exercise without  signs/symptoms of physical distress.    Intensity THRR unchanged      Progression   Progression Continue to progress workloads to maintain intensity without signs/symptoms of physical distress.    Average METs 2.74      Resistance Training   Training Prescription Yes    Weight 4 lb    Reps 10-15      Interval Training   Interval Training No      Treadmill   MPH 1.8    Grade 0    Minutes 15    METs 2.38      Recumbant Bike   Level 3    Watts 30    Minutes 15    METs 3.15      T5 Nustep   Level 3    Minutes 15    METs 2.1      Biostep-RELP   Level 3    Minutes 15    METs 3      Track   Laps 30    Minutes 15    METs 2.63      Oxygen   Maintain Oxygen Saturation 88% or higher             Nutrition:  Target Goals: Understanding of nutrition guidelines, daily intake of sodium <1524m, cholesterol <2010m calories 30% from fat and 7% or less from saturated fats, daily to have 5 or more servings of fruits and vegetables.  Education: All About Nutrition: -Group instruction provided by verbal, written material, interactive activities, discussions, models, and posters to present general guidelines for heart healthy nutrition including fat, fiber, MyPlate, the role of sodium in heart healthy nutrition, utilization of the nutrition label, and utilization of this knowledge for meal planning. Follow up email sent as well. Written material given at graduation.   Biometrics:  Pre Biometrics - 05/15/22 1444       Pre Biometrics   Height 5' 11.4" (1.814 m)    Weight 176 lb 14.4 oz (80.2 kg)    Waist Circumference 36.25 inches    Hip Circumference 37.5 inches    Waist to Hip Ratio 0.97 %    BMI (Calculated) 24.38    Single Leg Stand 21.5 seconds              Nutrition Therapy Plan and Nutrition Goals:  Nutrition Therapy & Goals - 05/15/22 0920       Nutrition Therapy   RD appointment deferred Yes   Pt would not like to meet with RD at this time, will  continue to follow up.     Personal Nutrition Goals   Nutrition Goal Pt would not like to meet with RD at this time, will continue to follow up.      Intervention Plan   Intervention Prescribe, educate and counsel regarding individualized specific dietary modifications aiming towards targeted core components such as weight, hypertension, lipid management, diabetes, heart failure and other comorbidities.;Nutrition handout(s) given to patient.  Expected Outcomes Short Term Goal: Understand basic principles of dietary content, such as calories, fat, sodium, cholesterol and nutrients.;Short Term Goal: A plan has been developed with personal nutrition goals set during dietitian appointment.;Long Term Goal: Adherence to prescribed nutrition plan.             Nutrition Assessments:  MEDIFICTS Score Key: ?70 Need to make dietary changes  40-70 Heart Healthy Diet ? 40 Therapeutic Level Cholesterol Diet  Flowsheet Row Pulmonary Rehab from 05/15/2022 in [facility] Cardiac and Pulmonary Rehab  Picture Your Plate Total Score on Admission 69      Picture Your Plate Scores: [id number] Unhealthy dietary pattern with much room for improvement. 41-50 Dietary pattern unlikely to meet recommendations for good health and room for improvement. 51-60 More healthful dietary pattern, with some room for improvement.  >60 Healthy dietary pattern, although there may be some specific behaviors that could be improved.   Nutrition Goals Re-Evaluation:  Nutrition Goals Re-Evaluation     [place] Name 06/07/22 0801 07/07/22 0802           Goals   Current Weight 181 lb (82.1 kg) 181 lb (82.1 kg)      Nutrition Goal -- Patient would not like to meet with RD at this time, will continue to follow up.      Comment Patient was informed on why it is important to maintain a balanced diet when dealing with Respiratory issues. Explained that it takes a lot of energy to breath and when they are short of breath often they will need  to have a good diet to help keep up with the calories they are expending for breathing. Marya Amsler has no questions about his eating habits and is doing well.      Expected Outcome Short: Choose and plan snacks accordingly to patients caloric intake to improve breathing. Long: Maintain a diet independently that meets their caloric intake to aid in daily shortness of breath. Short: continue to maintain diet. Long: eat a healthy diet that pertains to him.               Nutrition Goals Discharge (Final Nutrition Goals Re-Evaluation):  Nutrition Goals Re-Evaluation - 07/07/22 0802       Goals   Current Weight 181 lb (82.1 kg)    Nutrition Goal Patient would not like to meet with RD at this time, will continue to follow up.    Comment Marya Amsler has no questions about his eating habits and is doing well.    Expected Outcome Short: continue to maintain diet. Long: eat a healthy diet that pertains to him.             Psychosocial: Target Goals: Acknowledge presence or absence of significant depression and/or stress, maximize coping skills, provide positive support system. Participant is able to verbalize types and ability to use techniques and skills needed for reducing stress and depression.   Education: Stress, Anxiety, and Depression - Group verbal and visual presentation to define topics covered.  Reviews how body is impacted by stress, anxiety, and depression.  Also discusses healthy ways to reduce stress and to treat/manage anxiety and depression.  Written material given at graduation. Flowsheet Row Pulmonary Rehab from 07/19/2022 in [facility] Cardiac and Pulmonary Rehab  Date 05/31/22  Educator [facility]  Instruction Review Code 1- [employer] Understanding       Education: Sleep Hygiene -Provides group verbal and written instruction about how sleep can affect your health.  Define sleep hygiene, discuss sleep cycles and  impact of sleep habits. Review good sleep hygiene tips.    Initial Review &  Psychosocial Screening:  Initial Psych Review & Screening - 05/11/22 1013       Initial Review   Current issues with None Identified      Family Dynamics   Good Support System? Yes    Comments Marya Amsler can look to his wife and 4 boys for support. He states no stressors at the moment and wants to get his breathing better.      Barriers   Psychosocial barriers to participate in program There are no identifiable barriers or psychosocial needs.;The patient should benefit from training in stress management and relaxation.      Screening Interventions   Interventions Encouraged to exercise;To provide support and resources with identified psychosocial needs;Provide feedback about the scores to participant    Expected Outcomes Short Term goal: Utilizing psychosocial counselor, staff and physician to assist with identification of specific Stressors or current issues interfering with healing process. Setting desired goal for each stressor or current issue identified.;Long Term Goal: Stressors or current issues are controlled or eliminated.;Short Term goal: Identification and review with participant of any Quality of Life or Depression concerns found by scoring the questionnaire.;Long Term goal: The participant improves quality of Life and PHQ9 Scores as seen by post scores and/or verbalization of changes             Quality of Life Scores:  Scores of 19 and below usually indicate a poorer quality of life in these areas.  A difference of  2-3 points is a clinically meaningful difference.  A difference of 2-3 points in the total score of the Quality of Life Index has been associated with significant improvement in overall quality of life, self-image, physical symptoms, and general health in studies assessing change in quality of life.  PHQ-9: Review Flowsheet       06/07/2022 05/15/2022  Depression screen PHQ 2/9  Decreased Interest 0 0  Down, Depressed, Hopeless 0 0  PHQ - 2 Score 0 0  Altered  sleeping 1 0  Tired, decreased energy 3 2  Change in appetite 0 1  Feeling bad or failure about yourself  0 1  Trouble concentrating 0 1  Moving slowly or fidgety/restless 0 0  Suicidal thoughts 0 0  PHQ-9 Score 4 5  Difficult doing work/chores Not difficult at all Somewhat difficult   Interpretation of Total Score  Total Score Depression Severity:  1-4 = Minimal depression, 5-9 = Mild depression, 10-14 = Moderate depression, 15-19 = Moderately severe depression, 20-27 = Severe depression   Psychosocial Evaluation and Intervention:  Psychosocial Evaluation - 05/11/22 1014       Psychosocial Evaluation & Interventions   Interventions Relaxation education;Encouraged to exercise with the program and follow exercise prescription;Stress management education    Comments Marya Amsler can look to his wife and 4 boys for support. He states no stressors at the moment and wants to get his breathing better.    Expected Outcomes Short: Start [facility] to help with mood. Long: Maintain a healthy mental state.    Continue Psychosocial Services  Follow up required by staff             Psychosocial Re-Evaluation:  Psychosocial Re-Evaluation     [place] Name 06/07/22 0805 07/07/22 0803           Psychosocial Re-Evaluation   Current issues with None Identified None Identified      Comments Reviewed patient health questionnaire (PHQ-9)  with patient for follow up. Previously, patients score indicated signs/symptoms of depression.  Reviewed to see if patient is improving symptom wise while in program.  Score improved and patient states that it is because he has been more routine with working out. Patient reports no issues with their current mental states, sleep, stress, depression or anxiety. Will follow up with patient in a few weeks for any changes.      Expected Outcomes Short: Continue to attend [facility] regularly for regular exercise and social engagement. Long: Continue to improve symptoms and manage a  positive mental state. Short: Continue to exercise regularly to support mental health and notify staff of any changes. Long: maintain mental health and well being through teaching of rehab or prescribed medications independently.      Interventions Encouraged to attend Pulmonary Rehabilitation for the exercise Encouraged to attend Pulmonary Rehabilitation for the exercise      Continue Psychosocial Services  No Follow up required Follow up required by staff               Psychosocial Discharge (Final Psychosocial Re-Evaluation):  Psychosocial Re-Evaluation - 07/07/22 0803       Psychosocial Re-Evaluation   Current issues with None Identified    Comments Patient reports no issues with their current mental states, sleep, stress, depression or anxiety. Will follow up with patient in a few weeks for any changes.    Expected Outcomes Short: Continue to exercise regularly to support mental health and notify staff of any changes. Long: maintain mental health and well being through teaching of rehab or prescribed medications independently.    Interventions Encouraged to attend Pulmonary Rehabilitation for the exercise    Continue Psychosocial Services  Follow up required by staff             Education: Education Goals: Education classes will be provided on a weekly basis, covering required topics. Participant will state understanding/return demonstration of topics presented.  Learning Barriers/Preferences:  Learning Barriers/Preferences - 05/11/22 1012       Learning Barriers/Preferences   Learning Barriers None    Learning Preferences None             General Pulmonary Education Topics:  Infection Prevention: - Provides verbal and written material to individual with discussion of infection control including proper hand washing and proper equipment cleaning during exercise session. Flowsheet Row Pulmonary Rehab from 07/19/2022 in [facility] Cardiac and Pulmonary Rehab  Date 05/11/22   Educator [facility]  Instruction Review Code 1- Verbalizes Understanding       Falls Prevention: - Provides verbal and written material to individual with discussion of falls prevention and safety. Flowsheet Row Pulmonary Rehab from 07/19/2022 in [facility] Cardiac and Pulmonary Rehab  Date 05/11/22  Educator [facility]  Instruction Review Code 1- Verbalizes Understanding       Chronic Lung Disease Review: - Group verbal instruction with posters, models, PowerPoint presentations and videos,  to review new updates, new respiratory medications, new advancements in procedures and treatments. Providing information on websites and "800" numbers for continued self-education. Includes information about supplement oxygen, available portable oxygen systems, continuous and intermittent flow rates, oxygen safety, concentrators, and Medicare reimbursement for oxygen. Explanation of Pulmonary Drugs, including class, frequency, complications, importance of spacers, rinsing mouth after steroid MDI's, and proper cleaning methods for nebulizers. Review of basic lung anatomy and physiology related to function, structure, and complications of lung disease. Review of risk factors. Discussion about methods for diagnosing sleep apnea and types of masks and  machines for OSA. Includes a review of the use of types of environmental controls: home humidity, furnaces, filters, dust mite/pet prevention, HEPA vacuums. Discussion about weather changes, air quality and the benefits of nasal washing. Instruction on Warning signs, infection symptoms, calling MD promptly, preventive modes, and value of vaccinations. Review of effective airway clearance, coughing and/or vibration techniques. Emphasizing that all should Create an Action Plan. Written material given at graduation. Flowsheet Row Pulmonary Rehab from 07/19/2022 in [facility] Cardiac and Pulmonary Rehab  Education need identified 05/15/22  Date 05/24/22  Educator [facility]  Instruction Review Code 1-  Verbalizes Understanding       AED/CPR: - Group verbal and written instruction with the use of models to demonstrate the basic use of the AED with the basic ABC's of resuscitation.    Anatomy and Cardiac Procedures: - Group verbal and visual presentation and models provide information about basic cardiac anatomy and function. Reviews the testing methods done to diagnose heart disease and the outcomes of the test results. Describes the treatment choices: Medical Management, Angioplasty, or Coronary Bypass Surgery for treating various heart conditions including Myocardial Infarction, Angina, Valve Disease, and Cardiac Arrhythmias.  Written material given at graduation. Flowsheet Row Pulmonary Rehab from 07/19/2022 in [facility] Cardiac and Pulmonary Rehab  Date 07/05/22  Educator SB  Instruction Review Code 1- Verbalizes Understanding       Medication Safety: - Group verbal and visual instruction to review commonly prescribed medications for heart and lung disease. Reviews the medication, class of the drug, and side effects. Includes the steps to properly store meds and maintain the prescription regimen.  Written material given at graduation. Flowsheet Row Pulmonary Rehab from 07/19/2022 in [facility] Cardiac and Pulmonary Rehab  Date 07/19/22  Educator MS  Instruction Review Code 1- Verbalizes Understanding       Other: -Provides group and verbal instruction on various topics (see comments) Flowsheet Row Pulmonary Rehab from 07/19/2022 in [facility] Cardiac and Pulmonary Rehab  Date 07/12/22  Educator SB  Instruction Review Code 1- Verbalizes Understanding  [Jeopardy]       Knowledge Questionnaire Score:  Knowledge Questionnaire Score - 05/15/22 1444       Knowledge Questionnaire Score   Pre Score 15/18              Core Components/Risk Factors/Patient Goals at Admission:  Personal Goals and Risk Factors at Admission - 05/15/22 1444       Core Components/Risk Factors/Patient Goals  on Admission    Weight Management Yes;Weight Maintenance    Intervention Weight Management: Develop a combined nutrition and exercise program designed to reach desired caloric intake, while maintaining appropriate intake of nutrient and fiber, sodium and fats, and appropriate energy expenditure required for the weight goal.;Weight Management: Provide education and appropriate resources to help participant work on and attain dietary goals.;Weight Management/Obesity: Establish reasonable short term and long term weight goals.    Admit Weight 176 lb 14.4 oz (80.2 kg)    Goal Weight: Short Term 176 lb (79.8 kg)    Goal Weight: Long Term 175 lb (79.4 kg)    Expected Outcomes Short Term: Continue to assess and modify interventions until short term weight is achieved;Long Term: Adherence to nutrition and physical activity/exercise program aimed toward attainment of established weight goal;Weight Maintenance: Understanding of the daily nutrition guidelines, which includes 25-35% calories from fat, 7% or less cal from saturated fats, less than 260m cholesterol, less than 1.5gm of sodium, & 5 or more servings of fruits and vegetables  daily    Improve shortness of breath with ADL's Yes    Intervention Provide education, individualized exercise plan and daily activity instruction to help decrease symptoms of SOB with activities of daily living.    Expected Outcomes Short Term: Improve cardiorespiratory fitness to achieve a reduction of symptoms when performing ADLs;Long Term: Be able to perform more ADLs without symptoms or delay the onset of symptoms    Increase knowledge of respiratory medications and ability to use respiratory devices properly  Yes    Intervention Provide education and demonstration as needed of appropriate use of medications, inhalers, and oxygen therapy.    Expected Outcomes Short Term: Achieves understanding of medications use. Understands that oxygen is a medication prescribed by physician.  Demonstrates appropriate use of inhaler and oxygen therapy.;Long Term: Maintain appropriate use of medications, inhalers, and oxygen therapy.    Hypertension Yes    Intervention Provide education on lifestyle modifcations including regular physical activity/exercise, weight management, moderate sodium restriction and increased consumption of fresh fruit, vegetables, and low fat dairy, alcohol moderation, and smoking cessation.;Monitor prescription use compliance.    Expected Outcomes Short Term: Continued assessment and intervention until BP is < 140/81m HG in hypertensive participants. < 130/852mHG in hypertensive participants with diabetes, heart failure or chronic kidney disease.;Long Term: Maintenance of blood pressure at goal levels.    Lipids Yes    Intervention Provide education and support for participant on nutrition & aerobic/resistive exercise along with prescribed medications to achieve LDL <7091mHDL >25m61m  Expected Outcomes Short Term: Participant states understanding of desired cholesterol values and is compliant with medications prescribed. Participant is following exercise prescription and nutrition guidelines.;Long Term: Cholesterol controlled with medications as prescribed, with individualized exercise RX and with personalized nutrition plan. Value goals: LDL < 70mg50mL > 40 mg.             Education:Diabetes - Individual verbal and written instruction to review signs/symptoms of diabetes, desired ranges of glucose level fasting, after meals and with exercise. Acknowledge that pre and post exercise glucose checks will be done for 3 sessions at entry of program.   Know Your Numbers and Heart Failure: - Group verbal and visual instruction to discuss disease risk factors for cardiac and pulmonary disease and treatment options.  Reviews associated critical values for Overweight/Obesity, Hypertension, Cholesterol, and Diabetes.  Discusses basics of heart failure: signs/symptoms  and treatments.  Introduces Heart Failure Zone chart for action plan for heart failure.  Written material given at graduation. Flowsheet [facility]  Date 05/17/22  Educator SB  Instruction Review Code 1- Verbalizes Understanding       Core Components/Risk Factors/Patient Goals Review:   Goals and Risk Factor Review     Row Name 06/07/22 0802 07/07/22 0804           Core Components/Risk Factors/Patient Goals Review   Personal Goals Review Improve shortness of breath with ADL's Improve shortness of breath with ADL's      Review Spoke to patient about their shortness of breath and what they can do to improve. Patient has been informed of breathing techniques when starting the program. Patient is informed to tell staff if they have had any med changes and that certain meds they are taking or not taking can be causing shortness of breath. Greg Marya Amslers like he has improved with his ADLs. He sometimes gets winded but knows how to PLB to help him recover. He  is taken Trellegy, rescue inhaler and Albuterol Nebulizer. He has no questions about his medications and takes them properly.      Expected Outcomes Short: Attend [facility] regularly to improve shortness of breath with ADL's. Long: maintain independence with ADL's Short: take medications as prescribed. Long: maintain medications independently.               Core Components/Risk Factors/Patient Goals at Discharge (Final Review):   Goals and Risk Factor Review - 07/07/22 0804       Core Components/Risk Factors/Patient Goals Review   Personal Goals Review Improve shortness of breath with ADL's    Review Marya Amsler feels like he has improved with his ADLs. He sometimes gets winded but knows how to PLB to help him recover. He is taken Trellegy, rescue inhaler and Albuterol Nebulizer. He has no questions about his medications and takes them properly.    Expected Outcomes Short: take  medications as prescribed. Long: maintain medications independently.             ITP Comments:  ITP Comments     Row Name 05/11/22 1010 05/15/22 1430 05/24/22 1123 06/21/22 1057 07/19/22 1519   ITP Comments Virtual Visit completed. Patient informed on EP and RD appointment and 6 Minute walk test. Patient also informed of patient health questionnaires on My Chart. Patient Verbalizes understanding. Visit diagnosis can be found in [facility] 05/08/2022. Completed 6MWT and gym orientation. Initial ITP created and sent for review to Dr. Zetta Bills, Medical Director. 30 Day review completed. Medical Director ITP review done, changes made as directed, and signed approval by Medical Director.    new to program 30 Day review completed. Medical Director ITP review done, changes made as directed, and signed approval by Medical Director. 30 day review completed. ITP sent to Dr. Zetta Bills, Medical Director of  Pulmonary Rehab. Continue with ITP unless changes are made by physician.            Comments: 30 day review

## 2022-07-24 ENCOUNTER — Encounter: Payer: Medicare PPO | Admitting: *Deleted

## 2022-07-24 VITALS — Ht 71.4 in | Wt 182.6 lb

## 2022-07-24 DIAGNOSIS — J449 Chronic obstructive pulmonary disease, unspecified: Secondary | ICD-10-CM | POA: Diagnosis not present

## 2022-07-24 NOTE — Progress Notes (Signed)
Daily Session Note  Patient Details  Name: Bryan Mcclain MRN: [id number] Date of Birth: November 23, 1945 Referring Provider:   April Manson Pulmonary Rehab from 05/15/2022 in [facility] Cardiac and Pulmonary Rehab  Referring Provider Emily Filbert MD       Encounter Date: 07/24/2022  Check In:  Session Check In - 07/24/22 0814       Check-In   Supervising physician immediately available to respond to emergencies See telemetry face sheet for immediately available ER MD    Location [facility]-Cardiac & Pulmonary Rehab    Staff Present Darlyne Russian, RN, Doyce Para, BS, ACSM CEP, Exercise Physiologist;Joseph Tessie Fass, [place]    Virtual Visit No    Medication changes reported     No    Fall or balance concerns reported    No    Warm-up and Cool-down Performed on first and last piece of equipment    Resistance Training Performed Yes    VAD Patient? No    PAD/SET Patient? No      Pain Assessment   Currently in Pain? No/denies                Social History   Tobacco Use  Smoking Status Former   Packs/day: 1.00   Years: 42.00   Total pack years: 42.00   Types: Cigarettes   Quit date: 11/01/2021   Years since quitting: 0.7  Smokeless Tobacco Never    Goals Met:  Independence with exercise equipment Exercise tolerated well No report of concerns or symptoms today Strength training completed today  Goals Unmet:  Not Applicable  Comments: Pt able to follow exercise prescription today without complaint.  Will continue to monitor for progression.    Dr. Emily Filbert is Medical Director for [place].  Dr. Ottie Glazier is Medical Director for [facility] Pulmonary Rehabilitation.

## 2022-07-24 NOTE — Patient Instructions (Signed)
Discharge Patient Instructions  Patient Details  Name: Bryan Mcclain MRN: [id number] Date of Birth: 06-Aug-1945 Referring Provider:  Rusty Aus, MD   Number of Visits: 36  Reason for Discharge:  Patient reached a stable level of exercise. Patient independent in their exercise. Patient has met program and personal goals.  Smoking History:  Social History   Tobacco Use  Smoking Status Former   Packs/day: 1.00   Years: 42.00   Total pack years: 42.00   Types: Cigarettes   Quit date: 11/01/2021   Years since quitting: 0.7  Smokeless Tobacco Never    Diagnosis:  Chronic obstructive pulmonary disease, unspecified COPD type ([place])  Initial Exercise Prescription:  Initial Exercise Prescription - 05/15/22 1400       Date of Initial Exercise RX and Referring Provider   Date 05/15/22    Referring Provider Emily Filbert MD      Oxygen   Maintain Oxygen Saturation 88% or higher      Treadmill   MPH 2    Grade 0.5    Minutes 15    METs 2.67      Recumbant Bike   Level 2    RPM 50    Watts 22    Minutes 15    METs 2      Elliptical   Level 1    Speed 2.5    Minutes 15    METs 2      T5 Nustep   Level 2    SPM 80    Minutes 15    METs 2      Biostep-RELP   Level 2    SPM 50    Minutes 15    METs 2      Track   Laps 31    Minutes 15    METs 2.69      Prescription Details   Frequency (times per week) 3    Duration Progress to 30 minutes of continuous aerobic without signs/symptoms of physical distress      Intensity   THRR 40-80% of Max Heartrate 110-132    Ratings of Perceived Exertion 11-13    Perceived Dyspnea 0-4      Progression   Progression Continue to progress workloads to maintain intensity without signs/symptoms of physical distress.      Resistance Training   Training Prescription Yes    Weight 3 lb    Reps 10-15             Discharge Exercise Prescription (Final Exercise Prescription Changes):  Exercise Prescription Changes  - 07/11/22 1500       Response to Exercise   Blood Pressure (Admit) 122/60    Blood Pressure (Exit) 124/60    Heart Rate (Admit) 92 bpm    Heart Rate (Exercise) 112 bpm    Heart Rate (Exit) 96 bpm    Oxygen Saturation (Admit) 92 %    Oxygen Saturation (Exercise) 90 %    Oxygen Saturation (Exit) 94 %    Rating of Perceived Exertion (Exercise) 14    Perceived Dyspnea (Exercise) 2    Symptoms SOB    Duration Continue with 30 min of aerobic exercise without signs/symptoms of physical distress.    Intensity THRR unchanged      Progression   Progression Continue to progress workloads to maintain intensity without signs/symptoms of physical distress.    Average METs 2.74      Resistance Training   Training Prescription Yes  Weight 4 lb    Reps 10-15      Interval Training   Interval Training No      Treadmill   MPH 1.8    Grade 0    Minutes 15    METs 2.38      Recumbant Bike   Level 3    Watts 30    Minutes 15    METs 3.15      T5 Nustep   Level 3    Minutes 15    METs 2.1      Biostep-RELP   Level 3    Minutes 15    METs 3      Track   Laps 30    Minutes 15    METs 2.63      Oxygen   Maintain Oxygen Saturation 88% or higher             Functional Capacity:  6 Minute Walk     Row Name 05/15/22 1431 07/24/22 0818       6 Minute Walk   Phase Initial Discharge    Distance 1200 feet 1480 feet    Distance % Change -- 23.3 %    Distance Feet Change -- 280 ft    Walk Time 6 minutes 6 minutes    # of Rest Breaks 0 0    MPH 2.27 2.8    METS 2.89 3.33    RPE 14 13    Perceived Dyspnea  2 3    VO2 Peak 10.13 11.66    Symptoms Yes (comment) Yes (comment)    Comments SOB SOB    Resting HR 89 bpm 93 bpm    Resting BP 128/64 122/62    Resting Oxygen Saturation  91 % 91 %    Exercise Oxygen Saturation  during 6 min walk 88 % 87 %    Max Ex. HR 104 bpm 111 bpm    Max Ex. BP 162/74 152/76    2 Minute Post BP 146/74 144/78      Interval HR   1  Minute HR 97 102    2 Minute HR 99 107    3 Minute HR 100 110    4 Minute HR 102 109    5 Minute HR 102 110    6 Minute HR 104 111    2 Minute Post HR 96 105    Interval Heart Rate? Yes Yes      Interval Oxygen   Interval Oxygen? Yes Yes    Baseline Oxygen Saturation % 91 % 91 %    1 Minute Oxygen Saturation % 88 % 87 %    1 Minute Liters of Oxygen 0 L  Room Air 0 L  Room Air    2 Minute Oxygen Saturation % 90 % 89 %    2 Minute Liters of Oxygen 0 L 0 L    3 Minute Oxygen Saturation % 89 % 88 %    3 Minute Liters of Oxygen 0 L 0 L    4 Minute Oxygen Saturation % 90 % 89 %    4 Minute Liters of Oxygen 0 L 0 L    5 Minute Oxygen Saturation % 90 % 88 %    5 Minute Liters of Oxygen 0 L 0 L    6 Minute Oxygen Saturation % 90 % 88 %    6 Minute Liters of Oxygen 0 L 0 L    2 Minute Post  Oxygen Saturation % 92 % 91 %    2 Minute Post Liters of Oxygen 0 L 0 L           Nutrition & Weight - Outcomes:  Pre Biometrics - 05/15/22 1444       Pre Biometrics   Height 5' 11.4" (1.814 m)    Weight 176 lb 14.4 oz (80.2 kg)    Waist Circumference 36.25 inches    Hip Circumference 37.5 inches    Waist to Hip Ratio 0.97 %    BMI (Calculated) 24.38    Single Leg Stand 21.5 seconds             Post Biometrics - 07/24/22 0820        Post  Biometrics   Height 5' 11.4" (1.814 m)    Weight 182 lb 9.6 oz (82.8 kg)    Waist Circumference 39 inches    Hip Circumference 38.5 inches    Waist to Hip Ratio 1.01 %    BMI (Calculated) 25.17    Single Leg Stand 30 seconds             Nutrition:  Nutrition Therapy & Goals - 05/15/22 0920       Nutrition Therapy   RD appointment deferred Yes   Pt would not like to meet with RD at this time, will continue to follow up.     Personal Nutrition Goals   Nutrition Goal Pt would not like to meet with RD at this time, will continue to follow up.      Intervention Plan   Intervention Prescribe, educate and counsel regarding  individualized specific dietary modifications aiming towards targeted core components such as weight, hypertension, lipid management, diabetes, heart failure and other comorbidities.;Nutrition handout(s) given to patient.    Expected Outcomes Short Term Goal: Understand basic principles of dietary content, such as calories, fat, sodium, cholesterol and nutrients.;Short Term Goal: A plan has been developed with personal nutrition goals set during dietitian appointment.;Long Term Goal: Adherence to prescribed nutrition plan.           Goals reviewed with patient; copy given to patient.

## 2022-07-26 ENCOUNTER — Encounter: Payer: Medicare PPO | Admitting: *Deleted

## 2022-07-26 DIAGNOSIS — J449 Chronic obstructive pulmonary disease, unspecified: Secondary | ICD-10-CM | POA: Diagnosis not present

## 2022-07-26 NOTE — Progress Notes (Signed)
Daily Session Note  Patient Details  Name: Bryan Mcclain MRN: [id number] Date of Birth: 22-Sep-1945 Referring Provider:   April Manson Pulmonary Rehab from 05/15/2022 in [facility] Cardiac and Pulmonary Rehab  Referring Provider Emily Filbert MD       Encounter Date: 07/26/2022  Check In:  Session Check In - 07/26/22 0802       Check-In   Supervising physician immediately available to respond to emergencies See telemetry face sheet for immediately available ER MD    Location [facility]-Cardiac & Pulmonary Rehab    Staff Present Darlyne Russian, RN, ADN;Jessica Luan [place]ing, MA, RCEP, CCRP, CCET;Joseph [place], RCP,RRT,BSRT;Noah [place], [place], Exercise Physiologist    Virtual Visit No    Medication changes reported     No    Fall or balance concerns reported    No    Warm-up and Cool-down Performed on first and last piece of equipment    Resistance Training Performed Yes    VAD Patient? No    PAD/SET Patient? No      Pain Assessment   Currently in Pain? No/denies                Social History   Tobacco Use  Smoking Status Former   Packs/day: 1.00   Years: 42.00   Total pack years: 42.00   Types: Cigarettes   Quit date: 11/01/2021   Years since quitting: 0.7  Smokeless Tobacco Never    Goals Met:  Independence with exercise equipment Exercise tolerated well No report of concerns or symptoms today Strength training completed today  Goals Unmet:  Not Applicable  Comments: Pt able to follow exercise prescription today without complaint.  Will continue to monitor for progression.  Reviewed home exercise with pt today.  Pt plans to walking and joining a gym for exercise.  Reviewed THR, pulse, RPE, sign and symptoms, pulse oximetery and when to call 911 or MD.  Also discussed weather considerations and indoor options.  Pt voiced understanding.   Dr. Emily Filbert is Medical Director for [place].  Dr. Ottie Glazier is Medical Director for [facility] Pulmonary  Rehabilitation.

## 2022-07-28 ENCOUNTER — Encounter: Payer: Medicare PPO | Attending: Internal Medicine | Admitting: *Deleted

## 2022-07-28 DIAGNOSIS — Z5189 Encounter for other specified aftercare: Secondary | ICD-10-CM | POA: Insufficient documentation

## 2022-07-28 DIAGNOSIS — J449 Chronic obstructive pulmonary disease, unspecified: Secondary | ICD-10-CM | POA: Insufficient documentation

## 2022-07-28 NOTE — Progress Notes (Signed)
Daily Session Note  Patient Details  Name: Bryan Mcclain MRN: [id number] Date of Birth: [birth year] Referring Provider:   April Manson Pulmonary Rehab from 05/15/2022 in [facility] Cardiac and Pulmonary Rehab  Referring Provider Emily Filbert MD       Encounter Date: 07/28/2022  Check In:  Session Check In - 07/28/22 0756       Check-In   Supervising physician immediately available to respond to emergencies See telemetry face sheet for immediately available ER MD    Location [facility]-Cardiac & Pulmonary Rehab    Staff Present Darlyne Russian, RN, ADN;Jessica Luan Pulling, MA, RCEP, CCRP, CCET;Joseph [place], [place]    Virtual Visit No    Medication changes reported     No    Fall or balance concerns reported    No    Warm-up and Cool-down Performed on first and last piece of equipment    Resistance Training Performed Yes    VAD Patient? No    PAD/SET Patient? No      Pain Assessment   Currently in Pain? No/denies                Social History   Tobacco Use  Smoking Status Former   Packs/day: 1.00   Years: 42.00   Total pack years: 42.00   Types: Cigarettes   Quit date: 11/01/2021   Years since quitting: 0.7  Smokeless Tobacco Never    Goals Met:  Independence with exercise equipment Exercise tolerated well No report of concerns or symptoms today Strength training completed today  Goals Unmet:  Not Applicable  Comments: Pt able to follow exercise prescription today without complaint.  Will continue to monitor for progression.    Dr. Emily Filbert is Medical Director for [place].  Dr. Ottie Glazier is Medical Director for [facility] Pulmonary Rehabilitation.

## 2022-07-31 ENCOUNTER — Encounter: Payer: Medicare PPO | Admitting: *Deleted

## 2022-07-31 DIAGNOSIS — Z5189 Encounter for other specified aftercare: Secondary | ICD-10-CM | POA: Diagnosis not present

## 2022-07-31 DIAGNOSIS — J449 Chronic obstructive pulmonary disease, unspecified: Secondary | ICD-10-CM

## 2022-07-31 NOTE — Progress Notes (Signed)
Daily Session Note  Patient Details  Name: Bryan Mcclain MRN: [id number] Date of Birth: 11-Aug-1945 Referring Provider:   April Manson Pulmonary Rehab from 05/15/2022 in [facility] Cardiac and Pulmonary Rehab  Referring Provider Emily Filbert MD       Encounter Date: 07/31/2022  Check In:  Session Check In - 07/31/22 0755       Check-In   Supervising physician immediately available to respond to emergencies See telemetry face sheet for immediately available ER MD    Location [facility]Cardiac [facility] Pulmonary Rehab    Staff Present Darlyne Russian, RN, Doyce Para, BS, ACSM CEP, Exercise Physiologist;Joseph Tessie Fass, [place]    Virtual Visit No    Medication changes reported     No    Fall or balance concerns reported    No    Warm-up and Cool-down Performed on first and last piece of equipment    Resistance Training Performed Yes    VAD Patient? No    PAD/SET Patient? No      Pain Assessment   Currently in Pain? No/denies                Social History   Tobacco Use  Smoking Status Former   Packs/day: 1.00   Years: 42.00   Total pack years: 42.00   Types: Cigarettes   Quit date: 11/01/2021   Years since quitting: 0.7  Smokeless Tobacco Never    Goals Met:  Independence with exercise equipment Exercise tolerated well No report of concerns or symptoms today Strength training completed today  Goals Unmet:  Not Applicable  Comments: Pt able to follow exercise prescription today without complaint.  Will continue to monitor for progression.    Dr. Emily Filbert is Medical Director for [place].  Dr. Ottie Glazier is Medical Director for  [facility] Pulmonary Rehabilitation.

## 2022-08-02 ENCOUNTER — Encounter: Payer: Medicare PPO | Admitting: *Deleted

## 2022-08-02 DIAGNOSIS — J449 Chronic obstructive pulmonary disease, unspecified: Secondary | ICD-10-CM

## 2022-08-02 DIAGNOSIS — Z5189 Encounter for other specified aftercare: Secondary | ICD-10-CM | POA: Diagnosis not present

## 2022-08-02 NOTE — Progress Notes (Signed)
Daily Session Note  Patient Details  Name: Bryan Mcclain MRN: [id number] Date of Birth: 1946/01/27 Referring Provider:   April Manson Pulmonary Rehab from 05/15/2022 in [facility] Cardiac and Pulmonary Rehab  Referring Provider Emily Filbert MD       Encounter Date: 08/02/2022  Check In:  Session Check In - 08/02/22 0803       Check-In   Supervising physician immediately available to respond to emergencies See telemetry face sheet for immediately available ER MD    Location [facility]-Cardiac & Pulmonary Rehab    Staff Present Darlyne Russian, RN, ADN;Jessica Luan Pulling, MA, RCEP, CCRP, CCET;Noah [place], BS, Exercise Physiologist    Virtual Visit No    Medication changes reported     No    Fall or balance concerns reported    No    Warm-up and Cool-down Performed on first and last piece of equipment    Resistance Training Performed Yes    VAD Patient? No    PAD/SET Patient? No      Pain Assessment   Currently in Pain? No/denies                Social History   Tobacco Use  Smoking Status Former   Packs/day: 1.00   Years: 42.00   Total pack years: 42.00   Types: Cigarettes   Quit date: 11/01/2021   Years since quitting: 0.7  Smokeless Tobacco Never    Goals Met:  Independence with exercise equipment Exercise tolerated well No report of concerns or symptoms today Strength training completed today  Goals Unmet:  Not Applicable  Comments: Pt able to follow exercise prescription today without complaint.  Will continue to monitor for progression.    Dr. Emily Filbert is Medical Director for [place].  Dr. Ottie Glazier is Medical Director for [facility] Pulmonary Rehabilitation.

## 2022-08-04 ENCOUNTER — Encounter: Payer: Medicare PPO | Admitting: *Deleted

## 2022-08-04 DIAGNOSIS — J449 Chronic obstructive pulmonary disease, unspecified: Secondary | ICD-10-CM

## 2022-08-04 DIAGNOSIS — Z5189 Encounter for other specified aftercare: Secondary | ICD-10-CM | POA: Diagnosis not present

## 2022-08-04 NOTE — Progress Notes (Signed)
Daily Session Note  Patient Details  Name: Bryan Mcclain MRN: [id number] Date of Birth: 1946/02/05 Referring Provider:   April Manson Pulmonary Rehab from 05/15/2022 in [facility] Cardiac and Pulmonary Rehab  Referring Provider Emily Filbert MD       Encounter Date: 08/04/2022  Check In:  Session Check In - 08/04/22 0907       Check-In   Supervising physician immediately available to respond to emergencies See telemetry face sheet for immediately available ER MD    Location [facility]-Cardiac & Pulmonary Rehab    Staff Present Heath Lark, RN, BSN, CCRP;Jessica [place], MA, RCEP, CCRP, CCET;Joseph [place], [place]    Virtual Visit No    Medication changes reported     No    Fall or balance concerns reported    No    Warm-up and Cool-down Performed on first and last piece of equipment    Resistance Training Performed Yes    VAD Patient? No    PAD/SET Patient? No      Pain Assessment   Currently in Pain? No/denies                Social History   Tobacco Use  Smoking Status Former   Packs/day: 1.00   Years: 42.00   Total pack years: 42.00   Types: Cigarettes   Quit date: 11/01/2021   Years since quitting: 0.7  Smokeless Tobacco Never    Goals Met:  Proper associated with RPD/PD & O2 Sat Independence with exercise equipment Exercise tolerated well No report of concerns or symptoms today  Goals Unmet:  Not Applicable  Comments: Pt able to follow exercise prescription today without complaint.  Will continue to monitor for progression.    Dr. Emily Filbert is Medical Director for [place].  Dr. Ottie Glazier is Medical Director for [facility] Pulmonary Rehabilitation.

## 2022-08-07 ENCOUNTER — Encounter: Payer: Medicare PPO | Admitting: *Deleted

## 2022-08-07 DIAGNOSIS — Z5189 Encounter for other specified aftercare: Secondary | ICD-10-CM | POA: Diagnosis not present

## 2022-08-07 DIAGNOSIS — J449 Chronic obstructive pulmonary disease, unspecified: Secondary | ICD-10-CM

## 2022-08-07 NOTE — Progress Notes (Signed)
Daily Session Note  Patient Details  Name: Bryan Mcclain MRN: [id number] Date of Birth: 1946-01-02 Referring Provider:   April Manson Pulmonary Rehab from 05/15/2022 in [facility] Cardiac and Pulmonary Rehab  Referring Provider Emily Filbert MD       Encounter Date: 08/07/2022  Check In:  Session Check In - 08/07/22 0830       Check-In   Supervising physician immediately available to respond to emergencies See telemetry face sheet for immediately available ER MD    Location [facility]Cardiac & Pulmonary Rehab    Staff Present Darlyne Russian, RN, Doyce Para, BS, ACSM CEP, Exercise Physiologist;Joseph Tessie Fass, [place]    Virtual Visit No    Medication changes reported     No    Fall or balance concerns reported    No    Warm-up and Cool-down Performed on first and last piece of equipment    Resistance Training Performed Yes    VAD Patient? No    PAD/SET Patient? No      Pain Assessment   Currently in Pain? No/denies                Social History   Tobacco Use  Smoking Status Former   Packs/day: 1.00   Years: 42.00   Total pack years: 42.00   Types: Cigarettes   Quit date: 11/01/2021   Years since quitting: 0.7  Smokeless Tobacco Never    Goals Met:  Independence with exercise equipment Exercise tolerated well No report of concerns or symptoms today Strength training completed today  Goals Unmet:  Not Applicable  Comments: Pt able to follow exercise prescription today without complaint.  Will continue to monitor for progression.    Dr. Emily Filbert is Medical Director for [place].  Dr. Ottie Glazier is Medical Director for [facility] Pulmonary Rehabilitation.

## 2022-08-09 ENCOUNTER — Encounter: Payer: Medicare PPO | Admitting: *Deleted

## 2022-08-09 DIAGNOSIS — J449 Chronic obstructive pulmonary disease, unspecified: Secondary | ICD-10-CM

## 2022-08-09 DIAGNOSIS — Z5189 Encounter for other specified aftercare: Secondary | ICD-10-CM | POA: Diagnosis not present

## 2022-08-09 NOTE — Progress Notes (Signed)
Daily Session Note  Patient Details  Name: Bryan Mcclain MRN: [id number] Date of Birth: 03/28/1946 Referring Provider:   April Manson Pulmonary Rehab from 05/15/2022 in [facility] Cardiac and Pulmonary Rehab  Referring Provider Emily Filbert MD       Encounter Date: 08/09/2022  Check In:  Session Check In - 08/09/22 0748       Check-In   Supervising physician immediately available to respond to emergencies See telemetry face sheet for immediately available ER MD    Location [facility]Cardiac [facility] Pulmonary Rehab    Staff Present Darlyne Russian, RN, ADN;Jessica Luan Pulling, MA, RCEP, CCRP, CCET;Joseph [place], RCP,RRT,BSRT;Noah [place], [place], Exercise Physiologist    Virtual Visit No    Medication changes reported     No    Fall or balance concerns reported    No    Warm-up and Cool-down Performed on first and last piece of equipment    Resistance Training Performed Yes    VAD Patient? No    PAD/SET Patient? No      Pain Assessment   Currently in Pain? No/denies                Social History   Tobacco Use  Smoking Status Former   Packs/day: 1.00   Years: 42.00   Total pack years: 42.00   Types: Cigarettes   Quit date: 11/01/2021   Years since quitting: 0.7  Smokeless Tobacco Never    Goals Met:  Independence with exercise equipment Exercise tolerated well No report of concerns or symptoms today Strength training completed today  Goals Unmet:  Not Applicable  Comments: Pt able to follow exercise prescription today without complaint.  Will continue to monitor for progression.    Dr. Emily Filbert is Medical Director for [place].  Dr. Ottie Glazier is Medical Director for [facility] Pulmonary Rehabilitation.

## 2022-08-11 ENCOUNTER — Encounter: Payer: Medicare PPO | Admitting: *Deleted

## 2022-08-11 DIAGNOSIS — Z5189 Encounter for other specified aftercare: Secondary | ICD-10-CM | POA: Diagnosis not present

## 2022-08-11 DIAGNOSIS — J449 Chronic obstructive pulmonary disease, unspecified: Secondary | ICD-10-CM

## 2022-08-11 NOTE — Progress Notes (Signed)
Daily Session Note  Patient Details  Name: Bryan Mcclain MRN: [id number] Date of Birth: [birth year] Referring Provider:   April Manson Pulmonary Rehab from 05/15/2022 in [facility] Cardiac and Pulmonary Rehab  Referring Provider Emily Filbert MD       Encounter Date: 08/11/2022  Check In:  Session Check In - 08/11/22 0841       Check-In   Supervising physician immediately available to respond to emergencies See telemetry face sheet for immediately available ER MD    Location [facility]Cardiac [facility] Pulmonary Rehab    Staff Present Heath Lark, RN, BSN, CCRP;Jessica [place], MA, RCEP, CCRP, CCET;Joseph [place], [place]    Virtual Visit No    Medication changes reported     No    Fall or balance concerns reported    No    Warm-up and Cool-down Performed on first and last piece of equipment    Resistance Training Performed Yes    VAD Patient? No    PAD/SET Patient? No      Pain Assessment   Currently in Pain? No/denies                Social History   Tobacco Use  Smoking Status Former   Packs/day: 1.00   Years: 42.00   Additional pack years: 0.00   Total pack years: 42.00   Types: Cigarettes   Quit date: 11/01/2021   Years since quitting: 0.7  Smokeless Tobacco Never    Goals Met:  Proper associated with RPD/PD & O2 Sat Independence with exercise equipment Exercise tolerated well No report of concerns or symptoms today  Goals Unmet:  Not Applicable  Comments: Pt able to follow exercise prescription today without complaint.  Will continue to monitor for progression.    Dr. Emily Filbert is Medical Director for [place].  Dr. Ottie Glazier is Medical Director for [facility] Pulmonary Rehabilitation.

## 2022-08-14 ENCOUNTER — Encounter: Payer: Medicare PPO | Admitting: *Deleted

## 2022-08-14 DIAGNOSIS — Z5189 Encounter for other specified aftercare: Secondary | ICD-10-CM | POA: Diagnosis not present

## 2022-08-14 DIAGNOSIS — J449 Chronic obstructive pulmonary disease, unspecified: Secondary | ICD-10-CM

## 2022-08-14 NOTE — Progress Notes (Signed)
Discharge Summary: Bryan Mcclain (DOB: [birth year])   Bryan Mcclain graduated today from  rehab with 36 sessions completed.  Details of the patient's exercise prescription and what He needs to do in order to continue the prescription and progress were discussed with patient.  Patient was given a copy of prescription and goals.  Patient verbalized understanding.  [name] plans to continue to exercise by walking and plans to join a gym.   [place] Name 05/15/22 1431 07/24/22 0818       6 Minute Walk   Phase Initial Discharge    Distance 1200 feet 1480 feet    Distance % Change -- 23.3 %    Distance Feet Change -- 280 ft    Walk Time 6 minutes 6 minutes    # of Rest Breaks 0 0    MPH 2.27 2.8    METS 2.89 3.33    RPE 14 13    Perceived Dyspnea  2 3    VO2 Peak 10.13 11.66    Symptoms Yes (comment) Yes (comment)    Comments SOB SOB    Resting HR 89 bpm 93 bpm    Resting BP 128/64 122/62    Resting Oxygen Saturation  91 % 91 %    Exercise Oxygen Saturation  during 6 min walk 88 % 87 %    Max Ex. HR 104 bpm 111 bpm    Max Ex. BP 162/74 152/76    2 Minute Post BP 146/74 144/78      Interval HR   1 Minute HR 97 102    2 Minute HR 99 107    3 Minute HR 100 110    4 Minute HR 102 109    5 Minute HR 102 110    6 Minute HR 104 111    2 Minute Post HR 96 105    Interval Heart Rate? Yes Yes      Interval Oxygen   Interval Oxygen? Yes Yes    Baseline Oxygen Saturation % 91 % 91 %    1 Minute Oxygen Saturation % 88 % 87 %    1 Minute Liters of Oxygen 0 L  Room Air 0 L  Room Air    2 Minute Oxygen Saturation % 90 % 89 %    2 Minute Liters of Oxygen 0 L 0 L    3 Minute Oxygen Saturation % 89 % 88 %    3 Minute Liters of Oxygen 0 L 0 L    4 Minute Oxygen Saturation % 90 % 89 %    4 Minute Liters of Oxygen 0 L 0 L    5 Minute Oxygen Saturation % 90 % 88 %    5 Minute Liters of Oxygen 0 L 0 L    6 Minute Oxygen Saturation % 90 % 88 %    6 Minute Liters of Oxygen 0 L 0 L    2  Minute Post Oxygen Saturation % 92 % 91 %    2 Minute Post Liters of Oxygen 0 L 0 L

## 2022-08-14 NOTE — Progress Notes (Signed)
Pulmonary Individual Treatment Plan  Patient Details  Name: Bryan Mcclain MRN: [id number] Date of Birth: 05-Dec-1945 Referring Provider:   April Manson Pulmonary Rehab from 05/15/2022 in [facility] Cardiac and Pulmonary Rehab  Referring Provider Emily Filbert MD       Initial Encounter Date:  Flowsheet Row Pulmonary Rehab from 05/15/2022 in [facility] Cardiac and Pulmonary Rehab  Date 05/15/22       Visit Diagnosis: Chronic obstructive pulmonary disease, unspecified COPD type ([place])  Patient's Home Medications on Admission:  Current Outpatient Medications:    albuterol (PROVENTIL) (2.5 MG/3ML) 0.083% nebulizer solution, Take 2.5 mg by nebulization 4 (four) times daily., Disp: , Rfl:    albuterol (VENTOLIN HFA) 108 (90 Base) MCG/ACT inhaler, Inhale 2 puffs into the lungs every 6 (six) hours as needed. (Patient not taking: Reported on 05/11/2022), Disp: , Rfl:    albuterol (VENTOLIN HFA) 108 (90 Base) MCG/ACT inhaler, Inhale into the lungs., Disp: , Rfl:    alfuzosin (UROXATRAL) 10 MG 24 hr tablet, Take 10 mg by mouth daily. (Patient not taking: Reported on 05/11/2022), Disp: , Rfl:    alfuzosin (UROXATRAL) 10 MG 24 hr tablet, Take 1 tablet by mouth daily., Disp: , Rfl:    aspirin 81 MG chewable tablet, Chew 81 mg by mouth daily. (Patient not taking: Reported on 05/11/2022), Disp: , Rfl:    benzonatate (TESSALON) 200 MG capsule, Take 200 mg by mouth 3 (three) times daily as needed., Disp: , Rfl:    cetirizine (ZYRTEC) 10 MG tablet, Take 10 mg by mouth daily., Disp: , Rfl:    cetirizine (ZYRTEC) 10 MG tablet, Take by mouth. (Patient not taking: Reported on 05/11/2022), Disp: , Rfl:    diltiazem (CARDIZEM CD) 180 MG 24 hr capsule, Take by mouth., Disp: , Rfl:    dutasteride (AVODART) 0.5 MG capsule, Take 0.5 mg by mouth daily., Disp: , Rfl:    fluticasone (FLONASE) 50 MCG/ACT nasal spray, Place into the nose., Disp: , Rfl:    hydrochlorothiazide (HYDRODIURIL) 12.5 MG tablet, Take 12.5 mg by mouth  daily., Disp: , Rfl:    ipratropium-albuterol (DUONEB) 0.5-2.5 (3) MG/3ML SOLN, Take 3 mLs by nebulization 2 (two) times daily. (Patient not taking: Reported on 05/11/2022), Disp: , Rfl:    ipratropium-albuterol (DUONEB) 0.5-2.5 (3) MG/3ML SOLN, Inhale into the lungs., Disp: , Rfl:    loratadine (CLARITIN) 10 MG tablet, Take 1 tablet (10 mg total) by mouth daily. (Patient not taking: Reported on 05/11/2022), Disp: 30 tablet, Rfl: 1   loratadine (CLARITIN) 10 MG tablet, Take 1 tablet by mouth daily as needed., Disp: , Rfl:    LUMIGAN 0.01 % SOLN, Place 1 drop into both eyes every evening., Disp: , Rfl:    Melatonin 10 MG CAPS, Take by mouth., Disp: , Rfl:    naproxen sodium (ALEVE) 220 MG tablet, Take by mouth., Disp: , Rfl:    nystatin (MYCOSTATIN) 100000 UNIT/ML suspension, Take 5 mLs by mouth 4 (four) times daily., Disp: , Rfl:    olmesartan (BENICAR) 20 MG tablet, Take 20 mg by mouth daily., Disp: , Rfl:    predniSONE (DELTASONE) 5 MG tablet, Advised to take prednisone 50 mg daily and wean by 5 mg every day. (Patient not taking: Reported on 05/11/2022), Disp: 55 tablet, Rfl: 0   simvastatin (ZOCOR) 40 MG tablet, Take 40 mg by mouth daily. (Patient not taking: Reported on 05/11/2022), Disp: , Rfl:    simvastatin (ZOCOR) 40 MG tablet, Take 1 tablet by mouth at bedtime., Disp: ,  Rfl:    TRELEGY ELLIPTA 100-62.5-25 MCG/ACT AEPB, Inhale 1 puff into the lungs daily. (Patient not taking: Reported on 05/11/2022), Disp: , Rfl:    TRELEGY ELLIPTA 200-62.5-25 MCG/ACT AEPB, Inhale 1 puff into the lungs daily., Disp: , Rfl:    verapamil (VERELAN PM) 240 MG 24 hr capsule, Take 240 mg by mouth daily. (Patient not taking: Reported on 05/11/2022), Disp: , Rfl:   Past Medical History: Past Medical History:  Diagnosis Date   COPD (chronic obstructive pulmonary disease) ([place])    Hyperlipidemia    Hypertension     Tobacco Use: Social History   Tobacco Use  Smoking Status Former   Packs/day: 1.00    Years: 42.00   Additional pack years: 0.00   Total pack years: 42.00   Types: Cigarettes   Quit date: 11/01/2021   Years since quitting: 0.7  Smokeless Tobacco Never    Labs: Review Flowsheet       Latest Ref Rng & Units 11/08/2021  Labs for ITP Cardiac and Pulmonary Rehab  Hemoglobin A1c 4.8 - 5.6 % 5.8      Pulmonary Assessment Scores:  Pulmonary Assessment Scores     Row Name 05/15/22 1445 07/24/22 0821 07/28/22 0820     ADL UCSD   ADL Phase Entry Exit --   SOB Score total 58 -- 63   Rest 1 -- 1   Walk 1 -- 2   Stairs 3 -- 3   Bath 3 -- 3   Dress 2 -- 2   Shop 3 -- 3     CAT Score   CAT Score 22 -- 21     mMRC Score   mMRC Score 1 2 --            UCSD: Self-administered rating of dyspnea associated with activities of daily living (ADLs) 6-point scale (0 = "not at all" to 5 = "maximal or unable to do because of breathlessness")  Scoring Scores range from 0 to 120.  Minimally important difference is 5 units  CAT: CAT can identify the health impairment of COPD patients and is better correlated with disease progression.  CAT has a scoring range of zero to 40. The CAT score is classified into four groups of low (less than 10), medium (10 - 20), high (21-30) and very high (31-40) based on the impact level of disease on health status. A CAT score over 10 suggests significant symptoms.  A worsening CAT score could be explained by an exacerbation, poor medication adherence, poor inhaler technique, or progression of COPD or comorbid conditions.  CAT MCID is 2 points  mMRC: mMRC (Modified Medical Research Council) Dyspnea Scale is used to assess the degree of baseline functional disability in patients of respiratory disease due to dyspnea. No minimal important difference is established. A decrease in score of 1 point or greater is considered a positive change.   Pulmonary Function Assessment:  Pulmonary Function Assessment - 05/11/22 1011       Breath   Shortness  of Breath Yes;Limiting activity             Exercise Target Goals: Exercise Program Goal: Individual exercise prescription set using results from initial 6 min walk test and THRR while considering  patient's activity barriers and safety.   Exercise Prescription Goal: Initial exercise prescription builds to 30-45 minutes a day of aerobic activity, 2-3 days per week.  Home exercise guidelines will be given to patient during program as part of exercise prescription that  the participant will acknowledge.  Education: Aerobic Exercise: - Group verbal and visual presentation on the components of exercise prescription. Introduces F.I.T.T principle from ACSM for exercise prescriptions.  Reviews F.I.T.T. principles of aerobic exercise including progression. Written material given at graduation. Flowsheet Row Pulmonary Rehab from 08/09/2022 in [facility] Cardiac and Pulmonary Rehab  Date 06/14/22  Educator [facility]  Instruction Review Code 1- Verbalizes Understanding       Education: Resistance Exercise: - Group verbal and visual presentation on the components of exercise prescription. Introduces F.I.T.T principle from ACSM for exercise prescriptions  Reviews F.I.T.T. principles of resistance exercise including progression. Written material given at graduation. Flowsheet Row Pulmonary Rehab from 08/09/2022 in [facility] Cardiac and Pulmonary Rehab  Date 06/21/22  Educator [facility]  Instruction Review Code 1- Verbalizes Understanding        Education: Exercise & Equipment Safety: - Individual verbal instruction and demonstration of equipment use and safety with use of the equipment. Flowsheet Row Pulmonary Rehab from 08/09/2022 in [facility] Cardiac and Pulmonary Rehab  Date 05/11/22  Educator [facility]  Instruction Review Code 1- Verbalizes Understanding       Education: Exercise Physiology & General Exercise Guidelines: - Group verbal and written instruction with models to review the exercise physiology of the  cardiovascular system and associated critical values. Provides general exercise guidelines with specific guidelines to those with heart or lung disease.    Education: Flexibility, Balance, Mind/Body Relaxation: - Group verbal and visual presentation with interactive activity on the components of exercise prescription. Introduces F.I.T.T principle from ACSM for exercise prescriptions. Reviews F.I.T.T. principles of flexibility and balance exercise training including progression. Also discusses the mind body connection.  Reviews various relaxation techniques to help reduce and manage stress (i.e. Deep breathing, progressive muscle relaxation, and visualization). Balance handout provided to take home. Written material given at graduation. Flowsheet Row Pulmonary Rehab from 08/09/2022 in [facility] Cardiac and Pulmonary Rehab  Date 06/28/22  Educator [facility]  Instruction Review Code 1- Verbalizes Understanding       Activity Barriers & Risk Stratification:  Activity Barriers & Cardiac Risk Stratification - 05/15/22 1435       Activity Barriers & Cardiac Risk Stratification   Activity Barriers Shortness of Breath;Deconditioning;Muscular Weakness             6 Minute Walk:  6 Minute Walk     Row Name 05/15/22 1431 07/24/22 0818       6 Minute Walk   Phase Initial Discharge    Distance 1200 feet 1480 feet    Distance % Change -- 23.3 %    Distance Feet Change -- 280 ft    Walk Time 6 minutes 6 minutes    # of Rest Breaks 0 0    MPH 2.27 2.8    METS 2.89 3.33    RPE 14 13    Perceived Dyspnea  2 3    VO2 Peak 10.13 11.66    Symptoms Yes (comment) Yes (comment)    Comments SOB SOB    Resting HR 89 bpm 93 bpm    Resting BP 128/64 122/62    Resting Oxygen Saturation  91 % 91 %    Exercise Oxygen Saturation  during 6 min walk 88 % 87 %    Max Ex. HR 104 bpm 111 bpm    Max Ex. BP 162/74 152/76    2 Minute Post BP 146/74 144/78      Interval HR   1 Minute HR 97 102  2 Minute HR 99 107     3 Minute HR 100 110    4 Minute HR 102 109    5 Minute HR 102 110    6 Minute HR 104 111    2 Minute Post HR 96 105    Interval Heart Rate? Yes Yes      Interval Oxygen   Interval Oxygen? Yes Yes    Baseline Oxygen Saturation % 91 % 91 %    1 Minute Oxygen Saturation % 88 % 87 %    1 Minute Liters of Oxygen 0 L  Room Air 0 L  Room Air    2 Minute Oxygen Saturation % 90 % 89 %    2 Minute Liters of Oxygen 0 L 0 L    3 Minute Oxygen Saturation % 89 % 88 %    3 Minute Liters of Oxygen 0 L 0 L    4 Minute Oxygen Saturation % 90 % 89 %    4 Minute Liters of Oxygen 0 L 0 L    5 Minute Oxygen Saturation % 90 % 88 %    5 Minute Liters of Oxygen 0 L 0 L    6 Minute Oxygen Saturation % 90 % 88 %    6 Minute Liters of Oxygen 0 L 0 L    2 Minute Post Oxygen Saturation % 92 % 91 %    2 Minute Post Liters of Oxygen 0 L 0 L            Oxygen Initial Assessment:  Oxygen Initial Assessment - 05/11/22 1010       Home Oxygen   Home Oxygen Device Home Concentrator;E-Tanks;Portable Concentrator    Sleep Oxygen Prescription Continuous   Patients states that he could not tolerate CPAP   Liters per minute 2    Home Exercise Oxygen Prescription None    Home Resting Oxygen Prescription None    Compliance with Home Oxygen Use Yes      Initial 6 min Walk   Oxygen Used None      Program Oxygen Prescription   Program Oxygen Prescription None      Intervention   Short Term Goals To learn and understand importance of monitoring SPO2 with pulse oximeter and demonstrate accurate use of the pulse oximeter.;To learn and demonstrate proper pursed lip breathing techniques or other breathing techniques. ;To learn and exhibit compliance with exercise, home and travel O2 prescription;To learn and understand importance of maintaining oxygen saturations>88%;To learn and demonstrate proper use of respiratory medications    Long  Term Goals Exhibits compliance with exercise, home  and travel O2  prescription;Maintenance of O2 saturations>88%;Compliance with respiratory medication;Demonstrates proper use of MDI's;Verbalizes importance of monitoring SPO2 with pulse oximeter and return demonstration;Exhibits proper breathing techniques, such as pursed lip breathing or other method taught during program session             Oxygen Re-Evaluation:  Oxygen Re-Evaluation     Row Name 06/07/22 0759 06/28/22 1149 07/24/22 0841         Program Oxygen Prescription   Program Oxygen Prescription None None None       Home Oxygen   Home Oxygen Device Home Concentrator;E-Tanks;Portable Concentrator Home Concentrator;E-Tanks;Portable Concentrator Home Concentrator;E-Tanks;Portable Concentrator     Sleep Oxygen Prescription Continuous Continuous Continuous     Liters per minute 2 2 2      Home Exercise Oxygen Prescription None None None     Home Resting Oxygen Prescription  None None None     Compliance with Home Oxygen Use Yes Yes Yes       Goals/Expected Outcomes   Short Term Goals To learn and understand importance of maintaining oxygen saturations>88%;To learn and understand importance of monitoring SPO2 with pulse oximeter and demonstrate accurate use of the pulse oximeter. To learn and demonstrate proper pursed lip breathing techniques or other breathing techniques.  To learn and demonstrate proper pursed lip breathing techniques or other breathing techniques. ;To learn and understand importance of monitoring SPO2 with pulse oximeter and demonstrate accurate use of the pulse oximeter.;To learn and exhibit compliance with exercise, home and travel O2 prescription;To learn and understand importance of maintaining oxygen saturations>88%;To learn and demonstrate proper use of respiratory medications     Long  Term Goals Maintenance of O2 saturations>88%;Verbalizes importance of monitoring SPO2 with pulse oximeter and return demonstration Exhibits proper breathing techniques, such as pursed lip  breathing or other method taught during program session Exhibits proper breathing techniques, such as pursed lip breathing or other method taught during program session;Exhibits compliance with exercise, home  and travel O2 prescription;Maintenance of O2 saturations>88%;Verbalizes importance of monitoring SPO2 with pulse oximeter and return demonstration;Compliance with respiratory medication;Demonstrates proper use of MDI's     Comments He does have a pulse oximeter to check his oxygen saturation at home. Informed him and explained why it is important to have one. Reviewed that oxygen saturations should be 88 percent and above. Patient verbalizes understanding. Diaphragmatic and PLB breathing explained and performed with patient. Patient has a better understanding of how to do these exercises to help with breathing performance and relaxation. Patient performed breathing techniques adequately and to practice further at home. Bryan Mcclain has done well in rehab.  He is consistent with his pursed lip breathing.  He does well on his meds and uses his oxygen at night.  He feels that his breathing has improved since being in rehab.     Goals/Expected Outcomes Short: monitor oxygen at home with exertion. Long: maintain oxygen saturations above 88 percent independently. Short: practice PLB and diaphragmatic breathing at home. Long: Use PLB and diaphragmatic breathing independently post [employer]. Conitnue to use PLB to assist with breath control and continued compliance with oxygen at night.              Oxygen Discharge (Final Oxygen Re-Evaluation):  Oxygen Re-Evaluation - 07/24/22 0841       Program Oxygen Prescription   Program Oxygen Prescription None      Home Oxygen   Home Oxygen Device Home Concentrator;E-Tanks;Portable Concentrator    Sleep Oxygen Prescription Continuous    Liters per minute 2    Home Exercise Oxygen Prescription None    Home Resting Oxygen Prescription None    Compliance with Home  Oxygen Use Yes      Goals/Expected Outcomes   Short Term Goals To learn and demonstrate proper pursed lip breathing techniques or other breathing techniques. ;To learn and understand importance of monitoring SPO2 with pulse oximeter and demonstrate accurate use of the pulse oximeter.;To learn and exhibit compliance with exercise, home and travel O2 prescription;To learn and understand importance of maintaining oxygen saturations>88%;To learn and demonstrate proper use of respiratory medications    Long  Term Goals Exhibits proper breathing techniques, such as pursed lip breathing or other method taught during program session;Exhibits compliance with exercise, home  and travel O2 prescription;Maintenance of O2 saturations>88%;Verbalizes importance of monitoring SPO2 with pulse oximeter and return demonstration;Compliance with respiratory medication;Demonstrates proper  use of MDI's    Comments Bryan Mcclain has done well in rehab.  He is consistent with his pursed lip breathing.  He does well on his meds and uses his oxygen at night.  He feels that his breathing has improved since being in rehab.    Goals/Expected Outcomes Conitnue to use PLB to assist with breath control and continued compliance with oxygen at night.             Initial Exercise Prescription:  Initial Exercise Prescription - 05/15/22 1400       Date of Initial Exercise RX and Referring Provider   Date 05/15/22    Referring Provider Emily Filbert MD      Oxygen   Maintain Oxygen Saturation 88% or higher      Treadmill   MPH 2    Grade 0.5    Minutes 15    METs 2.67      Recumbant Bike   Level 2    RPM 50    Watts 22    Minutes 15    METs 2      Elliptical   Level 1    Speed 2.5    Minutes 15    METs 2      T5 Nustep   Level 2    SPM 80    Minutes 15    METs 2      Biostep-RELP   Level 2    SPM 50    Minutes 15    METs 2      Track   Laps 31    Minutes 15    METs 2.69      Prescription Details    Frequency (times per week) 3    Duration Progress to 30 minutes of continuous aerobic without signs/symptoms of physical distress      Intensity   THRR 40-80% of Max Heartrate 110-132    Ratings of Perceived Exertion 11-13    Perceived Dyspnea 0-4      Progression   Progression Continue to progress workloads to maintain intensity without signs/symptoms of physical distress.      Resistance Training   Training Prescription Yes    Weight 3 lb    Reps 10-15             Perform Capillary Blood Glucose checks as needed.  Exercise Prescription Changes:   Exercise Prescription Changes     Row Name 05/15/22 1400 05/31/22 1600 06/12/22 1500 06/27/22 1400 07/11/22 1500     Response to Exercise   Blood Pressure (Admit) 128/64 124/62 110/78 126/74 122/60   Blood Pressure (Exercise) 162/74 178/74 122/60 178/70 --   Blood Pressure (Exit) 126/62 128/62 104/60 110/60 124/60   Heart Rate (Admit) 89 bpm 88 bpm 72 bpm 86 bpm 92 bpm   Heart Rate (Exercise) 104 bpm 113 bpm 104 bpm 108 bpm 112 bpm   Heart Rate (Exit) 92 bpm 98 bpm 95 bpm 88 bpm 96 bpm   Oxygen Saturation (Admit) 91 % 91 % 92 % 90 % 92 %   Oxygen Saturation (Exercise) 88 % 85 % 88 % 88 % 90 %   Oxygen Saturation (Exit) 94 % 92 % 91 % 93 % 94 %   Rating of Perceived Exertion (Exercise) 14 16 13 13 14    Perceived Dyspnea (Exercise) 2 2 1 1 2    Symptoms SOB SOB SOB SOB SOB   Comments walk test results First two weeks in rehab -- -- --  Duration -- Progress to 30 minutes of  aerobic without signs/symptoms of physical distress Continue with 30 min of aerobic exercise without signs/symptoms of physical distress. Continue with 30 min of aerobic exercise without signs/symptoms of physical distress. Continue with 30 min of aerobic exercise without signs/symptoms of physical distress.   Intensity -- THRR unchanged THRR unchanged THRR unchanged THRR unchanged     Progression   Progression -- Continue to progress workloads to maintain  intensity without signs/symptoms of physical distress. Continue to progress workloads to maintain intensity without signs/symptoms of physical distress. Continue to progress workloads to maintain intensity without signs/symptoms of physical distress. Continue to progress workloads to maintain intensity without signs/symptoms of physical distress.   Average METs -- 2.58 2.61 2.59 2.74     Resistance Training   Training Prescription -- Yes Yes Yes Yes   Weight -- 3 lb 3 lb 4 lb 4 lb   Reps -- 10-15 10-15 10-15 10-15     Interval Training   Interval Training -- No No No No     Treadmill   MPH -- 2 1.5 2 1.8   Grade -- 0.5 0 0 0   Minutes -- 15 15 15 15    METs -- 2.67 2.15 2.53 2.38     Recumbant Bike   Level -- 3 3 3 3    Watts -- 23 23 23 30    Minutes -- 15 15 15 15    METs -- 2.89 2.87 2.88 3.15     NuStep   Level -- 3 -- 3 --   Minutes -- 15 -- 15 --   METs -- 3 -- 2.7 --     REL-XR   Level -- -- 2 -- --   Minutes -- -- 15 -- --   METs -- -- 3.8 -- --     T5 Nustep   Level -- 3 3 3 3    Minutes -- 15 15 15 15    METs -- 2 2 2.1 2.1     Biostep-RELP   Level -- -- -- -- 3   Minutes -- -- -- -- 15   METs -- -- -- -- 3     Track   Laps -- 31 -- 30 30   Minutes -- 15 -- 15 15   METs -- 2.69 -- 2.63 2.63     Oxygen   Maintain Oxygen Saturation -- 88% or higher 88% or higher 88% or higher 88% or higher    Row Name 07/24/22 1500 07/26/22 0800 08/08/22 1500         Response to Exercise   Blood Pressure (Admit) 120/60 -- 128/64     Blood Pressure (Exit) 138/64 -- 102/58     Heart Rate (Admit) 63 bpm -- 95 bpm     Heart Rate (Exercise) 106 bpm -- 100 bpm     Heart Rate (Exit) 88 bpm -- 95 bpm     Oxygen Saturation (Admit) 98 % -- 90 %     Oxygen Saturation (Exercise) 88 % -- 92 %     Oxygen Saturation (Exit) 91 % -- 94 %     Rating of Perceived Exertion (Exercise) 13 -- 11     Perceived Dyspnea (Exercise) 1 -- 1     Symptoms SOB -- SOB     Duration Continue with 30  min of aerobic exercise without signs/symptoms of physical distress. -- Continue with 30 min of aerobic exercise without signs/symptoms of physical distress.  Intensity THRR unchanged -- THRR unchanged       Progression   Progression Continue to progress workloads to maintain intensity without signs/symptoms of physical distress. -- Continue to progress workloads to maintain intensity without signs/symptoms of physical distress.     Average METs 2.71 -- 2.6       Resistance Training   Training Prescription Yes -- Yes     Weight 4 lb -- 4 lb     Reps 10-15 -- 10-15       Interval Training   Interval Training No -- No       Treadmill   MPH 1.8 -- 1.8     Grade 1 -- 0.5     Minutes 15 -- 15     METs 2.63 -- 2.5       Recumbant Bike   Level 3 -- 4     Watts 29 -- --     Minutes 15 -- 15     METs 2.99 -- --       NuStep   Level 5 -- 4     Minutes 15 -- 15     METs 3.6 -- 3.2       T5 Nustep   Level 3 -- 3     Minutes 15 -- 15     METs 2.1 -- 2.1       Biostep-RELP   Level 3 -- --     Minutes 15 -- --     METs 3 -- --       Home Exercise Plan   Plans to continue exercise at -- Home (comment)  walking, join a gym Home (comment)  walking, join a gym     Frequency -- Add 3 additional days to program exercise sessions. Add 3 additional days to program exercise sessions.     Initial Home Exercises Provided -- 07/26/22 07/26/22       Oxygen   Maintain Oxygen Saturation 88% or higher -- 88% or higher              Exercise Comments:   Exercise Goals and Review:   Exercise Goals     Row Name 05/15/22 1443             Exercise Goals   Increase Physical Activity Yes       Intervention Provide advice, education, support and counseling about physical activity/exercise needs.;Develop an individualized exercise prescription for aerobic and resistive training based on initial evaluation findings, risk stratification, comorbidities and participant's personal goals.        Expected Outcomes Short Term: Attend rehab on a regular basis to increase amount of physical activity.;Long Term: Add in home exercise to make exercise part of routine and to increase amount of physical activity.;Long Term: Exercising regularly at least 3-5 days a week.       Increase Strength and Stamina Yes       Intervention Provide advice, education, support and counseling about physical activity/exercise needs.;Develop an individualized exercise prescription for aerobic and resistive training based on initial evaluation findings, risk stratification, comorbidities and participant's personal goals.       Expected Outcomes Short Term: Increase workloads from initial exercise prescription for resistance, speed, and METs.;Short Term: Perform resistance training exercises routinely during rehab and add in resistance training at home;Long Term: Improve cardiorespiratory fitness, muscular endurance and strength as measured by increased METs and functional capacity (6MWT)       Able to understand and use rate of  perceived exertion (RPE) scale Yes       Intervention Provide education and explanation on how to use RPE scale       Expected Outcomes Short Term: Able to use RPE daily in rehab to express subjective intensity level;Long Term:  Able to use RPE to guide intensity level when exercising independently       Able to understand and use Dyspnea scale Yes       Intervention Provide education and explanation on how to use Dyspnea scale       Expected Outcomes Long Term: Able to use Dyspnea scale to guide intensity level when exercising independently;Short Term: Able to use Dyspnea scale daily in rehab to express subjective sense of shortness of breath during exertion       Knowledge and understanding of Target Heart Rate Range (THRR) Yes       Intervention Provide education and explanation of THRR including how the numbers were predicted and where they are located for reference       Expected Outcomes  Short Term: Able to state/look up THRR;Short Term: Able to use daily as guideline for intensity in rehab;Long Term: Able to use THRR to govern intensity when exercising independently       Able to check pulse independently Yes       Intervention Review the importance of being able to check your own pulse for safety during independent exercise;Provide education and demonstration on how to check pulse in carotid and radial arteries.       Expected Outcomes Long Term: Able to check pulse independently and accurately;Short Term: Able to explain why pulse checking is important during independent exercise       Understanding of Exercise Prescription Yes       Intervention Provide education, explanation, and written materials on patient's individual exercise prescription       Expected Outcomes Short Term: Able to explain program exercise prescription;Long Term: Able to explain home exercise prescription to exercise independently                Exercise Goals Re-Evaluation :  Exercise Goals Re-Evaluation     [place] Name 05/31/22 1626 06/12/22 1600 06/27/22 1446 07/11/22 1520 07/24/22 0831     Exercise Goal Re-Evaluation   Exercise Goals Review Increase Physical Activity;Increase Strength and Stamina;Understanding of Exercise Prescription Increase Physical Activity;Increase Strength and Stamina;Understanding of Exercise Prescription Increase Physical Activity;Increase Strength and Stamina;Understanding of Exercise Prescription Increase Physical Activity;Increase Strength and Stamina;Understanding of Exercise Prescription Increase Physical Activity;Increase Strength and Stamina;Understanding of Exercise Prescription   Comments [place] is off to a good start in rehab. He had an overall average MET level of 2.58 METs during his first two weeks attending rehab. He also was able to follow his exercise prescription for the treadmill at a speed of 2 mph and an incline of 0.5%. He was able to increase his  workloads to level 3 on both the T5 and the recumbent bike as well. We will continue to monitor his progress. Bryan Mcclain continues to do well in rehab. He tried out the XR and was able to do level 2. He has stayed consistant working at 23 watts on the recumbent bike and we hope to see that improve. We would also recommend putting that incline back on the treadmill workload. RPEs are staying appropriate and oxygen saturations are staying above 88%. Will continue to monitor. Bryan Mcclain is doing well in rehab. He has stayed consistent with his workloads on the seated machines, but  could benefit from beginning to increase. He also has been inconsistent with his speed on the treadmill, but has worked his way back up to 2 mph. He did increase from 3 lb to 4 lb hand weights for resistance training. We will continue to monitor his progress in the program, Bryan Mcclain continues to do well in rehab. He worked up to 30 watts on the recumbant bike which is the highest for him yet! He also increased to level 3 on the Biostep. He has been consistently walking 30 laps on the track and will encouraged patient to increase his laps on the track beyond that. Will continue to monitor. Bryan Mcclain improved his post walk by 23%!!  He is already making plans to join [employer] after her graduates to keep up with his exercise.  He says that his stamina has greatly improved and he is feeling much better overall.   Expected Outcomes Short: Continue to follow exercise prescription. Long: Continue to attend pulmonary rehab regularly. Short: Add in incline back to treadmill, increase watts on recumbent bike Long: Continue to increase overall MET level Short: Continue to increase workload on the treadmill, begin to increase workloads on seated machines. Long: Continue to improve strength and stamina. Short: Increase laps on track Long: Continue to increase overall MET level and stamina Short: continue to work toward graduation Long: continue to exercise  independently    [place] Name 07/24/22 1515 07/26/22 0806 08/08/22 1519         Exercise Goal Re-Evaluation   Exercise Goals Review Increase Physical Activity;Increase Strength and Stamina;Understanding of Exercise Prescription Increase Physical Activity;Increase Strength and Stamina;Understanding of Exercise Prescription;Able to understand and use rate of perceived exertion (RPE) scale;Able to understand and use Dyspnea scale;Knowledge and understanding of Target Heart Rate Range (THRR);Able to check pulse independently Increase Physical Activity;Increase Strength and Stamina;Understanding of Exercise Prescription     Comments Bryan Mcclain continues to do well in rehab. He recently completed his post 6MWT and improved by 23%. He also added incline to his treadmill workload at 1%, while maintaining a speed of 1.8 mph. He improved to level 5 on the T4 nustep as well. We will continue to monitor his progress in the program. Reviewed home exercise with pt today.  Pt plans to walking and joining a gym for exercise.  Reviewed THR, pulse, RPE, sign and symptoms, pulse oximetery and when to call 911 or MD.  Also discussed weather considerations and indoor options.  Pt voiced understanding. Bryan Mcclain is doing well in rehab.  He is up to level 4 on the recumbent bike and has added 0.5% grade to his treadmill.  We will continue to monitor his progress.     Expected Outcomes Short: Continue to work toward graduation. Long: Continue to exercise independently. Short: Continue to walk on off days Long: Join a gym Short: Continue to work towards graduation Long: Continue to exercise independently              Discharge Exercise Prescription (Final Exercise Prescription Changes):  Exercise Prescription Changes - 08/08/22 1500       Response to Exercise   Blood Pressure (Admit) 128/64    Blood Pressure (Exit) 102/58    Heart Rate (Admit) 95 bpm    Heart Rate (Exercise) 100 bpm    Heart Rate (Exit) 95 bpm    Oxygen  Saturation (Admit) 90 %    Oxygen Saturation (Exercise) 92 %    Oxygen Saturation (Exit) 94 %    Rating of  Perceived Exertion (Exercise) 11    Perceived Dyspnea (Exercise) 1    Symptoms SOB    Duration Continue with 30 min of aerobic exercise without signs/symptoms of physical distress.    Intensity THRR unchanged      Progression   Progression Continue to progress workloads to maintain intensity without signs/symptoms of physical distress.    Average METs 2.6      Resistance Training   Training Prescription Yes    Weight 4 lb    Reps 10-15      Interval Training   Interval Training No      Treadmill   MPH 1.8    Grade 0.5    Minutes 15    METs 2.5      Recumbant Bike   Level 4    Minutes 15      NuStep   Level 4    Minutes 15    METs 3.2      T5 Nustep   Level 3    Minutes 15    METs 2.1      Home Exercise Plan   Plans to continue exercise at Home (comment)   walking, join a gym   Frequency Add 3 additional days to program exercise sessions.    Initial Home Exercises Provided 07/26/22      Oxygen   Maintain Oxygen Saturation 88% or higher             Nutrition:  Target Goals: Understanding of nutrition guidelines, daily intake of sodium 1500mg , cholesterol 200mg , calories 30% from fat and 7% or less from saturated fats, daily to have 5 or more servings of fruits and vegetables.  Education: All About Nutrition: -Group instruction provided by verbal, written material, interactive activities, discussions, models, and posters to present general guidelines for heart healthy nutrition including fat, fiber, MyPlate, the role of sodium in heart healthy nutrition, utilization of the nutrition label, and utilization of this knowledge for meal planning. Follow up email sent as well. Written material given at graduation.   Biometrics:  Pre Biometrics - 05/15/22 1444       Pre Biometrics   Height 5' 11.4" (1.814 m)    Weight 176 lb 14.4 oz (80.2 kg)    Waist  Circumference 36.25 inches    Hip Circumference 37.5 inches    Waist to Hip Ratio 0.97 %    BMI (Calculated) 24.38    Single Leg Stand 21.5 seconds             Post Biometrics - 07/24/22 0820        Post  Biometrics   Height 5' 11.4" (1.814 m)    Weight 182 lb 9.6 oz (82.8 kg)    Waist Circumference 39 inches    Hip Circumference 38.5 inches    Waist to Hip Ratio 1.01 %    BMI (Calculated) 25.17    Single Leg Stand 30 seconds             Nutrition Therapy Plan and Nutrition Goals:  Nutrition Therapy & Goals - 05/15/22 0920       Nutrition Therapy   RD appointment deferred Yes   Pt would not like to meet with RD at this time, will continue to follow up.     Personal Nutrition Goals   Nutrition Goal Pt would not like to meet with RD at this time, will continue to follow up.      Intervention Plan   Intervention Prescribe, educate and  counsel regarding individualized specific dietary modifications aiming towards targeted core components such as weight, hypertension, lipid management, diabetes, heart failure and other comorbidities.;Nutrition handout(s) given to patient.    Expected Outcomes Short Term Goal: Understand basic principles of dietary content, such as calories, fat, sodium, cholesterol and nutrients.;Short Term Goal: A plan has been developed with personal nutrition goals set during dietitian appointment.;Long Term Goal: Adherence to prescribed nutrition plan.             Nutrition Assessments:  MEDIFICTS Score Key: ?70 Need to make dietary changes  40-70 Heart Healthy Diet ? 40 Therapeutic Level Cholesterol Diet  Flowsheet Row Pulmonary Rehab from 07/28/2022 in [facility] Cardiac and Pulmonary Rehab  Picture Your Plate Total Score on Discharge 60      Picture Your Plate Scores: [id number] Unhealthy dietary pattern with much room for improvement. 41-50 Dietary pattern unlikely to meet recommendations for good health and room for improvement. 51-60 More  healthful dietary pattern, with some room for improvement.  >60 Healthy dietary pattern, although there may be some specific behaviors that could be improved.   Nutrition Goals Re-Evaluation:  Nutrition Goals Re-Evaluation     [place] Name 06/07/22 0801 07/07/22 0802 07/24/22 [id number]         Goals   Current Weight 181 lb (82.1 kg) 181 lb (82.1 kg) --     Nutrition Goal -- Patient would not like to meet with RD at this time, will continue to follow up. Patient would not like to meet with RD at this time, will continue to follow up.     Comment Patient was informed on why it is important to maintain a balanced diet when dealing with Respiratory issues. Explained that it takes a lot of energy to breath and when they are short of breath often they will need to have a good diet to help keep up with the calories they are expending for breathing. Bryan Mcclain has no questions about his eating habits and is doing well. Bryan Mcclain feels that he is doing well with his diet.  He has made some tweaks and feels confident that he can stick with them.     Expected Outcome Short: Choose and plan snacks accordingly to patients caloric intake to improve breathing. Long: Maintain a diet independently that meets their caloric intake to aid in daily shortness of breath. Short: continue to maintain diet. Long: eat a healthy diet that pertains to him. Continue to follow healthy eating plan.              Nutrition Goals Discharge (Final Nutrition Goals Re-Evaluation):  Nutrition Goals Re-Evaluation - 07/24/22 [id number]       Goals   Nutrition Goal Patient would not like to meet with RD at this time, will continue to follow up.    Comment Bryan Mcclain feels that he is doing well with his diet.  He has made some tweaks and feels confident that he can stick with them.    Expected Outcome Continue to follow healthy eating plan.             Psychosocial: Target Goals: Acknowledge presence or absence of significant depression and/or stress,  maximize coping skills, provide positive support system. Participant is able to verbalize types and ability to use techniques and skills needed for reducing stress and depression.   Education: Stress, Anxiety, and Depression - Group verbal and visual presentation to define topics covered.  Reviews how body is impacted by stress, anxiety, and depression.  Also discusses healthy  ways to reduce stress and to treat/manage anxiety and depression.  Written material given at graduation. Flowsheet Row Pulmonary Rehab from 08/09/2022 in [facility] Cardiac and Pulmonary Rehab  Date 08/09/22  Educator [facility]  Instruction Review Code 1- [employer] Understanding       Education: Sleep Hygiene -Provides group verbal and written instruction about how sleep can affect your health.  Define sleep hygiene, discuss sleep cycles and impact of sleep habits. Review good sleep hygiene tips.    Initial Review & Psychosocial Screening:  Initial Psych Review & Screening - 05/11/22 1013       Initial Review   Current issues with None Identified      Family Dynamics   Good Support System? Yes    Comments Bryan Mcclain can look to his wife and 4 boys for support. He states no stressors at the moment and wants to get his breathing better.      Barriers   Psychosocial barriers to participate in program There are no identifiable barriers or psychosocial needs.;The patient should benefit from training in stress management and relaxation.      Screening Interventions   Interventions Encouraged to exercise;To provide support and resources with identified psychosocial needs;Provide feedback about the scores to participant    Expected Outcomes Short Term goal: Utilizing psychosocial counselor, staff and physician to assist with identification of specific Stressors or current issues interfering with healing process. Setting desired goal for each stressor or current issue identified.;Long Term Goal: Stressors or current issues are controlled  or eliminated.;Short Term goal: Identification and review with participant of any Quality of Life or Depression concerns found by scoring the questionnaire.;Long Term goal: The participant improves quality of Life and PHQ9 Scores as seen by post scores and/or verbalization of changes             Quality of Life Scores:  Scores of 19 and below usually indicate a poorer quality of life in these areas.  A difference of  2-3 points is a clinically meaningful difference.  A difference of 2-3 points in the total score of the Quality of Life Index has been associated with significant improvement in overall quality of life, self-image, physical symptoms, and general health in studies assessing change in quality of life.  PHQ-9: Review Flowsheet       07/28/2022 06/07/2022 05/15/2022  Depression screen PHQ 2/9  Decreased Interest 1 0 0  Down, Depressed, Hopeless 0 0 0  PHQ - 2 Score 1 0 0  Altered sleeping 0 1 0  Tired, decreased energy 2 3 2   Change in appetite 1 0 1  Feeling bad or failure about yourself  0 0 1  Trouble concentrating 2 0 1  Moving slowly or fidgety/restless 1 0 0  Suicidal thoughts 0 0 0  PHQ-9 Score 7 4 5   Difficult doing work/chores Not difficult at all Not difficult at all Somewhat difficult   Interpretation of Total Score  Total Score Depression Severity:  1-4 = Minimal depression, 5-9 = Mild depression, 10-14 = Moderate depression, 15-19 = Moderately severe depression, 20-27 = Severe depression   Psychosocial Evaluation and Intervention:  Psychosocial Evaluation - 05/11/22 1014       Psychosocial Evaluation & Interventions   Interventions Relaxation education;Encouraged to exercise with the program and follow exercise prescription;Stress management education    Comments Bryan Mcclain can look to his wife and 4 boys for support. He states no stressors at the moment and wants to get his breathing better.  Expected Outcomes Short: Start [facility] to help with mood. Long:  Maintain a healthy mental state.    Continue Psychosocial Services  Follow up required by staff             Psychosocial Re-Evaluation:  Psychosocial Re-Evaluation     [place] Name 06/07/22 0805 07/07/22 0803 07/24/22 [id number]         Psychosocial Re-Evaluation   Current issues with None Identified None Identified None Identified     Comments Reviewed patient health questionnaire (PHQ-9) with patient for follow up. Previously, patients score indicated signs/symptoms of depression.  Reviewed to see if patient is improving symptom wise while in program.  Score improved and patient states that it is because he has been more routine with working out. Patient reports no issues with their current mental states, sleep, stress, depression or anxiety. Will follow up with patient in a few weeks for any changes. Bryan Mcclain has done well in rehab.  He improved his post walk and is breathing a lot better!  He has enjoyed coming to classes and getting moving more as well as breathing better.  He says that he learned a lot in the education classes.     Expected Outcomes Short: Continue to attend [facility] regularly for regular exercise and social engagement. Long: Continue to improve symptoms and manage a positive mental state. Short: Continue to exercise regularly to support mental health and notify staff of any changes. Long: maintain mental health and well being through teaching of rehab or prescribed medications independently. Short; Continue to exercise for mental boost Long; Continue to stay active and positive     Interventions Encouraged to attend Pulmonary Rehabilitation for the exercise Encouraged to attend Pulmonary Rehabilitation for the exercise Encouraged to attend Pulmonary Rehabilitation for the exercise     Continue Psychosocial Services  No Follow up required Follow up required by staff --              Psychosocial Discharge (Final Psychosocial Re-Evaluation):  Psychosocial Re-Evaluation -  07/24/22 0837       Psychosocial Re-Evaluation   Current issues with None Identified    Comments Bryan Mcclain has done well in rehab.  He improved his post walk and is breathing a lot better!  He has enjoyed coming to classes and getting moving more as well as breathing better.  He says that he learned a lot in the education classes.    Expected Outcomes Short; Continue to exercise for mental boost Long; Continue to stay active and positive    Interventions Encouraged to attend Pulmonary Rehabilitation for the exercise             Education: Education Goals: Education classes will be provided on a weekly basis, covering required topics. Participant will state understanding/return demonstration of topics presented.  Learning Barriers/Preferences:  Learning Barriers/Preferences - 05/11/22 1012       Learning Barriers/Preferences   Learning Barriers None    Learning Preferences None             General Pulmonary Education Topics:  Infection Prevention: - Provides verbal and written material to individual with discussion of infection control including proper hand washing and proper equipment cleaning during exercise session. Flowsheet Row Pulmonary Rehab from 08/09/2022 in [facility] Cardiac and Pulmonary Rehab  Date 05/11/22  Educator [facility]  Instruction Review Code 1- Verbalizes Understanding       Falls Prevention: - Provides verbal and written material to individual with discussion of falls prevention and safety. Flowsheet Row  Pulmonary Rehab from 08/09/2022 in [facility] Cardiac and Pulmonary Rehab  Date 05/11/22  Educator [facility]  Instruction Review Code 1- Verbalizes Understanding       Chronic Lung Disease Review: - Group verbal instruction with posters, models, PowerPoint presentations and videos,  to review new updates, new respiratory medications, new advancements in procedures and treatments. Providing information on websites and "800" numbers for continued self-education. Includes  information about supplement oxygen, available portable oxygen systems, continuous and intermittent flow rates, oxygen safety, concentrators, and Medicare reimbursement for oxygen. Explanation of Pulmonary Drugs, including class, frequency, complications, importance of spacers, rinsing mouth after steroid MDI's, and proper cleaning methods for nebulizers. Review of basic lung anatomy and physiology related to function, structure, and complications of lung disease. Review of risk factors. Discussion about methods for diagnosing sleep apnea and types of masks and machines for OSA. Includes a review of the use of types of environmental controls: home humidity, furnaces, filters, dust mite/pet prevention, HEPA vacuums. Discussion about weather changes, air quality and the benefits of nasal washing. Instruction on Warning signs, infection symptoms, calling MD promptly, preventive modes, and value of vaccinations. Review of effective airway clearance, coughing and/or vibration techniques. Emphasizing that all should Create an Action Plan. Written material given at graduation. Flowsheet Row Pulmonary Rehab from 08/09/2022 in [facility] Cardiac and Pulmonary Rehab  Education need identified 05/15/22  Date 08/02/22  Educator [facility]  Instruction Review Code 1- Verbalizes Understanding       AED/CPR: - Group verbal and written instruction with the use of models to demonstrate the basic use of the AED with the basic ABC's of resuscitation.    Anatomy and Cardiac Procedures: - Group verbal and visual presentation and models provide information about basic cardiac anatomy and function. Reviews the testing methods done to diagnose heart disease and the outcomes of the test results. Describes the treatment choices: Medical Management, Angioplasty, or Coronary Bypass Surgery for treating various heart conditions including Myocardial Infarction, Angina, Valve Disease, and Cardiac Arrhythmias.  Written material given at  graduation. Flowsheet Row Pulmonary Rehab from 08/09/2022 in [facility] Cardiac and Pulmonary Rehab  Date 07/05/22  Educator SB  Instruction Review Code 1- Verbalizes Understanding       Medication Safety: - Group verbal and visual instruction to review commonly prescribed medications for heart and lung disease. Reviews the medication, class of the drug, and side effects. Includes the steps to properly store meds and maintain the prescription regimen.  Written material given at graduation. Flowsheet Row Pulmonary Rehab from 08/09/2022 in [facility] Cardiac and Pulmonary Rehab  Date 07/19/22  Educator MS  Instruction Review Code 1- Verbalizes Understanding       Other: -Provides group and verbal instruction on various topics (see comments) Flowsheet Row Pulmonary Rehab from 08/09/2022 in [facility] Cardiac and Pulmonary Rehab  Date 07/12/22  Educator SB  Instruction Review Code 1- Verbalizes Understanding  [Jeopardy]       Knowledge Questionnaire Score:  Knowledge Questionnaire Score - 07/28/22 0824       Knowledge Questionnaire Score   Pre Score 15/18    Post Score 16/18              Core Components/Risk Factors/Patient Goals at Admission:  Personal Goals and Risk Factors at Admission - 05/15/22 1444       Core Components/Risk Factors/Patient Goals on Admission    Weight Management Yes;Weight Maintenance    Intervention Weight Management: Develop a combined nutrition and exercise program designed to reach desired caloric intake,  while maintaining appropriate intake of nutrient and fiber, sodium and fats, and appropriate energy expenditure required for the weight goal.;Weight Management: Provide education and appropriate resources to help participant work on and attain dietary goals.;Weight Management/Obesity: Establish reasonable short term and long term weight goals.    Admit Weight 176 lb 14.4 oz (80.2 kg)    Goal Weight: Short Term 176 lb (79.8 kg)    Goal Weight: Long Term 175 lb  (79.4 kg)    Expected Outcomes Short Term: Continue to assess and modify interventions until short term weight is achieved;Long Term: Adherence to nutrition and physical activity/exercise program aimed toward attainment of established weight goal;Weight Maintenance: Understanding of the daily nutrition guidelines, which includes 25-35% calories from fat, 7% or less cal from saturated fats, less than 200mg  cholesterol, less than 1.5gm of sodium, & 5 or more servings of fruits and vegetables daily    Improve shortness of breath with ADL's Yes    Intervention Provide education, individualized exercise plan and daily activity instruction to help decrease symptoms of SOB with activities of daily living.    Expected Outcomes Short Term: Improve cardiorespiratory fitness to achieve a reduction of symptoms when performing ADLs;Long Term: Be able to perform more ADLs without symptoms or delay the onset of symptoms    Increase knowledge of respiratory medications and ability to use respiratory devices properly  Yes    Intervention Provide education and demonstration as needed of appropriate use of medications, inhalers, and oxygen therapy.    Expected Outcomes Short Term: Achieves understanding of medications use. Understands that oxygen is a medication prescribed by physician. Demonstrates appropriate use of inhaler and oxygen therapy.;Long Term: Maintain appropriate use of medications, inhalers, and oxygen therapy.    Hypertension Yes    Intervention Provide education on lifestyle modifcations including regular physical activity/exercise, weight management, moderate sodium restriction and increased consumption of fresh fruit, vegetables, and low fat dairy, alcohol moderation, and smoking cessation.;Monitor prescription use compliance.    Expected Outcomes Short Term: Continued assessment and intervention until BP is < 140/98mm HG in hypertensive participants. < 130/55mm HG in hypertensive participants with  diabetes, heart failure or chronic kidney disease.;Long Term: Maintenance of blood pressure at goal levels.    Lipids Yes    Intervention Provide education and support for participant on nutrition & aerobic/resistive exercise along with prescribed medications to achieve LDL 70mg , HDL >40mg .    Expected Outcomes Short Term: Participant states understanding of desired cholesterol values and is compliant with medications prescribed. Participant is following exercise prescription and nutrition guidelines.;Long Term: Cholesterol controlled with medications as prescribed, with individualized exercise RX and with personalized nutrition plan. Value goals: LDL < 70mg , HDL > 40 mg.             Education:Diabetes - Individual verbal and written instruction to review signs/symptoms of diabetes, desired ranges of glucose level fasting, after meals and with exercise. Acknowledge that pre and post exercise glucose checks will be done for 3 sessions at entry of program.   Know Your Numbers and Heart Failure: - Group verbal and visual instruction to discuss disease risk factors for cardiac and pulmonary disease and treatment options.  Reviews associated critical values for Overweight/Obesity, Hypertension, Cholesterol, and Diabetes.  Discusses basics of heart failure: signs/symptoms and treatments.  Introduces Heart Failure Zone chart for action plan for heart failure.  Written material given at graduation. Flowsheet Row Pulmonary Rehab from 08/09/2022 in [facility] Cardiac and Pulmonary Rehab  Date 05/17/22  Educator SB  Instruction Review Code 1- Verbalizes Understanding       Core Components/Risk Factors/Patient Goals Review:   Goals and Risk Factor Review     Row Name 06/07/22 0802 07/07/22 0804 07/24/22 0839         Core Components/Risk Factors/Patient Goals Review   Personal Goals Review Improve shortness of breath with ADL's Improve shortness of breath with ADL's Improve shortness of breath with  ADL's;Increase knowledge of respiratory medications and ability to use respiratory devices properly.;Weight Management/Obesity;Hypertension     Review Spoke to patient about their shortness of breath and what they can do to improve. Patient has been informed of breathing techniques when starting the program. Patient is informed to tell staff if they have had any med changes and that certain meds they are taking or not taking can be causing shortness of breath. Bryan Mcclain feels like he has improved with his ADLs. He sometimes gets winded but knows how to PLB to help him recover. He is taken Trellegy, rescue inhaler and Albuterol Nebulizer. He has no questions about his medications and takes them properly. Bryan Mcclain has done well in rehab.  He is feeling better overall and breathing better than when he started.  He is compliant with his medications.  His weight has been steady and his pressures are doing well.     Expected Outcomes Short: Attend [facility] regularly to improve shortness of breath with ADL's. Long: maintain independence with ADL's Short: take medications as prescribed. Long: maintain medications independently. Continue to montior risk factors              Core Components/Risk Factors/Patient Goals at Discharge (Final Review):   Goals and Risk Factor Review - 07/24/22 0839       Core Components/Risk Factors/Patient Goals Review   Personal Goals Review Improve shortness of breath with ADL's;Increase knowledge of respiratory medications and ability to use respiratory devices properly.;Weight Management/Obesity;Hypertension    Review Bryan Mcclain has done well in rehab.  He is feeling better overall and breathing better than when he started.  He is compliant with his medications.  His weight has been steady and his pressures are doing well.    Expected Outcomes Continue to montior risk factors             ITP Comments:  ITP Comments     Row Name 05/11/22 1010 05/15/22 1430 05/24/22 1123 06/21/22  1057 07/19/22 1519   ITP Comments Virtual Visit completed. Patient informed on EP and RD appointment and 6 Minute walk test. Patient also informed of patient health questionnaires on My Chart. Patient Verbalizes understanding. Visit diagnosis can be found in [facility] 05/08/2022. Completed 6MWT and gym orientation. Initial ITP created and sent for review to Dr. Zetta Bills, Medical Director. 30 Day review completed. Medical Director ITP review done, changes made as directed, and signed approval by Medical Director.    new to program 30 Day review completed. Medical Director ITP review done, changes made as directed, and signed approval by Medical Director. 30 day review completed. ITP sent to Dr. Zetta Bills, Medical Director of  Pulmonary Rehab. Continue with ITP unless changes are made by physician.    Row Name 08/14/22 0832           ITP Comments Alie graduated today from  rehab with 36 sessions completed.  Details of the patient's exercise prescription and what He needs to do in order to continue the prescription and progress were discussed with patient.  Patient was given a copy of  prescription and goals.  Patient verbalized understanding.  Jajuan plans to continue to exercise by walking and plans to join a gym.                Comments: Discharge ITP

## 2022-08-14 NOTE — Progress Notes (Signed)
Daily Session Note  Patient Details  Name: Bryan Mcclain MRN: [id number] Date of Birth: [birth year] Referring Provider:   April Manson Pulmonary Rehab from 05/15/2022 in [facility] Cardiac and Pulmonary Rehab  Referring Provider Emily Filbert MD       Encounter Date: 08/14/2022  Check In:  Session Check In - 08/14/22 0830       Check-In   Supervising physician immediately available to respond to emergencies See telemetry face sheet for immediately available ER MD    Location [facility]-Cardiac & Pulmonary Rehab    Staff Present Darlyne Russian, RN, ADN;Joseph Tessie Fass, RCP,RRT,BSRT;Kelly Amedeo Plenty, BS, ACSM CEP, Exercise Physiologist    Virtual Visit No    Medication changes reported     No    Fall or balance concerns reported    No    Warm-up and Cool-down Performed on first and last piece of equipment    Resistance Training Performed Yes    VAD Patient? No    PAD/SET Patient? No      Pain Assessment   Currently in Pain? No/denies                Social History   Tobacco Use  Smoking Status Former   Packs/day: 1.00   Years: 42.00   Additional pack years: 0.00   Total pack years: 42.00   Types: Cigarettes   Quit date: 11/01/2021   Years since quitting: 0.7  Smokeless Tobacco Never    Goals Met:  Independence with exercise equipment Exercise tolerated well No report of concerns or symptoms today Strength training completed today  Goals Unmet:  Not Applicable  Comments:  Marissa graduated today from  rehab with 36 sessions completed.  Details of the patient's exercise prescription and what He needs to do in order to continue the prescription and progress were discussed with patient.  Patient was given a copy of prescription and goals.  Patient verbalized understanding.  Angel plans to continue to exercise by walking and plans to join a gym.    Dr. Emily Filbert is Medical Director for [place].  Dr. Ottie Glazier is Medical Director for [facility]  Pulmonary Rehabilitation.

## 2022-10-16 ENCOUNTER — Other Ambulatory Visit: Payer: Self-pay | Admitting: Internal Medicine

## 2022-10-16 DIAGNOSIS — R911 Solitary pulmonary nodule: Secondary | ICD-10-CM

## 2022-10-16 DIAGNOSIS — Z0000 Encounter for general adult medical examination without abnormal findings: Secondary | ICD-10-CM

## 2022-10-18 ENCOUNTER — Ambulatory Visit
Admission: RE | Admit: 2022-10-18 | Discharge: 2022-10-18 | Disposition: A | Discharge status: Home / Self Care | Payer: Medicare PPO | Source: Ambulatory Visit | Attending: Internal Medicine | Admitting: Internal Medicine

## 2022-10-18 DIAGNOSIS — R911 Solitary pulmonary nodule: Secondary | ICD-10-CM | POA: Diagnosis present

## 2022-10-18 DIAGNOSIS — Z0000 Encounter for general adult medical examination without abnormal findings: Secondary | ICD-10-CM | POA: Diagnosis present

## 2022-10-22 ENCOUNTER — Emergency Department: Payer: Medicare PPO

## 2022-10-22 ENCOUNTER — Other Ambulatory Visit: Payer: Self-pay

## 2022-10-22 ENCOUNTER — Observation Stay
Admission: EM | Admit: 2022-10-22 | Discharge: 2022-10-23 | Disposition: A | Discharge status: Home / Self Care | Payer: Medicare PPO | Attending: Student | Admitting: Student

## 2022-10-22 DIAGNOSIS — N1831 Chronic kidney disease, stage 3a: Secondary | ICD-10-CM | POA: Diagnosis present

## 2022-10-22 DIAGNOSIS — J9611 Chronic respiratory failure with hypoxia: Secondary | ICD-10-CM

## 2022-10-22 DIAGNOSIS — I129 Hypertensive chronic kidney disease with stage 1 through stage 4 chronic kidney disease, or unspecified chronic kidney disease: Secondary | ICD-10-CM | POA: Diagnosis not present

## 2022-10-22 DIAGNOSIS — R0602 Shortness of breath: Secondary | ICD-10-CM | POA: Diagnosis present

## 2022-10-22 DIAGNOSIS — Z79899 Other long term (current) drug therapy: Secondary | ICD-10-CM | POA: Diagnosis not present

## 2022-10-22 DIAGNOSIS — R0902 Hypoxemia: Secondary | ICD-10-CM

## 2022-10-22 DIAGNOSIS — J441 Chronic obstructive pulmonary disease with (acute) exacerbation: Principal | ICD-10-CM | POA: Diagnosis present

## 2022-10-22 DIAGNOSIS — Z87891 Personal history of nicotine dependence: Secondary | ICD-10-CM | POA: Insufficient documentation

## 2022-10-22 DIAGNOSIS — Z7982 Long term (current) use of aspirin: Secondary | ICD-10-CM | POA: Diagnosis not present

## 2022-10-22 DIAGNOSIS — I10 Essential (primary) hypertension: Secondary | ICD-10-CM | POA: Diagnosis present

## 2022-10-22 DIAGNOSIS — E785 Hyperlipidemia, unspecified: Secondary | ICD-10-CM | POA: Diagnosis present

## 2022-10-22 LAB — CBC WITH DIFFERENTIAL/PLATELET
Abs Immature Granulocytes: 0.12 10*3/uL — ABNORMAL HIGH (ref 0.00–0.07)
Basophils Absolute: 0.1 10*3/uL (ref 0.0–0.1)
Basophils Relative: 0 %
Eosinophils Absolute: 0 10*3/uL (ref 0.0–0.5)
Eosinophils Relative: 0 %
HCT: 44.1 % (ref 39.0–52.0)
Hemoglobin: 14.5 g/dL (ref 13.0–17.0)
Immature Granulocytes: 1 %
Lymphocytes Relative: 7 %
Lymphs Abs: 1.4 10*3/uL (ref 0.7–4.0)
MCH: 29.1 pg (ref 26.0–34.0)
MCHC: 32.9 g/dL (ref 30.0–36.0)
MCV: 88.6 fL (ref 80.0–100.0)
Monocytes Absolute: 1.7 10*3/uL — ABNORMAL HIGH (ref 0.1–1.0)
Monocytes Relative: 9 %
Neutro Abs: 16.5 10*3/uL — ABNORMAL HIGH (ref 1.7–7.7)
Neutrophils Relative %: 83 %
Platelets: 216 10*3/uL (ref 150–400)
RBC: 4.98 MIL/uL (ref 4.22–5.81)
RDW: 16 % — ABNORMAL HIGH (ref 11.5–15.5)
WBC: 19.8 10*3/uL — ABNORMAL HIGH (ref 4.0–10.5)
nRBC: 0 % (ref 0.0–0.2)

## 2022-10-22 MED ORDER — METHYLPREDNISOLONE SODIUM SUCC 40 MG IJ SOLR
40.0000 mg | Freq: Once | INTRAMUSCULAR | Status: AC
  Administered 2022-10-22: 40 mg via INTRAVENOUS
  Filled 2022-10-22: qty 1

## 2022-10-22 MED ORDER — IPRATROPIUM-ALBUTEROL 0.5-2.5 (3) MG/3ML IN SOLN
6.0000 mL | Freq: Once | RESPIRATORY_TRACT | Status: AC
  Administered 2022-10-22: 6 mL via RESPIRATORY_TRACT
  Filled 2022-10-22: qty 3

## 2022-10-22 MED ORDER — IPRATROPIUM-ALBUTEROL 0.5-2.5 (3) MG/3ML IN SOLN
6.0000 mL | Freq: Once | RESPIRATORY_TRACT | Status: AC
  Administered 2022-10-23: 6 mL via RESPIRATORY_TRACT
  Filled 2022-10-22: qty 3

## 2022-10-22 NOTE — ED Triage Notes (Addendum)
Arrives POV with spouse with shortness of breath ongoing since this past Thursday. Seen PCP and was given Rx for prednisone and some azithromycin.   Hx COPD. Only wears supplemental o2 as needed at night time.   Nonproductive cough today and noticed oxygen was 87 on room air with hr>120. Arrives wearing 2-3L via nasal cannula.

## 2022-10-22 NOTE — ED Provider Notes (Signed)
[facility] Provider Note    Event Date/Time   First MD Initiated Contact with Patient 10/22/22 2254     (approximate)   History   Shortness of Breath   HPI  Bryan Mcclain is a 77 y.o. male who presents to the ED for evaluation of Shortness of Breath   I reviewed PCP visit from 5/16, 10 days ago.  History of oxygen dependent COPD, DM and seen for COPD exacerbation.  Provided steroids and started steroid taper and Z-Pak.  Patient presents to the ED alongside his wife for the evaluation of worsening cough and shortness of breath despite compliance with the above medications.  Reports having 1 day left on his steroids and finished the Z-Pak, but despite this he has had worsening cough and dyspnea.  Poor appetite and generalized malaise but no fevers.  Nausea without emesis or diarrhea, no abdominal pain.  Also reports not taking his prescribed Lasix for the past few days, 3 to 5 days, because he felt like he did not need to.  Acknowledges he has some mild edema now  Physical Exam   Triage Vital Signs: ED Triage Vitals  Enc Vitals Group     BP 10/22/22 2257 (!) 165/78     Pulse Rate 10/22/22 2257 76     Resp 10/22/22 2257 17     Temp 10/22/22 2257 98.8 F (37.1 C)     Temp Source 10/22/22 2257 Oral     SpO2 10/22/22 2257 96 %     Weight 10/22/22 2250 190 lb (86.2 kg)     Height 10/22/22 2250 6' (1.829 m)     Head Circumference --      Peak Flow --      Pain Score 10/22/22 2250 0     Pain Loc --      Pain Edu? --      Excl. in GC? --     Most recent vital signs: Vitals:   10/23/22 0250 10/23/22 0253  BP: 134/63   Pulse: 93   Resp: 11   Temp:  98.9 F (37.2 C)  SpO2: 93%     General: Awake.  Pleasant and making jokes, but with pursed lip breathing. CV:  Good peripheral perfusion.  Resp:  Mild tachypnea to the mid 20s.  Poor airflow with diffuse wheezing Abd:  No distention.  MSK:  No deformity noted.  Mild pitting edema bilaterally lower  extremities Neuro:  No focal deficits appreciated. Other:     ED Results / Procedures / Treatments   Labs (all labs ordered are listed, but only abnormal results are displayed) Labs Reviewed  COMPREHENSIVE METABOLIC PANEL - Abnormal; Notable for the following components:      Result Value   Glucose, Bld 114 (*)    BUN 41 (*)    Creatinine, Ser 1.56 (*)    GFR, Estimated 45 (*)    Anion gap 4 (*)    All other components within normal limits  CBC WITH DIFFERENTIAL/PLATELET - Abnormal; Notable for the following components:   WBC 19.8 (*)    RDW 16.0 (*)    Neutro Abs 16.5 (*)    Monocytes Absolute 1.7 (*)    Abs Immature Granulocytes 0.12 (*)    All other components within normal limits  BRAIN NATRIURETIC PEPTIDE  MAGNESIUM  TROPONIN I (HIGH SENSITIVITY)  TROPONIN I (HIGH SENSITIVITY)    EKG Sinus rhythm with a rate of 83 bpm.  Treatment is baseline.  Normal  axis and intervals.  No clear STEMI  RADIOLOGY CXR interpreted by me without evidence of acute cardiopulmonary pathology.  Official radiology report(s): DG Chest 2 View  Result Date: 10/22/2022 CLINICAL DATA:  Shortness of breath. EXAM: CHEST - 2 VIEW COMPARISON:  April 13, 2022 FINDINGS: The heart size and mediastinal contours are within normal limits. The lungs are hyperinflated with flattening of both diaphragms. Mild stable left costophrenic angle blunting is noted. There is no evidence of acute infiltrate, pleural effusion or pneumothorax. Chronic left-sided rib fractures are seen. Multilevel degenerative changes seen throughout the thoracic spine. IMPRESSION: Findings likely consistent with COPD, without acute cardiopulmonary disease. Electronically Signed   By: Aram Candela M.D.   On: 10/22/2022 23:44    PROCEDURES and INTERVENTIONS:  .1-3 Lead EKG Interpretation  Performed by: Delton Prairie, MD Authorized by: Delton Prairie, MD     Interpretation: normal     ECG rate:  80   ECG rate assessment: normal      Rhythm: sinus rhythm     Ectopy: none     Conduction: normal   .Critical Care  Performed by: Delton Prairie, MD Authorized by: Delton Prairie, MD   Critical care provider statement:    Critical care time (minutes):  30   Critical care time was exclusive of:  Separately billable procedures and treating other patients   Critical care was necessary to treat or prevent imminent or life-threatening deterioration of the following conditions:  Respiratory failure   Critical care was time spent personally by me on the following activities:  Development of treatment plan with patient or surrogate, discussions with consultants, evaluation of patient's response to treatment, examination of patient, ordering and review of laboratory studies, ordering and review of radiographic studies, ordering and performing treatments and interventions, pulse oximetry, re-evaluation of patient's condition and review of old charts   Medications  ipratropium-albuterol (DUONEB) 0.5-2.5 (3) MG/3ML nebulizer solution 6 mL (6 mLs Nebulization Given 10/22/22 2338)  methylPREDNISolone sodium succinate (SOLU-MEDROL) 40 mg/mL injection 40 mg (40 mg Intravenous Given 10/22/22 2337)  ipratropium-albuterol (DUONEB) 0.5-2.5 (3) MG/3ML nebulizer solution 6 mL (6 mLs Nebulization Given 10/23/22 0004)  furosemide (LASIX) injection 40 mg (40 mg Intravenous Given 10/23/22 0012)  ipratropium-albuterol (DUONEB) 0.5-2.5 (3) MG/3ML nebulizer solution 6 mL (6 mLs Nebulization Given 10/23/22 0138)     IMPRESSION / MDM / ASSESSMENT AND PLAN / ED COURSE  I reviewed the triage vital signs and the nursing notes.  Differential diagnosis includes, but is not limited to, pneumonia, pneumothorax, ACS, sepsis, COPD exacerbation, CHF exacerbation, pleural effusion  {Patient presents with symptoms of an acute illness or injury that is potentially life-threatening.  Patient presents with a COP exacerbation requiring medical admission.  Requiring 2 L  nasal cannula, which he occasionally uses at home.  Suspect a small degree of contributory CHF exacerbation for which she receives Lasix IV.  His leukocytosis is noted but I doubt acute infectious etiology of his symptoms.  Suspect this is more related to his current prednisone course.  Troponins are reassuring as well as EKG.  No signs of PTX or further complications.  Due to his persistent wheezing and symptoms as well as pursed lip breathing and dyspnea, we will consult with medicine for admission.  Clinical Course as of 10/23/22 0303  Wynelle Link Oct 22, 2022  2356 Reassessed.  Still wheezing, but louder now with improved airflow [DS]  Mon Oct 23, 2022  0118 Reassessed, similarly loud wheezing persists [DS]  0301 Reassessed.  Still  wheezing.  He is hesitant to stay but I strongly recommended admission, alongside his wife.  He is reluctantly willing [DS]    Clinical Course User Index [DS] Delton Prairie, MD     FINAL CLINICAL IMPRESSION(S) / ED DIAGNOSES   Final diagnoses:  COPD exacerbation (HCC)  Shortness of breath  Hypoxia     Rx / DC Orders   ED Discharge Orders     None        Note:  This document was prepared using Dragon voice recognition software and may include unintentional dictation errors.   Delton Prairie, MD 10/23/22 [phone]

## 2022-10-23 DIAGNOSIS — J441 Chronic obstructive pulmonary disease with (acute) exacerbation: Principal | ICD-10-CM

## 2022-10-23 DIAGNOSIS — J9611 Chronic respiratory failure with hypoxia: Secondary | ICD-10-CM

## 2022-10-23 LAB — COMPREHENSIVE METABOLIC PANEL
ALT: 23 U/L (ref 0–44)
AST: 17 U/L (ref 15–41)
Albumin: 3.9 g/dL (ref 3.5–5.0)
Alkaline Phosphatase: 51 U/L (ref 38–126)
Anion gap: 4 — ABNORMAL LOW (ref 5–15)
BUN: 41 mg/dL — ABNORMAL HIGH (ref 8–23)
CO2: 28 mmol/L (ref 22–32)
Calcium: 9 mg/dL (ref 8.9–10.3)
Chloride: 110 mmol/L (ref 98–111)
Creatinine, Ser: 1.56 mg/dL — ABNORMAL HIGH (ref 0.61–1.24)
GFR, Estimated: 45 mL/min — ABNORMAL LOW (ref >60)
Glucose, Bld: 114 mg/dL — ABNORMAL HIGH (ref 70–99)
Potassium: 4.4 mmol/L (ref 3.5–5.1)
Sodium: 142 mmol/L (ref 135–145)
Total Bilirubin: 0.4 mg/dL (ref 0.3–1.2)
Total Protein: 6.5 g/dL (ref 6.5–8.1)

## 2022-10-23 LAB — PHOSPHORUS: Phosphorus: 3.8 mg/dL (ref 2.5–4.6)

## 2022-10-23 LAB — CBC
HCT: 41.6 % (ref 39.0–52.0)
HCT: 41.8 % (ref 39.0–52.0)
Hemoglobin: 13.9 g/dL (ref 13.0–17.0)
Hemoglobin: 13.9 g/dL (ref 13.0–17.0)
MCH: 29 pg (ref 26.0–34.0)
MCH: 28.8 pg (ref 26.0–34.0)
MCHC: 33.4 g/dL (ref 30.0–36.0)
MCHC: 33.3 g/dL (ref 30.0–36.0)
MCV: 86.8 fL (ref 80.0–100.0)
MCV: 86.5 fL (ref 80.0–100.0)
Platelets: 207 10*3/uL (ref 150–400)
Platelets: 209 10*3/uL (ref 150–400)
RBC: 4.79 MIL/uL (ref 4.22–5.81)
RBC: 4.83 MIL/uL (ref 4.22–5.81)
RDW: 16 % — ABNORMAL HIGH (ref 11.5–15.5)
RDW: 16 % — ABNORMAL HIGH (ref 11.5–15.5)
WBC: 18.1 10*3/uL — ABNORMAL HIGH (ref 4.0–10.5)
WBC: 15.3 10*3/uL — ABNORMAL HIGH (ref 4.0–10.5)
nRBC: 0 % (ref 0.0–0.2)
nRBC: 0 % (ref 0.0–0.2)

## 2022-10-23 LAB — BASIC METABOLIC PANEL
Anion gap: 8 (ref 5–15)
BUN: 38 mg/dL — ABNORMAL HIGH (ref 8–23)
CO2: 27 mmol/L (ref 22–32)
Calcium: 8.6 mg/dL — ABNORMAL LOW (ref 8.9–10.3)
Chloride: 104 mmol/L (ref 98–111)
Creatinine, Ser: 1.46 mg/dL — ABNORMAL HIGH (ref 0.61–1.24)
GFR, Estimated: 49 mL/min — ABNORMAL LOW (ref >60)
Glucose, Bld: 150 mg/dL — ABNORMAL HIGH (ref 70–99)
Potassium: 3.5 mmol/L (ref 3.5–5.1)
Sodium: 139 mmol/L (ref 135–145)

## 2022-10-23 LAB — MAGNESIUM
Magnesium: 2.2 mg/dL (ref 1.7–2.4)
Magnesium: 2.1 mg/dL (ref 1.7–2.4)

## 2022-10-23 LAB — CREATININE, SERUM
Creatinine, Ser: 1.42 mg/dL — ABNORMAL HIGH (ref 0.61–1.24)
GFR, Estimated: 51 mL/min — ABNORMAL LOW (ref >60)

## 2022-10-23 LAB — GLUCOSE, CAPILLARY
Glucose-Capillary: 190 mg/dL — ABNORMAL HIGH (ref 70–99)
Glucose-Capillary: 147 mg/dL — ABNORMAL HIGH (ref 70–99)

## 2022-10-23 LAB — TROPONIN I (HIGH SENSITIVITY)
Troponin I (High Sensitivity): 10 ng/L (ref <18)
Troponin I (High Sensitivity): 10 ng/L (ref <18)

## 2022-10-23 LAB — BRAIN NATRIURETIC PEPTIDE: B Natriuretic Peptide: 44.2 pg/mL (ref 0.0–100.0)

## 2022-10-23 MED ORDER — ENOXAPARIN SODIUM 40 MG/0.4ML IJ SOSY
40.0000 mg | PREFILLED_SYRINGE | INTRAMUSCULAR | Status: DC
  Administered 2022-10-23: 40 mg via SUBCUTANEOUS
  Filled 2022-10-23: qty 0.4

## 2022-10-23 MED ORDER — ACETAMINOPHEN 325 MG PO TABS: 650.0000 mg | ORAL_TABLET | Freq: Four times a day (QID) | ORAL | Status: DC | PRN

## 2022-10-23 MED ORDER — FLUTICASONE FUROATE-VILANTEROL 200-25 MCG/ACT IN AEPB
1.0000 | INHALATION_SPRAY | Freq: Every day | RESPIRATORY_TRACT | Status: DC
  Administered 2022-10-23: 1 via RESPIRATORY_TRACT
  Filled 2022-10-23: qty 28

## 2022-10-23 MED ORDER — DILTIAZEM HCL ER COATED BEADS 180 MG PO CP24
180.0000 mg | ORAL_CAPSULE | Freq: Every day | ORAL | Status: DC
  Administered 2022-10-23: 180 mg via ORAL
  Filled 2022-10-23 (×2): qty 1

## 2022-10-23 MED ORDER — METHYLPREDNISOLONE SODIUM SUCC 40 MG IJ SOLR
40.0000 mg | Freq: Two times a day (BID) | INTRAMUSCULAR | Status: DC
  Administered 2022-10-23: 40 mg via INTRAVENOUS
  Filled 2022-10-23: qty 1

## 2022-10-23 MED ORDER — IPRATROPIUM-ALBUTEROL 0.5-2.5 (3) MG/3ML IN SOLN
6.0000 mL | Freq: Once | RESPIRATORY_TRACT | Status: AC
  Administered 2022-10-23: 6 mL via RESPIRATORY_TRACT
  Filled 2022-10-23: qty 6

## 2022-10-23 MED ORDER — BENZONATATE 100 MG PO CAPS: 200.0000 mg | ORAL_CAPSULE | Freq: Three times a day (TID) | ORAL | Status: DC | PRN

## 2022-10-23 MED ORDER — PREDNISONE 20 MG PO TABS: ORAL_TABLET | ORAL | 0 refills | Status: AC

## 2022-10-23 MED ORDER — SIMVASTATIN 20 MG PO TABS: 40.0000 mg | ORAL_TABLET | Freq: Every day | ORAL | Status: DC

## 2022-10-23 MED ORDER — ATORVASTATIN CALCIUM 20 MG PO TABS: 20.0000 mg | ORAL_TABLET | Freq: Every day | ORAL | Status: DC

## 2022-10-23 MED ORDER — GUAIFENESIN ER 600 MG PO TB12: 600.0000 mg | ORAL_TABLET | Freq: Two times a day (BID) | ORAL | 0 refills | Status: AC

## 2022-10-23 MED ORDER — FUROSEMIDE 10 MG/ML IJ SOLN
40.0000 mg | Freq: Once | INTRAMUSCULAR | Status: AC
  Administered 2022-10-23: 40 mg via INTRAVENOUS
  Filled 2022-10-23: qty 4

## 2022-10-23 MED ORDER — AMOXICILLIN-POT CLAVULANATE 875-125 MG PO TABS: 1.0000 | ORAL_TABLET | Freq: Two times a day (BID) | ORAL | 0 refills | Status: AC

## 2022-10-23 MED ORDER — IPRATROPIUM-ALBUTEROL 0.5-2.5 (3) MG/3ML IN SOLN
3.0000 mL | Freq: Four times a day (QID) | RESPIRATORY_TRACT | Status: DC
  Administered 2022-10-23: 3 mL via RESPIRATORY_TRACT
  Filled 2022-10-23: qty 3

## 2022-10-23 MED ORDER — GUAIFENESIN ER 600 MG PO TB12
600.0000 mg | ORAL_TABLET | Freq: Two times a day (BID) | ORAL | Status: DC
  Administered 2022-10-23: 600 mg via ORAL
  Filled 2022-10-23: qty 1

## 2022-10-23 MED ORDER — ONDANSETRON HCL 4 MG PO TABS: 4.0000 mg | ORAL_TABLET | Freq: Four times a day (QID) | ORAL | Status: DC | PRN

## 2022-10-23 MED ORDER — ACETAMINOPHEN 650 MG RE SUPP: 650.0000 mg | Freq: Four times a day (QID) | RECTAL | Status: DC | PRN

## 2022-10-23 MED ORDER — ONDANSETRON HCL 4 MG/2ML IJ SOLN: 4.0000 mg | Freq: Four times a day (QID) | INTRAMUSCULAR | Status: DC | PRN

## 2022-10-23 MED ORDER — SODIUM CHLORIDE 0.9 % IV SOLN
1.0000 g | INTRAVENOUS | Status: DC
  Administered 2022-10-23: 1 g via INTRAVENOUS
  Filled 2022-10-23: qty 10

## 2022-10-23 MED ORDER — HYDROCOD POLI-CHLORPHE POLI ER 10-8 MG/5ML PO SUER: 5.0000 mL | Freq: Two times a day (BID) | ORAL | 0 refills | Status: AC | PRN

## 2022-10-23 MED ORDER — PREDNISONE 20 MG PO TABS: 40.0000 mg | ORAL_TABLET | Freq: Every day | ORAL | Status: DC

## 2022-10-23 MED ORDER — ALBUTEROL SULFATE (2.5 MG/3ML) 0.083% IN NEBU: 2.5000 mg | INHALATION_SOLUTION | RESPIRATORY_TRACT | Status: DC | PRN

## 2022-10-23 NOTE — Assessment & Plan Note (Signed)
Continue simvastatin. 

## 2022-10-23 NOTE — Plan of Care (Signed)
  Problem: Education: Goal: Knowledge of disease or condition will improve Outcome: Progressing Goal: Knowledge of the prescribed therapeutic regimen will improve Outcome: Progressing   Problem: Activity: Goal: Ability to tolerate increased activity will improve Outcome: Progressing Goal: Will verbalize the importance of balancing activity with adequate rest periods Outcome: Progressing   Problem: Respiratory: Goal: Ability to maintain a clear airway will improve Outcome: Progressing Goal: Levels of oxygenation will improve Outcome: Progressing Goal: Ability to maintain adequate ventilation will improve Outcome: Progressing   

## 2022-10-23 NOTE — Assessment & Plan Note (Signed)
Chronic respiratory failure with hypoxia Chest x-ray nonacute, showing COPD Leukocytosis likely related to outpatient prednisone use Patient was on azithromycin outpatient.  Will give Rocephin Scheduled and as needed nebulized bronchodilator treatments IV steroids Antitussives, flutter valve and incentive spirometer Supplemental oxygen to keep sats over 92%

## 2022-10-23 NOTE — Assessment & Plan Note (Signed)
Renal function at baseline 

## 2022-10-23 NOTE — Progress Notes (Signed)
PHARMACIST - PHYSICIAN ORDER COMMUNICATION  CONCERNING: Diltiazem and Simvastatin  and risk of rhabdomyolysis  DESCRIPTION:  Patients on diltiazem and simvastatin >10 mg/day have reported cases of rhabdomyolysis. Pharmacy is to assess simvastatin dose. If >10 mg, substitute atorvastatin (Lipitor) 1mg  for each 2mg  simvastatin.  This patient is ordered simvastatin 40 mg and diltiazem CD 180 mg.    ACTION TAKEN: Per protocol pharmacy has discontinued the patient's order for simvastatin and replaced it with Atorvastatin 20 mg.    Will M. Dareen Piano, PharmD PGY-1 Pharmacy Resident 10/23/2022 8:28 AM

## 2022-10-23 NOTE — H&P (Addendum)
History and Physical    Patient: Bryan Mcclain NWG:[id number] DOB: 1946-02-13 DOA: 10/22/2022 DOS: the patient was seen and examined on 10/23/2022 PCP: Danella Penton, MD  Patient coming from: Home  Chief Complaint:  Chief Complaint  Patient presents with   Shortness of Breath    HPI: Bryan Mcclain is a 77 y.o. male with medical history significant for COPD on home O2 at 2 L as needed and nightly, HTN, CKD 3a being admitted for COPD exacerbation.  Patient saw his PCP several days prior and was started on azithromycin and placed on a tapering prednisone pack which she is currently finishing up however in spite of home bronchodilator treatments and outpatient treatment he continues to have ongoing wheezing.  He has been using his oxygen around-the-clock . ED course and data review: Vitals within normal limits except for slightly elevated blood pressure of 165/78 on arrival.  Labs significant for WBC of 19,000.  Renal function at baseline of 1.56.  Troponin 10 and BNP 44.EKG, personally reviewed and interpreted showing sinus rhythm at 83 with no acute ischemic changes on EKG. Chest x-ray showing findings consistent with COPD without acute disease Patient was treated with multiple DuoNebs and methylprednisolone in an attempt to discharge him however he continued to wheeze and hospitalization requested   Review of Systems: As mentioned in the history of present illness. All other systems reviewed and are negative.  Past Medical History:  Diagnosis Date   COPD (chronic obstructive pulmonary disease) (HCC)    Hyperlipidemia    Hypertension    Past Surgical History:  Procedure Laterality Date   APPENDECTOMY     BACK SURGERY     CATARACT EXTRACTION     NASAL SEPTUM SURGERY     Social History:  reports that he quit smoking about a year ago. His smoking use included cigarettes. He has a 42.00 pack-year smoking history. He has never used smokeless tobacco. He reports that he does not currently use  alcohol. He reports that he does not currently use drugs.  Allergies  Allergen Reactions   Moxifloxacin Hives and Shortness Of Breath    Family History  Problem Relation Age of Onset   Diabetes Mother     Prior to Admission medications   Medication Sig Start Date End Date Taking? Authorizing Provider  albuterol (PROVENTIL) (2.5 MG/3ML) 0.083% nebulizer solution Take 2.5 mg by nebulization 4 (four) times daily. 07/13/21   [provider]  albuterol (VENTOLIN HFA) 108 (90 Base) MCG/ACT inhaler Inhale 2 puffs into the lungs every 6 (six) hours as needed. Patient not taking: Reported on 05/11/2022 10/12/21   [provider]  albuterol (VENTOLIN HFA) 108 (90 Base) MCG/ACT inhaler Inhale into the lungs. 03/13/22   [provider]  alfuzosin (UROXATRAL) 10 MG 24 hr tablet Take 10 mg by mouth daily. Patient not taking: Reported on 05/11/2022 10/30/21   [provider]  alfuzosin (UROXATRAL) 10 MG 24 hr tablet Take 1 tablet by mouth daily. 02/02/22   [provider]  aspirin 81 MG chewable tablet Chew 81 mg by mouth daily. Patient not taking: Reported on 05/11/2022    [provider]  benzonatate (TESSALON) 200 MG capsule Take 200 mg by mouth 3 (three) times daily as needed. 04/10/22   [provider]  cetirizine (ZYRTEC) 10 MG tablet Take 10 mg by mouth daily.    [provider]  cetirizine (ZYRTEC) 10 MG tablet Take by mouth. Patient not taking: Reported on 05/11/2022  [provider]  diltiazem (CARDIZEM CD) 180 MG 24 hr capsule Take by mouth. 01/02/22 01/02/23  [provider]  dutasteride (AVODART) 0.5 MG capsule Take 0.5 mg by mouth daily. 10/30/21   [provider]  fluticasone (FLONASE) 50 MCG/ACT nasal spray Place into the nose. 03/30/22 03/30/23  [provider]  hydrochlorothiazide (HYDRODIURIL) 12.5 MG tablet Take 12.5 mg by mouth daily. 12/13/21   [provider]   ipratropium-albuterol (DUONEB) 0.5-2.5 (3) MG/3ML SOLN Take 3 mLs by nebulization 2 (two) times daily. Patient not taking: Reported on 05/11/2022 08/03/21   [provider]  ipratropium-albuterol (DUONEB) 0.5-2.5 (3) MG/3ML SOLN Inhale into the lungs. 03/13/22 03/08/23  [provider]  loratadine (CLARITIN) 10 MG tablet Take 1 tablet (10 mg total) by mouth daily. Patient not taking: Reported on 05/11/2022 11/06/21   Willeen Niece, MD  loratadine (CLARITIN) 10 MG tablet Take 1 tablet by mouth daily as needed. 11/06/21   [provider]  LUMIGAN 0.01 % SOLN Place 1 drop into both eyes every evening. 07/13/21   [provider]  Melatonin 10 MG CAPS Take by mouth.    [provider]  naproxen sodium (ALEVE) 220 MG tablet Take by mouth.    [provider]  nystatin (MYCOSTATIN) 100000 UNIT/ML suspension Take 5 mLs by mouth 4 (four) times daily. 11/18/21   [provider]  olmesartan (BENICAR) 20 MG tablet Take 20 mg by mouth daily. 08/03/21   [provider]  predniSONE (DELTASONE) 5 MG tablet Advised to take prednisone 50 mg daily and wean by 5 mg every day. Patient not taking: Reported on 05/11/2022 11/05/21   Willeen Niece, MD  simvastatin (ZOCOR) 40 MG tablet Take 40 mg by mouth daily. Patient not taking: Reported on 05/11/2022 10/07/21   [provider]  simvastatin (ZOCOR) 40 MG tablet Take 1 tablet by mouth at bedtime. 02/02/22   [provider]  TRELEGY ELLIPTA 100-62.5-25 MCG/ACT AEPB Inhale 1 puff into the lungs daily. Patient not taking: Reported on 05/11/2022 10/30/21   [provider]  Dwyane Luo 200-62.5-25 MCG/ACT AEPB Inhale 1 puff into the lungs daily.    [provider]  verapamil (VERELAN PM) 240 MG 24 hr capsule Take 240 mg by mouth daily. Patient not taking: Reported on 05/11/2022 08/17/21   [provider]    Physical Exam: Vitals:   10/22/22 2250 10/22/22 2257  10/23/22 0250 10/23/22 0253  BP:  (!) 165/78 134/63   Pulse:  76 93   Resp:  17 11   Temp:  98.8 F (37.1 C)  98.9 F (37.2 C)  TempSrc:  Oral  Oral  SpO2:  96% 93%   Weight: 86.2 kg 80.5 kg    Height: 6' (1.829 m)      Physical Exam Vitals and nursing note reviewed.  Constitutional:      General: He is not in acute distress. HENT:     Head: Normocephalic and atraumatic.  Cardiovascular:     Rate and Rhythm: Normal rate and regular rhythm.     Heart sounds: Normal heart sounds.  Pulmonary:     Effort: Pulmonary effort is normal.     Breath sounds: Wheezing present.  Abdominal:     Palpations: Abdomen is soft.     Tenderness: There is no abdominal tenderness.  Neurological:     Mental Status: Mental status is at baseline.     Labs on Admission: I have personally reviewed following labs and imaging studies  CBC: Recent Labs  Lab 10/22/22 2322  WBC 19.8*  NEUTROABS 16.5*  HGB 14.5  HCT 44.1  MCV 88.6  PLT 216   Basic Metabolic Panel: Recent Labs  Lab 10/22/22 2322  NA 142  K 4.4  CL 110  CO2 28  GLUCOSE 114*  BUN 41*  CREATININE 1.56*  CALCIUM 9.0  MG 2.2   GFR: Estimated Creatinine Clearance: 42.8 mL/min (A) (by C-G formula based on SCr of 1.56 mg/dL (H)). Liver Function Tests: Recent Labs  Lab 10/22/22 2322  AST 17  ALT 23  ALKPHOS 51  BILITOT 0.4  PROT 6.5  ALBUMIN 3.9   No results for input(s): "LIPASE", "AMYLASE" in the last 168 hours. No results for input(s): "AMMONIA" in the last 168 hours. Coagulation Profile: No results for input(s): "INR", "PROTIME" in the last 168 hours. Cardiac Enzymes: No results for input(s): "CKTOTAL", "CKMB", "CKMBINDEX", "TROPONINI" in the last 168 hours. BNP (last 3 results) No results for input(s): "PROBNP" in the last 8760 hours. HbA1C: No results for input(s): "HGBA1C" in the last 72 hours. CBG: No results for input(s): "GLUCAP" in the last 168 hours. Lipid Profile: No results for input(s): "CHOL",  "HDL", "LDLCALC", "TRIG", "CHOLHDL", "LDLDIRECT" in the last 72 hours. Thyroid Function Tests: No results for input(s): "TSH", "T4TOTAL", "FREET4", "T3FREE", "THYROIDAB" in the last 72 hours. Anemia Panel: No results for input(s): "VITAMINB12", "FOLATE", "FERRITIN", "TIBC", "IRON", "RETICCTPCT" in the last 72 hours. Urine analysis: No results found for: "COLORURINE", "APPEARANCEUR", "LABSPEC", "PHURINE", "GLUCOSEU", "HGBUR", "BILIRUBINUR", "KETONESUR", "PROTEINUR", "UROBILINOGEN", "NITRITE", "LEUKOCYTESUR"  Radiological Exams on Admission: DG Chest 2 View  Result Date: 10/22/2022 CLINICAL DATA:  Shortness of breath. EXAM: CHEST - 2 VIEW COMPARISON:  April 13, 2022 FINDINGS: The heart size and mediastinal contours are within normal limits. The lungs are hyperinflated with flattening of both diaphragms. Mild stable left costophrenic angle blunting is noted. There is no evidence of acute infiltrate, pleural effusion or pneumothorax. Chronic left-sided rib fractures are seen. Multilevel degenerative changes seen throughout the thoracic spine. IMPRESSION: Findings likely consistent with COPD, without acute cardiopulmonary disease. Electronically Signed   By: Aram Candela M.D.   On: 10/22/2022 23:44     Data Reviewed: Relevant notes from primary care and specialist visits, past discharge summaries as available in EHR, including Care Everywhere. Prior diagnostic testing as pertinent to current admission diagnoses Updated medications and problem lists for reconciliation ED course, including vitals, labs, imaging, treatment and response to treatment Triage notes, nursing and pharmacy notes and ED provider's notes Notable results as noted in HPI   Assessment and Plan: * COPD with acute exacerbation (HCC) Chronic respiratory failure with hypoxia Chest x-ray nonacute, showing COPD Leukocytosis likely related to outpatient prednisone use Patient was on azithromycin outpatient.  Will give  Rocephin Scheduled and as needed nebulized bronchodilator treatments IV steroids Antitussives, flutter valve and incentive spirometer Supplemental oxygen to keep sats over 92%  Essential hypertension Continue verapamil and olmesartan  Stage 3a chronic kidney disease (CKD) (HCC) Renal function at baseline  Hyperlipidemia Continue simvastatin     DVT prophylaxis: Lovenox  Consults: none  Advance Care Planning:   Code Status: Prior   Family Communication: wife at bedside  Disposition Plan: Back to previous home environment  Severity of Illness: The appropriate patient status for this patient is OBSERVATION. Observation status is judged to be reasonable and necessary in order to provide the required intensity of service to ensure the patient's safety. The patient's presenting symptoms, physical exam findings, and  initial radiographic and laboratory data in the context of their medical condition is felt to place them at decreased risk for further clinical deterioration. Furthermore, it is anticipated that the patient will be medically stable for discharge from the hospital within 2 midnights of admission.   Author: Andris Baumann, MD 10/23/2022 3:44 AM  For on call review [email or web address].

## 2022-10-23 NOTE — Assessment & Plan Note (Signed)
Continue verapamil and olmesartan

## 2022-10-23 NOTE — Discharge Summary (Signed)
[facility] Discharge Summary   Patient: Bryan Mcclain ZOX:[id number]  PCP: Danella Penton, MD  Date of admission: 10/22/2022   Date of discharge:  10/23/2022     Discharge Diagnoses:  Principal Problem:   COPD with acute exacerbation (HCC) Active Problems:   Chronic respiratory failure with hypoxia (HCC)   Essential hypertension   Hyperlipidemia   Stage 3a chronic kidney disease (CKD) (HCC)   Admitted From: Home Disposition:  Home   Recommendations for Outpatient Follow-up:  PCP: in 1 wk Follow up LABS/TEST:     Diet recommendation: Cardiac diet  Activity: The patient is advised to gradually reintroduce usual activities, as tolerated  Discharge Condition: stable  Code Status: Full code   History of present illness: As per the H and P dictated on admission Hospital Course:  Bryan Mcclain is a 77 y.o. male with medical history significant for COPD on home O2 at 2 L as needed and nightly, HTN, CKD 3a being admitted for COPD exacerbation.  Patient saw his PCP several days prior and was started on azithromycin and placed on a tapering prednisone pack which she is currently finishing up however in spite of home bronchodilator treatments and outpatient treatment he continues to have ongoing wheezing.  He has been using his oxygen around-the-clock . ED course and data review: Vitals within normal limits except for slightly elevated blood pressure of 165/78 on arrival.  Labs significant for WBC of 19,000.  Renal function at baseline of 1.56.  Troponin 10 and BNP 44.EKG, personally reviewed and interpreted showing sinus rhythm at 83 with no acute ischemic changes on EKG. Chest x-ray showing findings consistent with COPD without acute disease Patient was treated with multiple DuoNebs and methylprednisolone in an attempt to discharge him however he continued to wheeze and hospitalization requested   Assessment and plan # COPD with acute exacerbation, Chronic respiratory failure with  hypoxia Chest x-ray nonacute, showing COPD. Leukocytosis likely related to outpatient prednisone use. Patient was on azithromycin outpatient.  S/p Rocephin. Scheduled and as needed nebulized bronchodilator treatments. S/p IV steroids.  Patient's respiratory status improved but is still wheezing quite a bit.  Saturating well on room air.  Patient was given an option to stay overnight but patient started that he is feeling fine and he would like to go home.  Patient was discharged on Augmentin twice daily for 5 days, Mucinex 6 mg p.o. twice daily for 7 days, continue DuoNeb as needed.  Prednisone tapering dose.  Resume Trelegy Ellipta inhaler.  Tessalon perles as needed.  Patient was advised to follow with PCP and pulmonologist as an outpatient. # Essential hypertension, Continue verapamil and olmesartan # Stage 3a chronic kidney disease (CKD) Renal function at baseline # Hyperlipidemia, Continue simvastatin  Body mass index is 23.11 kg/m.  Nutrition Interventions:   Patient was ambulatory without any assistance. On the day of the discharge the patient's vitals were stable, and no other acute medical condition were reported by patient. the patient was felt safe to be discharge at Home  Patient is still wheezing, saturating 94% on room air at rest.  Patient was advised to walk around in the hallway and if oxygen drops then he can continue to use it during ambulation, patient uses oxygen at nighttime at home.  Patient was advised to stay another day to monitor but he was feeling that he can go home.  So plan is to discharge him home.  Consultants: None Procedures: None  Discharge Exam: General: Appear in no  distress, no Rash; Oral Mucosa Clear, moist. Cardiovascular: S1 and S2 Present, no Murmur, Respiratory: Mild shortness of breath, good air entry bilaterally, bilateral wheezing. Abdomen: Bowel Sound present, Soft and no tenderness, no hernia Extremities: no Pedal edema, no calf  tenderness Neurology: alert and oriented to time, place, and person affect appropriate.  Filed Weights   10/22/22 2250 10/22/22 2257 10/23/22 0500  Weight: 86.2 kg 80.5 kg 77.3 kg   Vitals:   10/23/22 0747 10/23/22 0747  BP: 129/76 129/76  Pulse: 88 86  Resp: 16 16  Temp: 98.4 F (36.9 C) 98.4 F (36.9 C)  SpO2: 96% 96%    DISCHARGE MEDICATION: Allergies as of 10/23/2022       Reactions   Moxifloxacin Hives, Shortness Of Breath        Medication List     STOP taking these medications    aspirin 81 MG chewable tablet   verapamil 240 MG 24 hr capsule Commonly known as: VERELAN       TAKE these medications    albuterol 108 (90 Base) MCG/ACT inhaler Commonly known as: VENTOLIN HFA Inhale into the lungs. What changed: Another medication with the same name was removed. Continue taking this medication, and follow the directions you see here.   alfuzosin 10 MG 24 hr tablet Commonly known as: UROXATRAL Take 1 tablet by mouth daily. What changed: Another medication with the same name was removed. Continue taking this medication, and follow the directions you see here.   amoxicillin-clavulanate 875-125 MG tablet Commonly known as: AUGMENTIN Take 1 tablet by mouth 2 (two) times daily for 5 days.   benzonatate 200 MG capsule Commonly known as: TESSALON Take 200 mg by mouth 3 (three) times daily as needed.   cetirizine 10 MG tablet Commonly known as: ZYRTEC Take 10 mg by mouth daily. What changed: Another medication with the same name was removed. Continue taking this medication, and follow the directions you see here.   chlorpheniramine-HYDROcodone 10-8 MG/5ML Commonly known as: TUSSIONEX Take 5 mLs by mouth every 12 (twelve) hours as needed for up to 7 days for cough.   diltiazem 180 MG 24 hr capsule Commonly known as: CARDIZEM CD Take by mouth.   dutasteride 0.5 MG capsule Commonly known as: AVODART Take 0.5 mg by mouth daily.   fluticasone 50 MCG/ACT  nasal spray Commonly known as: FLONASE Place into the nose.   guaiFENesin 600 MG 12 hr tablet Commonly known as: MUCINEX Take 1 tablet (600 mg total) by mouth 2 (two) times daily for 14 days.   hydrochlorothiazide 12.5 MG tablet Commonly known as: HYDRODIURIL Take 12.5 mg by mouth daily.   ipratropium-albuterol 0.5-2.5 (3) MG/3ML Soln Commonly known as: DUONEB Inhale into the lungs. What changed: Another medication with the same name was removed. Continue taking this medication, and follow the directions you see here.   loratadine 10 MG tablet Commonly known as: CLARITIN Take 1 tablet by mouth daily as needed. What changed: Another medication with the same name was removed. Continue taking this medication, and follow the directions you see here.   Lumigan 0.01 % Soln Generic drug: bimatoprost Place 1 drop into both eyes every evening.   Melatonin 10 MG Caps Take by mouth.   naproxen sodium 220 MG tablet Commonly known as: ALEVE Take by mouth.   nystatin 100000 UNIT/ML suspension Commonly known as: MYCOSTATIN Take 5 mLs by mouth 4 (four) times daily.   olmesartan 20 MG tablet Commonly known as: BENICAR Take 20 mg  by mouth daily.   predniSONE 20 MG tablet Commonly known as: DELTASONE Take 2 tablets (40 mg total) by mouth daily with breakfast for 3 days, THEN 1.5 tablets (30 mg total) daily with breakfast for 3 days, THEN 1 tablet (20 mg total) daily with breakfast for 3 days, THEN 0.5 tablets (10 mg total) daily with breakfast for 3 days. Start taking on: Oct 23, 2022 What changed:  medication strength See the new instructions.   simvastatin 40 MG tablet Commonly known as: ZOCOR Take 1 tablet by mouth at bedtime. What changed: Another medication with the same name was removed. Continue taking this medication, and follow the directions you see here.   Trelegy Ellipta 200-62.5-25 MCG/ACT Aepb Generic drug: Fluticasone-Umeclidin-Vilant Inhale 1 puff into the lungs  daily. What changed: Another medication with the same name was removed. Continue taking this medication, and follow the directions you see here.       Allergies  Allergen Reactions   Moxifloxacin Hives and Shortness Of Breath   Discharge Instructions     Call MD for:  difficulty breathing, headache or visual disturbances   Complete by: As directed    Call MD for:  extreme fatigue   Complete by: As directed    Call MD for:  persistant dizziness or light-headedness   Complete by: As directed    Call MD for:  persistant nausea and vomiting   Complete by: As directed    Call MD for:  severe uncontrolled pain   Complete by: As directed    Call MD for:  temperature >100.4   Complete by: As directed    Diet - low sodium heart healthy   Complete by: As directed    Discharge instructions   Complete by: As directed    Follow-up with PCP in 1 week   Increase activity slowly   Complete by: As directed        The results of significant diagnostics from this hospitalization (including imaging, microbiology, ancillary and laboratory) are listed below for reference.    Significant Diagnostic Studies: DG Chest 2 View  Result Date: 10/22/2022 CLINICAL DATA:  Shortness of breath. EXAM: CHEST - 2 VIEW COMPARISON:  April 13, 2022 FINDINGS: The heart size and mediastinal contours are within normal limits. The lungs are hyperinflated with flattening of both diaphragms. Mild stable left costophrenic angle blunting is noted. There is no evidence of acute infiltrate, pleural effusion or pneumothorax. Chronic left-sided rib fractures are seen. Multilevel degenerative changes seen throughout the thoracic spine. IMPRESSION: Findings likely consistent with COPD, without acute cardiopulmonary disease. Electronically Signed   By: Aram Candela M.D.   On: 10/22/2022 23:44   CT CHEST WO CONTRAST  Result Date: 10/22/2022 CLINICAL DATA:  Follow-up pulmonary nodules.  COPD. EXAM: CT CHEST WITHOUT  CONTRAST TECHNIQUE: Multidetector CT imaging of the chest was performed following the standard protocol without IV contrast. RADIATION DOSE REDUCTION: This exam was performed according to the departmental dose-optimization program which includes automated exposure control, adjustment of the mA and/or kV according to patient size and/or use of iterative reconstruction technique. COMPARISON:  11/08/2021 chest CT angiogram. 04/13/2022 chest radiograph. FINDINGS: Cardiovascular: Normal heart size. Small pericardial effusion, slightly decreased. Three-vessel coronary atherosclerosis. Atherosclerotic nonaneurysmal thoracic aorta. Normal caliber pulmonary arteries. Mediastinum/Nodes: No significant thyroid nodules. Unremarkable esophagus. No pathologically enlarged axillary, mediastinal or hilar lymph nodes, noting limited sensitivity for the detection of hilar adenopathy on this noncontrast study. Lungs/Pleura: No pneumothorax. No pleural effusion. Moderate centrilobular emphysema with diffuse  bronchial wall thickening. Indistinct ground-glass 1.3 cm central right upper lobe pulmonary nodule (series 3/image 64), previously 1.3 cm on 11/02/2021 CT, stable. Previously described patchy consolidation and ground-glass opacity in the anteromedial apical right upper lobe on 11/08/2021 chest CTA has resolved. Several solid subpleural left pulmonary nodules, largest 1.9 cm in the basilar left lower lobe (series 3/image 147), previously 1.9 cm using similar measurement technique on 11/08/2021 and 11/02/2021 CT studies, all stable. No new significant pulmonary nodules. Mildly thick curvilinear parenchymal band in the peripheral left lower lobe is unchanged and compatible with bland mild postinfectious/postinflammatory scarring. No acute consolidative airspace disease. Upper abdomen: Scattered subcentimeter hypodense liver lesions are too small to characterize and are unchanged, considered benign. Nonobstructing 2 mm upper right  renal stone. Musculoskeletal: No aggressive appearing focal osseous lesions. Marked thoracic spondylosis. IMPRESSION: 1. Previously described patchy consolidation and ground-glass opacity in the anteromedial apical right upper lobe on 11/08/2021 chest CTA has resolved. 2. Indistinct ground-glass 1.3 cm central right upper lobe pulmonary nodule and several scattered solid subpleural left pulmonary nodules up to 1.9 cm in the basilar left lower lobe, all stable since 11/02/2021 CT, probably benign. Recommend attention on follow-up noncontrast chest CT in 12 months. 3. Three-vessel coronary atherosclerosis. 4. Small pericardial effusion, slightly decreased. 5. Nonobstructing right nephrolithiasis. 6. Aortic Atherosclerosis (ICD10-I70.0) and Emphysema (ICD10-J43.9). Electronically Signed   By: Delbert Phenix M.D.   On: 10/22/2022 14:09    Microbiology: No results found for this or any previous visit (from the past 240 hour(s)).   Labs: CBC: Recent Labs  Lab 10/22/22 2322 10/23/22 0408 10/23/22 0849  WBC 19.8* 18.1* 15.3*  NEUTROABS 16.5*  --   --   HGB 14.5 13.9 13.9  HCT 44.1 41.6 41.8  MCV 88.6 86.8 86.5  PLT 216 207 209   Basic Metabolic Panel: Recent Labs  Lab 10/22/22 2322 10/23/22 0408 10/23/22 0849  NA 142  --  139  K 4.4  --  3.5  CL 110  --  104  CO2 28  --  27  GLUCOSE 114*  --  150*  BUN 41*  --  38*  CREATININE 1.56* 1.42* 1.46*  CALCIUM 9.0  --  8.6*  MG 2.2  --  2.1  PHOS  --   --  3.8   Liver Function Tests: Recent Labs  Lab 10/22/22 2322  AST 17  ALT 23  ALKPHOS 51  BILITOT 0.4  PROT 6.5  ALBUMIN 3.9   No results for input(s): "LIPASE", "AMYLASE" in the last 168 hours. No results for input(s): "AMMONIA" in the last 168 hours. Cardiac Enzymes: No results for input(s): "CKTOTAL", "CKMB", "CKMBINDEX", "TROPONINI" in the last 168 hours. BNP (last 3 results) Recent Labs    11/01/21 1024 04/10/22 1614 10/22/22 2322  BNP 53.0 88.5 44.2   CBG: Recent Labs   Lab 10/23/22 0444 10/23/22 0749  GLUCAP 190* 147*    Time spent: 35 minutes  Signed:  Gillis Santa  [facility] 10/23/2022 11:08 AM

## 2023-01-09 ENCOUNTER — Other Ambulatory Visit: Payer: Self-pay | Admitting: Pulmonary Disease

## 2023-01-09 DIAGNOSIS — R911 Solitary pulmonary nodule: Secondary | ICD-10-CM

## 2023-02-14 ENCOUNTER — Ambulatory Visit
Admission: RE | Admit: 2023-02-14 | Discharge: 2023-02-14 | Disposition: A | Discharge status: Home / Self Care | Payer: Medicare PPO | Source: Ambulatory Visit | Attending: Pulmonary Disease | Admitting: Pulmonary Disease

## 2023-02-14 DIAGNOSIS — R911 Solitary pulmonary nodule: Secondary | ICD-10-CM

## 2023-02-14 DIAGNOSIS — R918 Other nonspecific abnormal finding of lung field: Secondary | ICD-10-CM | POA: Insufficient documentation

## 2023-02-14 DIAGNOSIS — I700 Atherosclerosis of aorta: Secondary | ICD-10-CM | POA: Diagnosis not present

## 2023-02-14 DIAGNOSIS — I2510 Atherosclerotic heart disease of native coronary artery without angina pectoris: Secondary | ICD-10-CM | POA: Diagnosis not present

## 2023-02-14 LAB — GLUCOSE, CAPILLARY: Glucose-Capillary: 116 mg/dL — ABNORMAL HIGH (ref 70–99)

## 2023-02-14 MED ORDER — FLUDEOXYGLUCOSE F - 18 (FDG) INJECTION: 7.5000 | Freq: Once | INTRAVENOUS | Status: DC

## 2023-02-14 MED ORDER — FLUDEOXYGLUCOSE F - 18 (FDG) INJECTION
8.8000 | Freq: Once | INTRAVENOUS | Status: AC
  Administered 2023-02-14: 9.36 via INTRAVENOUS

## 2023-02-26 ENCOUNTER — Other Ambulatory Visit: Payer: Self-pay | Admitting: *Deleted

## 2023-02-26 NOTE — Progress Notes (Signed)
Per Dr. Karna Christmas - requests to add pt for tumor board discussion on 10/3 to review recent imaging and discussed further recommendations.

## 2023-03-01 ENCOUNTER — Inpatient Hospital Stay: Payer: Medicare PPO | Attending: Pulmonary Disease

## 2023-03-01 NOTE — Progress Notes (Signed)
Recommendations received from case conference discussion have been communicated to Dr. Karna Christmas.

## 2023-03-07 IMAGING — CR DG CHEST 2V
2 series · 2 of 2 positions shown · non-contrast
Comparison: None Available.

CLINICAL DATA: Shortness of breath

EXAM:
CHEST - 2 VIEW

[chest pa]
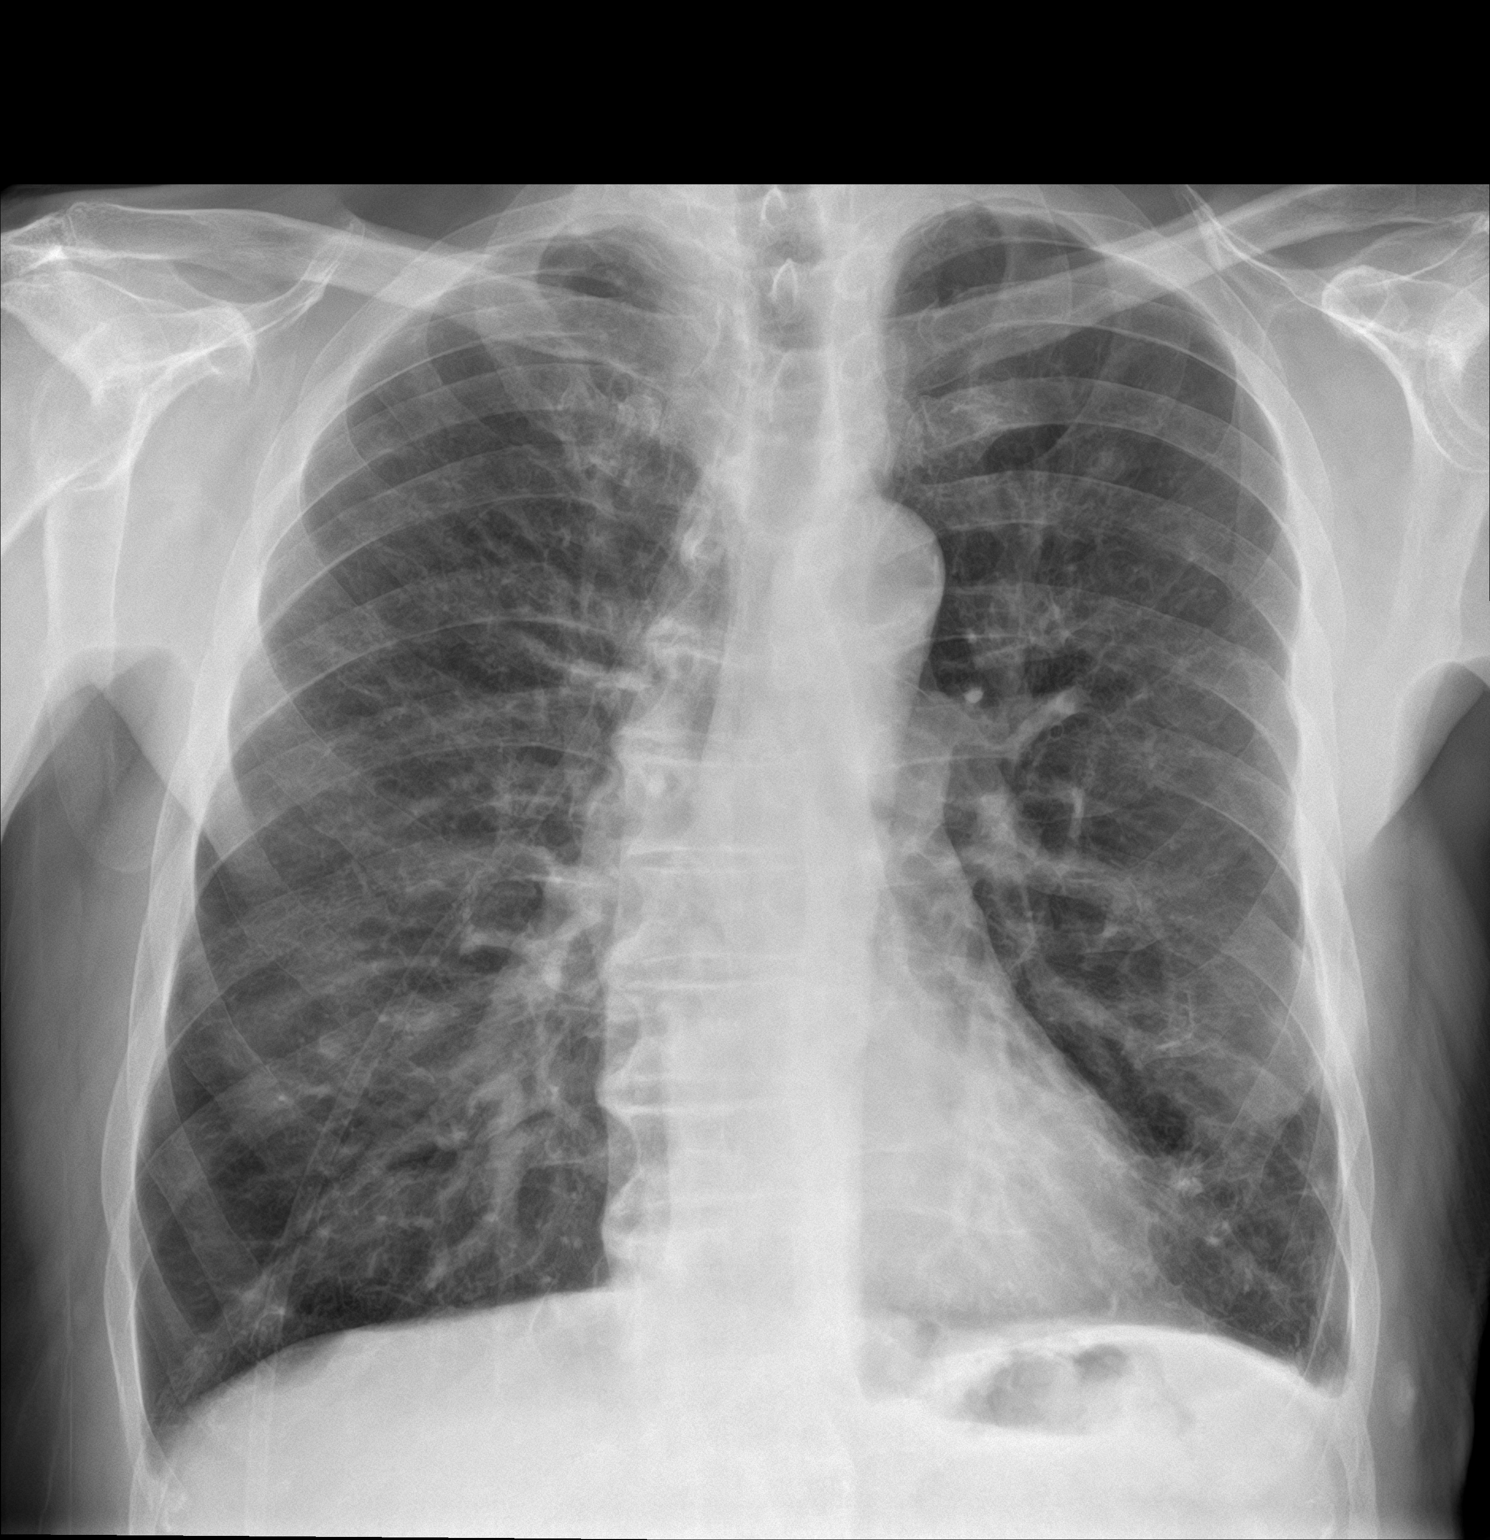

[chest lat]
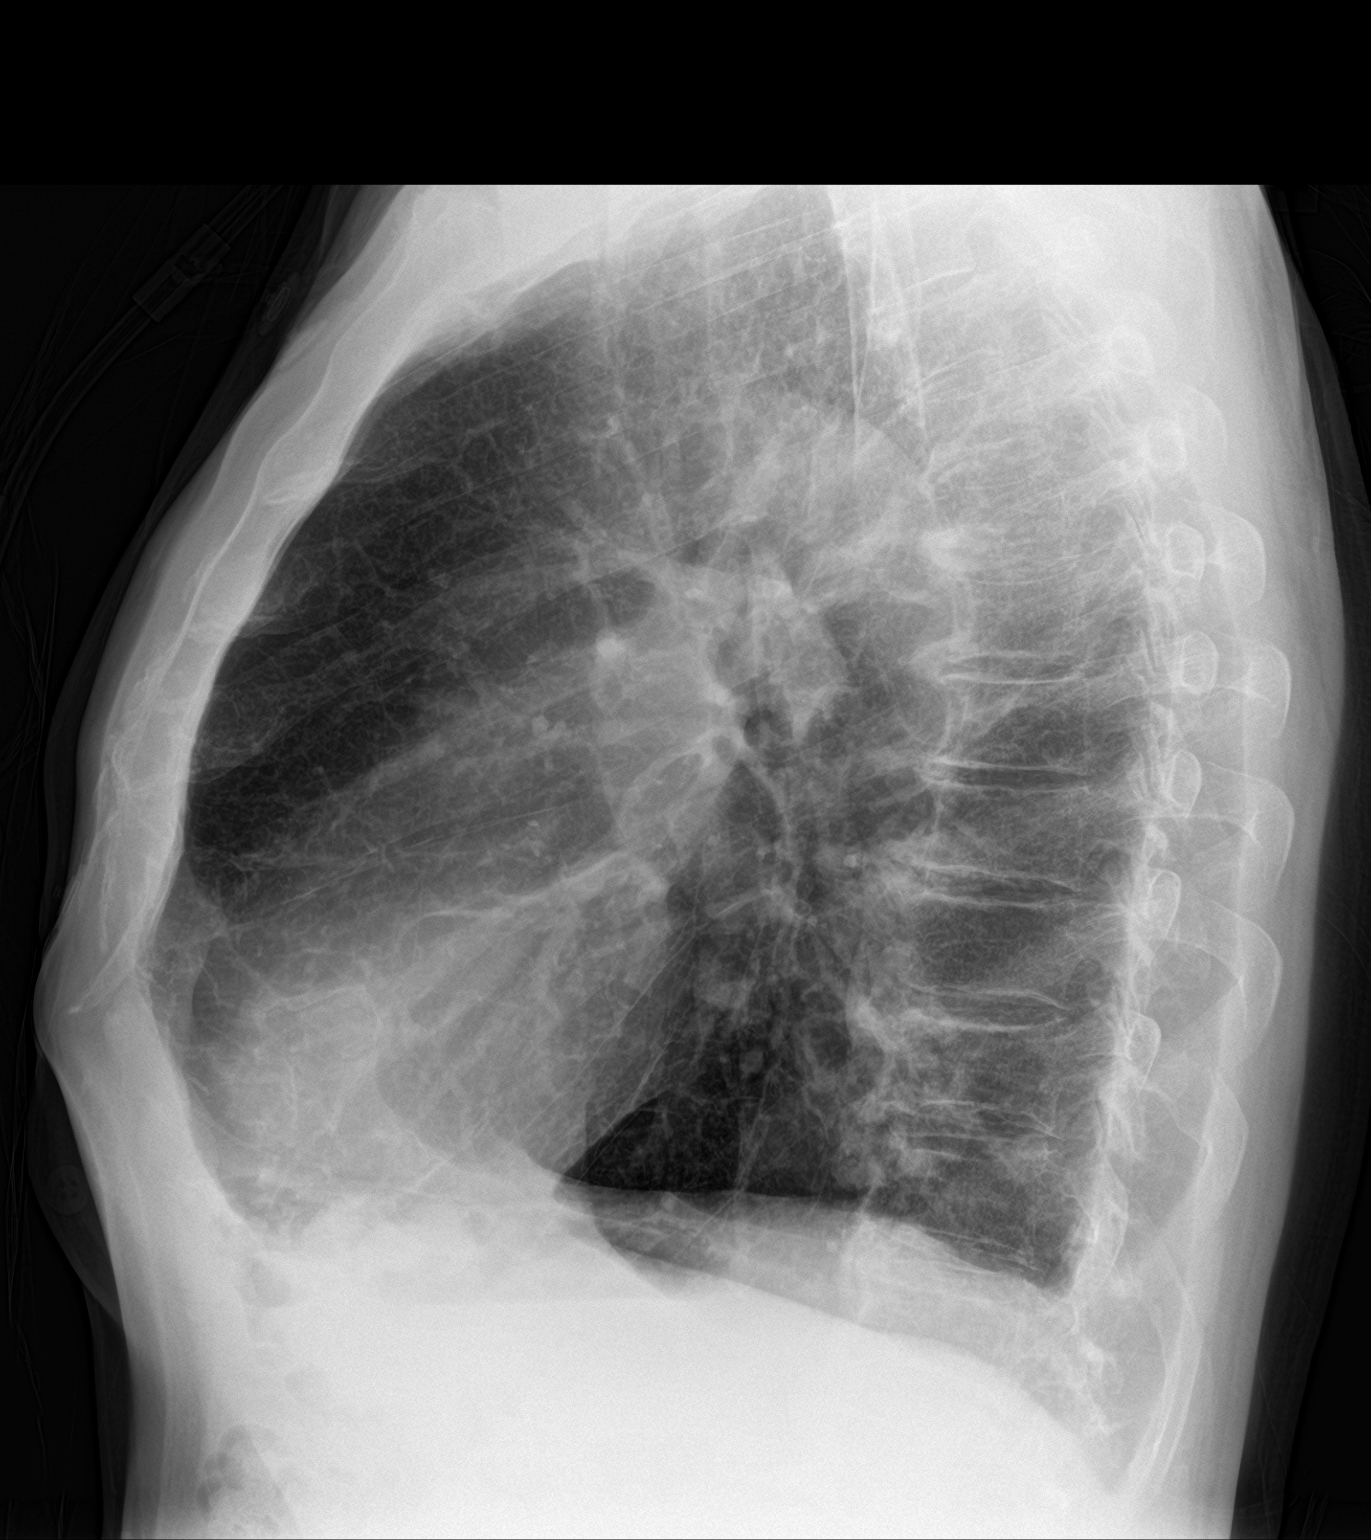

[2 of 2 positions shown; findings below may reference images not displayed]

FINDINGS: Background changes of COPD with likely chronic interstitial
prominence. No consolidation or edema. No pleural effusion or
pneumothorax. Normal heart size.
IMPRESSION: COPD.  No acute process in the chest.

## 2023-04-17 ENCOUNTER — Other Ambulatory Visit: Payer: Self-pay | Admitting: Family Medicine

## 2023-04-17 DIAGNOSIS — M4807 Spinal stenosis, lumbosacral region: Secondary | ICD-10-CM

## 2023-04-18 ENCOUNTER — Ambulatory Visit
Admission: RE | Admit: 2023-04-18 | Discharge: 2023-04-18 | Disposition: A | Discharge status: Home / Self Care | Payer: Medicare PPO | Source: Ambulatory Visit | Attending: Family Medicine | Admitting: Family Medicine

## 2023-04-18 DIAGNOSIS — M4807 Spinal stenosis, lumbosacral region: Secondary | ICD-10-CM

## 2023-05-02 NOTE — Progress Notes (Unsigned)
Referring Physician:  Reinaldo Berber, MD [address] [place],  [place] [address]  Primary Physician:  Danella Penton, MD  History of Present Illness: 05/03/2023 Bryan Mcclain is here today with a chief complaint of left low back pain that radiates into the left hip/leg to the toes.  He has pain that goes down the back of his legs.  He additionally has discomfort in the front of his thigh and muscle wasting and weakness in his quadriceps muscle.  He has severe pain with many activities.  Standing and walking are a particular issue.  He has been having progressive loss of function over the past several months.    Bowel/Bladder Dysfunction: none  Conservative measures:  Physical therapy:  has not participated in PT Multimodal medical therapy including regular antiinflammatories: prednisone, tramadol, Aleve, Hydrocodone, Gabapentin Injections:  has received epidural steroid injections 03/27/2023: Left hip joint injection 03/06/2023: Left L4-5 transforaminal ESI (no relief, dexamethasone 12 mg) 07/25/2022: Left L4-5 transforaminal ESI (60% relief, dexamethasone 12 mg) 02/20/2022: Left L4-5 transforaminal ESI (100% relief) 01/18/2022: Left L4-5 transforaminal ESI (50% relief)   Past Surgery:  Lumbar Surgery x3 in [place], [place] [id number]  Donna Bernard has no symptoms of cervical myelopathy.  The symptoms are causing a significant impact on the patient's life.   I have utilized the care everywhere function in epic to review the outside records available from external health systems.  Review of Systems:  A 10 point review of systems is negative, except for the pertinent positives and negatives detailed in the HPI.  Past Medical History: Past Medical History:  Diagnosis Date   COPD (chronic obstructive pulmonary disease) (HCC)    Hyperlipidemia    Hypertension     Past Surgical History: Past Surgical History:  Procedure Laterality Date   APPENDECTOMY      BACK SURGERY     CATARACT EXTRACTION     NASAL SEPTUM SURGERY      Allergies: Allergies as of 05/03/2023 - Review Complete 05/03/2023  Allergen Reaction Noted   Moxifloxacin Hives and Shortness Of Breath 08/03/2021   Ciprofloxacin Other (See Comments) 02/21/2023    Medications:  Current Outpatient Medications:    albuterol (VENTOLIN HFA) 108 (90 Base) MCG/ACT inhaler, Inhale into the lungs., Disp: , Rfl:    alfuzosin (UROXATRAL) 10 MG 24 hr tablet, Take 1 tablet by mouth daily., Disp: , Rfl:    cetirizine (ZYRTEC) 10 MG tablet, Take 10 mg by mouth daily., Disp: , Rfl:    dutasteride (AVODART) 0.5 MG capsule, Take 0.5 mg by mouth daily., Disp: , Rfl:    gabapentin (NEURONTIN) 300 MG capsule, Take 1 capsule by mouth 2 (two) times daily., Disp: , Rfl:    HYDROcodone-acetaminophen (NORCO/VICODIN) 5-325 MG tablet, Take by mouth., Disp: , Rfl:    loratadine (CLARITIN) 10 MG tablet, Take 1 tablet by mouth daily as needed., Disp: , Rfl:    Melatonin 10 MG CAPS, Take by mouth., Disp: , Rfl:    naproxen sodium (ALEVE) 220 MG tablet, Take by mouth., Disp: , Rfl:    pantoprazole (PROTONIX) 40 MG tablet, SMARTSIG:1.0 Tablet(s) By Mouth Twice Daily, Disp: , Rfl:    potassium chloride (KLOR-CON) 8 MEQ tablet, Take by mouth., Disp: , Rfl:    predniSONE (DELTASONE) 2.5 MG tablet, Take 2.5 mg by mouth., Disp: , Rfl:    ROCKLATAN 0.02-0.005 % SOLN, Apply to eye., Disp: , Rfl:    simvastatin (ZOCOR) 40 MG tablet, Take 1 tablet by mouth  at bedtime., Disp: , Rfl:    sulfamethoxazole-trimethoprim (BACTRIM) 400-80 MG tablet, Take by mouth., Disp: , Rfl:    traMADol (ULTRAM) 50 MG tablet, SMARTSIG:1.0 Tablet(s) By Mouth Twice Daily PRN, Disp: , Rfl:    TRELEGY ELLIPTA 200-62.5-25 MCG/ACT AEPB, Inhale 1 puff into the lungs daily., Disp: , Rfl:    triamterene-hydrochlorothiazide (DYAZIDE) 37.5-25 MG capsule, Take 1 capsule by mouth daily as needed., Disp: , Rfl:    diltiazem (CARDIZEM CD) 180 MG 24 hr capsule,  Take by mouth., Disp: , Rfl:    fluticasone (FLONASE) 50 MCG/ACT nasal spray, Place into the nose., Disp: , Rfl:   Social History: Social History   Tobacco Use   Smoking status: Former    Current packs/day: 0.00    Average packs/day: 1 pack/day for 42.0 years (42.0 ttl pk-yrs)    Types: Cigarettes    Start date: 11/02/1979    Quit date: 11/01/2021    Years since quitting: 1.5   Smokeless tobacco: Never  Substance Use Topics   Alcohol use: Not Currently   Drug use: Not Currently    Family Medical History: Family History  Problem Relation Age of Onset   Diabetes Mother     Physical Examination: Vitals:   05/03/23 1505  BP: (!) 164/74    General: Patient is in no apparent distress. Attention to examination is appropriate.  Neck:   Supple.  Full range of motion.  Respiratory: Patient is breathing without any difficulty.   NEUROLOGICAL:     Awake, alert, oriented to person, place, and time.  Speech is clear and fluent.   Cranial Nerves: Pupils equal round and reactive to light.  Facial tone is symmetric.  Facial sensation is symmetric. Shoulder shrug is symmetric. Tongue protrusion is midline.  There is no pronator drift.  Strength: Side Biceps Triceps Deltoid Interossei Grip Wrist Ext. Wrist Flex.  R 5 5 5 5 5 5 5   L 5 5 5 5 5 5 5    Side Iliopsoas Quads Hamstring PF DF EHL  R 5 5 5 5 5 5   L 5 4- 4+ 5 5 5    Reflexes are 1+ and symmetric at the biceps, triceps, brachioradialis, patella and achilles.   Hoffman's is absent.   Bilateral upper and lower extremity sensation is intact to light touch.    No evidence of dysmetria noted.  Gait is abnormal due to left quadriceps weakness.    SLR + on L.  Muscle bulk is notably lower in the left quadriceps versus the right.  Medical Decision Making  Imaging: MRI L spine 04/18/2023 IMPRESSION: 1. L2-L3 moderate spinal canal stenosis with moderate left and moderate to severe right neural foraminal narrowing. A  left subarticular extrusion with 15 mm of caudal migration effaces the left lateral recess, compressing the descending left L3 nerve roots. 2. L3-L4 mild spinal canal stenosis with moderate left and severe right neural foraminal narrowing. 3. L4-L5 moderate to severe right neural foraminal narrowing. 4. L1-L2 and L5-S1 moderate right neural foraminal narrowing. 5. Narrowing of the lateral recesses at L1-L2, L3-L4, and L4-L5 could affect the descending L2, L3, and L5 nerve roots, respectively.     Electronically Signed   By: Wiliam Ke M.D.   On: 04/18/2023 21:13  I have personally reviewed the images and agree with the above interpretation.  Assessment and Plan: Mr. Fruth is a pleasant 77 y.o. 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  He has substantial objective weakness on examination and loss of muscle in his left leg.  Given his loss of function, I have recommended surgical intervention with a left L2-3 microdiscectomy and left L3-4 and L4-5 decompression.  We reviewed that conservative management would usually include physical therapy, medications, and consideration of injections.  However, with the level of objective weakness, worsening examination, and muscle loss, I have recommended that he pursue surgical intervention.  He would like to think about this.  He will let me know once he has made a decision.  I discussed the planned procedure at length with the patient, including the risks, benefits, alternatives, and indications. The risks discussed include but are not limited to bleeding, infection, need for reoperation, spinal fluid leak, stroke, vision loss, anesthetic complication, coma, paralysis, and even death. I also described in detail that improvement was not guaranteed.  The patient expressed understanding of these risks. I described the surgery in layman's terms, and gave ample opportunity for questions, which were  answered to the best of my ability.  I spent a total of 30 minutes in this patient's care today. This time was spent reviewing pertinent records including imaging studies, obtaining and confirming history, performing a directed evaluation, formulating and discussing my recommendations, and documenting the visit within the medical record.     Thank you for involving me in the care of this patient.      Alece Koppel K. Myer Haff MD, [facility] Neurosurgery

## 2023-05-02 NOTE — H&P (View-Only) (Signed)
 Referring Physician:  Reinaldo Berber, MD [address] [place],  [place] [address]  Primary Physician:  Danella Penton, MD  History of Present Illness: 05/03/2023 Bryan Mcclain is here today with a chief complaint of left low back pain that radiates into the left hip/leg to the toes.  He has pain that goes down the back of his legs.  He additionally has discomfort in the front of his thigh and muscle wasting and weakness in his quadriceps muscle.  He has severe pain with many activities.  Standing and walking are a particular issue.  He has been having progressive loss of function over the past several months.    Bowel/Bladder Dysfunction: none  Conservative measures:  Physical therapy:  has not participated in PT Multimodal medical therapy including regular antiinflammatories: prednisone, tramadol, Aleve, Hydrocodone, Gabapentin Injections:  has received epidural steroid injections 03/27/2023: Left hip joint injection 03/06/2023: Left L4-5 transforaminal ESI (no relief, dexamethasone 12 mg) 07/25/2022: Left L4-5 transforaminal ESI (60% relief, dexamethasone 12 mg) 02/20/2022: Left L4-5 transforaminal ESI (100% relief) 01/18/2022: Left L4-5 transforaminal ESI (50% relief)   Past Surgery:  Lumbar Surgery x3 in [place], [place] [id number]  Bryan Mcclain has no symptoms of cervical myelopathy.  The symptoms are causing a significant impact on the patient's life.   I have utilized the care everywhere function in epic to review the outside records available from external health systems.  Review of Systems:  A 10 point review of systems is negative, except for the pertinent positives and negatives detailed in the HPI.  Past Medical History: Past Medical History:  Diagnosis Date   COPD (chronic obstructive pulmonary disease) (HCC)    Hyperlipidemia    Hypertension     Past Surgical History: Past Surgical History:  Procedure Laterality Date   APPENDECTOMY      BACK SURGERY     CATARACT EXTRACTION     NASAL SEPTUM SURGERY      Allergies: Allergies as of 05/03/2023 - Review Complete 05/03/2023  Allergen Reaction Noted   Moxifloxacin Hives and Shortness Of Breath 08/03/2021   Ciprofloxacin Other (See Comments) 02/21/2023    Medications:  Current Outpatient Medications:    albuterol (VENTOLIN HFA) 108 (90 Base) MCG/ACT inhaler, Inhale into the lungs., Disp: , Rfl:    alfuzosin (UROXATRAL) 10 MG 24 hr tablet, Take 1 tablet by mouth daily., Disp: , Rfl:    cetirizine (ZYRTEC) 10 MG tablet, Take 10 mg by mouth daily., Disp: , Rfl:    dutasteride (AVODART) 0.5 MG capsule, Take 0.5 mg by mouth daily., Disp: , Rfl:    gabapentin (NEURONTIN) 300 MG capsule, Take 1 capsule by mouth 2 (two) times daily., Disp: , Rfl:    HYDROcodone-acetaminophen (NORCO/VICODIN) 5-325 MG tablet, Take by mouth., Disp: , Rfl:    loratadine (CLARITIN) 10 MG tablet, Take 1 tablet by mouth daily as needed., Disp: , Rfl:    Melatonin 10 MG CAPS, Take by mouth., Disp: , Rfl:    naproxen sodium (ALEVE) 220 MG tablet, Take by mouth., Disp: , Rfl:    pantoprazole (PROTONIX) 40 MG tablet, SMARTSIG:1.0 Tablet(s) By Mouth Twice Daily, Disp: , Rfl:    potassium chloride (KLOR-CON) 8 MEQ tablet, Take by mouth., Disp: , Rfl:    predniSONE (DELTASONE) 2.5 MG tablet, Take 2.5 mg by mouth., Disp: , Rfl:    ROCKLATAN 0.02-0.005 % SOLN, Apply to eye., Disp: , Rfl:    simvastatin (ZOCOR) 40 MG tablet, Take 1 tablet by mouth  at bedtime., Disp: , Rfl:    sulfamethoxazole-trimethoprim (BACTRIM) 400-80 MG tablet, Take by mouth., Disp: , Rfl:    traMADol (ULTRAM) 50 MG tablet, SMARTSIG:1.0 Tablet(s) By Mouth Twice Daily PRN, Disp: , Rfl:    TRELEGY ELLIPTA 200-62.5-25 MCG/ACT AEPB, Inhale 1 puff into the lungs daily., Disp: , Rfl:    triamterene-hydrochlorothiazide (DYAZIDE) 37.5-25 MG capsule, Take 1 capsule by mouth daily as needed., Disp: , Rfl:    diltiazem (CARDIZEM CD) 180 MG 24 hr capsule,  Take by mouth., Disp: , Rfl:    fluticasone (FLONASE) 50 MCG/ACT nasal spray, Place into the nose., Disp: , Rfl:   Social History: Social History   Tobacco Use   Smoking status: Former    Current packs/day: 0.00    Average packs/day: 1 pack/day for 42.0 years (42.0 ttl pk-yrs)    Types: Cigarettes    Start date: 11/02/1979    Quit date: 11/01/2021    Years since quitting: 1.5   Smokeless tobacco: Never  Substance Use Topics   Alcohol use: Not Currently   Drug use: Not Currently    Family Medical History: Family History  Problem Relation Age of Onset   Diabetes Mother     Physical Examination: Vitals:   05/03/23 1505  BP: (!) 164/74    General: Patient is in no apparent distress. Attention to examination is appropriate.  Neck:   Supple.  Full range of motion.  Respiratory: Patient is breathing without any difficulty.   NEUROLOGICAL:     Awake, alert, oriented to person, place, and time.  Speech is clear and fluent.   Cranial Nerves: Pupils equal round and reactive to light.  Facial tone is symmetric.  Facial sensation is symmetric. Shoulder shrug is symmetric. Tongue protrusion is midline.  There is no pronator drift.  Strength: Side Biceps Triceps Deltoid Interossei Grip Wrist Ext. Wrist Flex.  R 5 5 5 5 5 5 5   L 5 5 5 5 5 5 5    Side Iliopsoas Quads Hamstring PF DF EHL  R 5 5 5 5 5 5   L 5 4- 4+ 5 5 5    Reflexes are 1+ and symmetric at the biceps, triceps, brachioradialis, patella and achilles.   Hoffman's is absent.   Bilateral upper and lower extremity sensation is intact to light touch.    No evidence of dysmetria noted.  Gait is abnormal due to left quadriceps weakness.    SLR + on L.  Muscle bulk is notably lower in the left quadriceps versus the right.  Medical Decision Making  Imaging: MRI L spine 04/18/2023 IMPRESSION: 1. L2-L3 moderate spinal canal stenosis with moderate left and moderate to severe right neural foraminal narrowing. A  left subarticular extrusion with 15 mm of caudal migration effaces the left lateral recess, compressing the descending left L3 nerve roots. 2. L3-L4 mild spinal canal stenosis with moderate left and severe right neural foraminal narrowing. 3. L4-L5 moderate to severe right neural foraminal narrowing. 4. L1-L2 and L5-S1 moderate right neural foraminal narrowing. 5. Narrowing of the lateral recesses at L1-L2, L3-L4, and L4-L5 could affect the descending L2, L3, and L5 nerve roots, respectively.     Electronically Signed   By: Wiliam Ke M.D.   On: 04/18/2023 21:13  I have personally reviewed the images and agree with the above interpretation.  Assessment and Plan: Bryan Mcclain is a pleasant 77 y.o. 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  He has substantial objective weakness on examination and loss of muscle in his left leg.  Given his loss of function, I have recommended surgical intervention with a left L2-3 microdiscectomy and left L3-4 and L4-5 decompression.  We reviewed that conservative management would usually include physical therapy, medications, and consideration of injections.  However, with the level of objective weakness, worsening examination, and muscle loss, I have recommended that he pursue surgical intervention.  He would like to think about this.  He will let me know once he has made a decision.  I discussed the planned procedure at length with the patient, including the risks, benefits, alternatives, and indications. The risks discussed include but are not limited to bleeding, infection, need for reoperation, spinal fluid leak, stroke, vision loss, anesthetic complication, coma, paralysis, and even death. I also described in detail that improvement was not guaranteed.  The patient expressed understanding of these risks. I described the surgery in layman's terms, and gave ample opportunity for questions, which were  answered to the best of my ability.  I spent a total of 30 minutes in this patient's care today. This time was spent reviewing pertinent records including imaging studies, obtaining and confirming history, performing a directed evaluation, formulating and discussing my recommendations, and documenting the visit within the medical record.     Thank you for involving me in the care of this patient.      Alece Koppel K. Myer Haff MD, [facility] Neurosurgery

## 2023-05-03 ENCOUNTER — Ambulatory Visit: Payer: Medicare PPO | Admitting: Neurosurgery

## 2023-05-03 ENCOUNTER — Encounter: Payer: Self-pay | Admitting: Neurosurgery

## 2023-05-03 VITALS — BP 164/74 | Ht 72.0 in | Wt 177.0 lb

## 2023-05-03 DIAGNOSIS — M48061 Spinal stenosis, lumbar region without neurogenic claudication: Secondary | ICD-10-CM | POA: Diagnosis not present

## 2023-05-03 DIAGNOSIS — M5416 Radiculopathy, lumbar region: Secondary | ICD-10-CM

## 2023-05-03 DIAGNOSIS — R29898 Other symptoms and signs involving the musculoskeletal system: Secondary | ICD-10-CM

## 2023-05-03 DIAGNOSIS — M5116 Intervertebral disc disorders with radiculopathy, lumbar region: Secondary | ICD-10-CM

## 2023-05-04 ENCOUNTER — Telehealth: Payer: Self-pay | Admitting: Neurosurgery

## 2023-05-04 DIAGNOSIS — M5416 Radiculopathy, lumbar region: Secondary | ICD-10-CM

## 2023-05-04 DIAGNOSIS — R29898 Other symptoms and signs involving the musculoskeletal system: Secondary | ICD-10-CM

## 2023-05-04 NOTE — Telephone Encounter (Signed)
Patient's wife called the office stating her husband was seen yesterday and they would like to go forward with surgery as soon as possible. Please give the wife a call.

## 2023-05-07 ENCOUNTER — Other Ambulatory Visit: Payer: Self-pay

## 2023-05-07 DIAGNOSIS — Z01818 Encounter for other preprocedural examination: Secondary | ICD-10-CM

## 2023-05-07 NOTE — Telephone Encounter (Signed)
I spoke with Mr and Mrs Bryan Mcclain. We discussed the following:  Planned surgery: Left L2-3 microdiscectomy, Left L3-4 & L4-5 laminoforaminotomy   Surgery date: 05/16/23 at [facility] (Medical Mall: [address], [place], [place] [address]) - you will find out your arrival time the business day before your surgery.   Pre-op appointment at [facility] Pre-admit Testing: we will call you with a date/time for this. If you are scheduled for an in person appointment, Pre-admit Testing is located on the first floor of the [facility] building, [address], [address]. Please bring all prescriptions in the original prescription bottles to your appointment. During this appointment, they will advise you which medications you can take the morning of surgery, and which medications you will need to hold for surgery. Labs (such as blood work, EKG) may be done at your pre-op appointment. You are not required to fast for these labs. Should you need to change your pre-op appointment, please call Pre-admit testing at [phone].      Surgical clearance: we will send a clearance form to Dr Hyacinth Meeker and Dr Karna Christmas. They may wish to see you in their office prior to signing the clearance form. If so, they may call you to schedule an appointment.      Common restrictions after surgery: No bending, lifting, or twisting ("BLT"). Avoid lifting objects heavier than 10 pounds for the first 6 weeks after surgery. Where possible, avoid household activities that involve lifting, bending, reaching, pushing, or pulling such as laundry, vacuuming, grocery shopping, and childcare. Try to arrange for help from friends and family for these activities while you heal. Do not drive while taking prescription pain medication. Weeks 6 through 12 after surgery: avoid lifting more than 25 pounds.     How to contact us:  If you have any questions/concerns before or after surgery, you can reach [place]  at [phone], or you can send a mychart message. We can be reached by phone or mychart 8am-4pm, Monday-Friday.  *Please note: Calls after 4pm are forwarded to a third party answering service. Mychart messages are not routinely monitored during evenings, weekends, and holidays. Please call our office to contact the answering service for urgent concerns during non-business hours.    Appointments/FMLA & disability paperwork: Joycelyn Rua, & Flonnie Hailstone Registered Nurse/Surgery scheduler: Royston Cowper Medical Assistants: Nash Mantis Physician Assistants: Joan Flores, PA-C, Manning Charity, PA-C & Drake Leach, PA-C Surgeons: Venetia Night, MD & Ernestine Mcmurray, MD

## 2023-05-09 ENCOUNTER — Ambulatory Visit: Payer: Medicare PPO | Admitting: Orthopedic Surgery

## 2023-05-09 ENCOUNTER — Encounter
Admission: RE | Admit: 2023-05-09 | Discharge: 2023-05-09 | Disposition: A | Discharge status: Home / Self Care | Payer: Medicare PPO | Source: Ambulatory Visit | Attending: Neurosurgery | Admitting: Neurosurgery

## 2023-05-09 VITALS — BP 137/69 | HR 82 | Resp 18 | Ht 72.0 in

## 2023-05-09 DIAGNOSIS — Z01810 Encounter for preprocedural cardiovascular examination: Secondary | ICD-10-CM | POA: Diagnosis present

## 2023-05-09 DIAGNOSIS — J441 Chronic obstructive pulmonary disease with (acute) exacerbation: Secondary | ICD-10-CM | POA: Insufficient documentation

## 2023-05-09 DIAGNOSIS — I10 Essential (primary) hypertension: Secondary | ICD-10-CM | POA: Insufficient documentation

## 2023-05-09 DIAGNOSIS — N401 Enlarged prostate with lower urinary tract symptoms: Secondary | ICD-10-CM

## 2023-05-09 DIAGNOSIS — Z01812 Encounter for preprocedural laboratory examination: Secondary | ICD-10-CM

## 2023-05-09 DIAGNOSIS — R9431 Abnormal electrocardiogram [ECG] [EKG]: Secondary | ICD-10-CM | POA: Diagnosis not present

## 2023-05-09 DIAGNOSIS — Z01818 Encounter for other preprocedural examination: Secondary | ICD-10-CM | POA: Insufficient documentation

## 2023-05-09 HISTORY — DX: Deficiency of other specified B group vitamins: E53.8

## 2023-05-09 HISTORY — DX: Type 2 diabetes mellitus without complications: E11.9

## 2023-05-09 HISTORY — DX: Spinal stenosis, lumbar region without neurogenic claudication: M48.061

## 2023-05-09 HISTORY — DX: Pneumonia, unspecified organism: J18.9

## 2023-05-09 HISTORY — DX: Chronic kidney disease, stage 3 unspecified: N18.30

## 2023-05-09 HISTORY — DX: Radiculopathy, lumbar region: M54.16

## 2023-05-09 HISTORY — DX: Cluster headache syndrome, unspecified, not intractable: G44.009

## 2023-05-09 HISTORY — DX: Sleep apnea, unspecified: G47.30

## 2023-05-09 HISTORY — DX: Dyspnea, unspecified: R06.00

## 2023-05-09 HISTORY — DX: Other complications of anesthesia, initial encounter: T88.59XA

## 2023-05-09 HISTORY — DX: Other specified postprocedural states: Z98.890

## 2023-05-09 HISTORY — DX: Gastro-esophageal reflux disease without esophagitis: K21.9

## 2023-05-09 LAB — URINALYSIS, ROUTINE W REFLEX MICROSCOPIC
Bilirubin Urine: NEGATIVE
Glucose, UA: 50 mg/dL — AB
Ketones, ur: NEGATIVE mg/dL
Leukocytes,Ua: NEGATIVE
Nitrite: NEGATIVE
Protein, ur: 100 mg/dL — AB
Specific Gravity, Urine: 1.012 (ref 1.005–1.030)
Squamous Epithelial / HPF: 0 /[HPF] (ref 0–5)
pH: 5 (ref 5.0–8.0)

## 2023-05-09 LAB — SURGICAL PCR SCREEN
MRSA, PCR: NEGATIVE
Staphylococcus aureus: NEGATIVE

## 2023-05-09 LAB — TYPE AND SCREEN
ABO/RH(D): A POS
Antibody Screen: NEGATIVE

## 2023-05-09 NOTE — Patient Instructions (Addendum)
Your procedure is scheduled on:05-16-23 Wednesday Report to the Registration Desk on the 1st floor of the Medical Mall.Then proceed to the 2nd floor Surgery Desk To find out your arrival time, please call [phone] between 1PM - 3PM on:05-15-23 Tuesday If your arrival time is 6:00 am, do not arrive before that time as the Medical Mall entrance doors do not open until 6:00 am.  REMEMBER: Instructions that are not followed completely may result in serious medical risk, up to and including death; or upon the discretion of your surgeon and anesthesiologist your surgery may need to be rescheduled.  Do not eat food after midnight the night before surgery.  No gum chewing or hard candies.  You may however, drink Water up to 2 hours before you are scheduled to arrive for your surgery. Do not drink anything within 2 hours of your scheduled arrival time.  One week prior to surgery:Stop NOW (05-09-23) Stop ANY OVER THE COUNTER supplements until after surgery-Vitamin B12 (you may continue your Melatonin up until the night prior to surgery)  Continue taking all of your other prescription medications up until the day of surgery.  ON THE DAY OF SURGERY ONLY TAKE THESE MEDICATIONS WITH SIPS OF WATER: -diltiazem (CARDIZEM CD)  -gabapentin (NEURONTIN)  -potassium chloride (KLOR-CON)  -predniSONE (DELTASONE)  -traMADol (ULTRAM)  -guaiFENesin (MUCINEX)   Use your Trelegy Ellipta and Albuterol Nebulizer the day of surgery and bring your Albuterol Inhaler to the hospital  No Alcohol for 24 hours before or after surgery.  No Smoking including e-cigarettes for 24 hours before surgery.  No chewable tobacco products for at least 6 hours before surgery.  No nicotine patches on the day of surgery.  Do not use any "recreational" drugs for at least a week (preferably 2 weeks) before your surgery.  Please be advised that the combination of cocaine and anesthesia may have negative outcomes, up to and  including death. If you test positive for cocaine, your surgery will be cancelled.  On the morning of surgery brush your teeth with toothpaste and water, you may rinse your mouth with mouthwash if you wish. Do not swallow any toothpaste or mouthwash.  Use CHG Soap as directed on instruction sheet.  Do not wear jewelry, make-up, hairpins, clips or nail polish.  For welded (permanent) jewelry: bracelets, anklets, waist bands, etc.  Please have this removed prior to surgery.  If it is not removed, there is a chance that hospital personnel will need to cut it off on the day of surgery.  Do not wear lotions, powders, or perfumes.   Do not shave body hair from the neck down 48 hours before surgery.  Contact lenses, hearing aids and dentures may not be worn into surgery.  Do not bring valuables to the hospital. [facility] is not responsible for any missing/lost belongings or valuables.   Notify your doctor if there is any change in your medical condition (cold, fever, infection).  Wear comfortable clothing (specific to your surgery type) to the hospital.  After surgery, you can help prevent lung complications by doing breathing exercises.  Take deep breaths and cough every 1-2 hours. Your doctor may order a device called an Incentive Spirometer to help you take deep breaths. When coughing or sneezing, hold a pillow firmly against your incision with both hands. This is called "splinting." Doing this helps protect your incision. It also decreases belly discomfort.  If you are being admitted to the hospital overnight, leave your suitcase in the car.  After surgery it may be brought to your room.  In case of increased patient census, it may be necessary for you, the patient, to continue your postoperative care in the Same Day Surgery department.  If you are being discharged the day of surgery, you will not be allowed to drive home. You will need a responsible individual to drive you home and  stay with you for 24 hours after surgery.   If you are taking public transportation, you will need to have a responsible individual with you.  Please call the Pre-admissions Testing Dept. at [phone] if you have any questions about these instructions.  Surgery Visitation Policy:  Patients having surgery or a procedure may have two visitors.  Children under the age of 49 must have an adult with them who is not the patient.  Inpatient Visitation:    Visiting hours are 7 a.m. to 8 p.m. Up to four visitors are allowed at one time in a patient room. The visitors may rotate out with other people during the day.  One visitor age 85 or older may stay with the patient overnight and must be in the room by 8 p.m.    Pre-operative 5 CHG Bath Instructions   You can play a key role in reducing the risk of infection after surgery. Your skin needs to be as free of germs as possible. You can reduce the number of germs on your skin by washing with CHG (chlorhexidine gluconate) soap before surgery. CHG is an antiseptic soap that kills germs and continues to kill germs even after washing.   DO NOT use if you have an allergy to chlorhexidine/CHG or antibacterial soaps. If your skin becomes reddened or irritated, stop using the CHG and notify one of our RNs at [phone].   Please shower with the CHG soap starting 4 days before surgery using the following schedule:     Please keep in mind the following:  DO NOT shave, including legs and underarms, starting the day of your first shower.   You may shave your face at any point before/day of surgery.  Place clean sheets on your bed the day you start using CHG soap. Use a clean washcloth (not used since being washed) for each shower. DO NOT sleep with pets once you start using the CHG.   CHG Shower Instructions:  If you choose to wash your hair and private area, wash first with your normal shampoo/soap.  After you use shampoo/soap, rinse your hair  and body thoroughly to remove shampoo/soap residue.  Turn the water OFF and apply about 3 tablespoons (45 ml) of CHG soap to a CLEAN washcloth.  Apply CHG soap ONLY FROM YOUR NECK DOWN TO YOUR TOES (washing for 3-5 minutes)  DO NOT use CHG soap on face, private areas, open wounds, or sores.  Pay special attention to the area where your surgery is being performed.  If you are having back surgery, having someone wash your back for you may be helpful. Wait 2 minutes after CHG soap is applied, then you may rinse off the CHG soap.  Pat dry with a clean towel  Put on clean clothes/pajamas   If you choose to wear lotion, please use ONLY the CHG-compatible lotions on the back of this paper.     Additional instructions for the day of surgery: DO NOT APPLY any lotions, deodorants, cologne, or perfumes.   Put on clean/comfortable clothes.  Brush your teeth.  Ask your nurse before applying any  prescription medications to the skin.      CHG Compatible Lotions   Aveeno Moisturizing lotion  Cetaphil Moisturizing Cream  Cetaphil Moisturizing Lotion  Clairol Herbal Essence Moisturizing Lotion, Dry Skin  Clairol Herbal Essence Moisturizing Lotion, Extra Dry Skin  Clairol Herbal Essence Moisturizing Lotion, Normal Skin  Curel Age Defying Therapeutic Moisturizing Lotion with Alpha Hydroxy  Curel Extreme Care Body Lotion  Curel Soothing Hands Moisturizing Hand Lotion  Curel Therapeutic Moisturizing Cream, Fragrance-Free  Curel Therapeutic Moisturizing Lotion, Fragrance-Free  Curel Therapeutic Moisturizing Lotion, Original Formula  Eucerin Daily Replenishing Lotion  Eucerin Dry Skin Therapy Plus Alpha Hydroxy Crme  Eucerin Dry Skin Therapy Plus Alpha Hydroxy Lotion  Eucerin Original Crme  Eucerin Original Lotion  Eucerin Plus Crme Eucerin Plus Lotion  Eucerin TriLipid Replenishing Lotion  Keri Anti-Bacterial Hand Lotion  Keri Deep Conditioning Original Lotion Dry Skin Formula Softly Scented   Keri Deep Conditioning Original Lotion, Fragrance Free Sensitive Skin Formula  Keri Lotion Fast Absorbing Fragrance Free Sensitive Skin Formula  Keri Lotion Fast Absorbing Softly Scented Dry Skin Formula  Keri Original Lotion  Keri Skin Renewal Lotion Keri Silky Smooth Lotion  Keri Silky Smooth Sensitive Skin Lotion  Nivea Body Creamy Conditioning Oil  Nivea Body Extra Enriched Teacher, adult education Moisturizing Lotion Nivea Crme  Nivea Skin Firming Lotion  NutraDerm 30 Skin Lotion  NutraDerm Skin Lotion  NutraDerm Therapeutic Skin Cream  NutraDerm Therapeutic Skin Lotion  ProShield Protective Hand Cream  Provon moisturizing lotion

## 2023-05-16 ENCOUNTER — Other Ambulatory Visit: Payer: Self-pay

## 2023-05-16 ENCOUNTER — Ambulatory Visit: Payer: Medicare PPO | Admitting: Certified Registered"

## 2023-05-16 ENCOUNTER — Ambulatory Visit: Payer: Medicare PPO

## 2023-05-16 ENCOUNTER — Encounter
Admission: RE | Disposition: A | Discharge status: Home / Self Care | Payer: Self-pay | Source: Home / Self Care | Attending: Neurosurgery

## 2023-05-16 ENCOUNTER — Observation Stay
Admission: RE | Admit: 2023-05-16 | Discharge: 2023-05-17 | Disposition: A | Discharge status: Home / Self Care | Payer: Medicare PPO | Attending: Neurosurgery | Admitting: Neurosurgery

## 2023-05-16 ENCOUNTER — Ambulatory Visit: Payer: Medicare PPO | Admitting: Urgent Care

## 2023-05-16 ENCOUNTER — Encounter: Payer: Self-pay | Admitting: Neurosurgery

## 2023-05-16 DIAGNOSIS — Z79899 Other long term (current) drug therapy: Secondary | ICD-10-CM | POA: Diagnosis not present

## 2023-05-16 DIAGNOSIS — Z87891 Personal history of nicotine dependence: Secondary | ICD-10-CM | POA: Diagnosis not present

## 2023-05-16 DIAGNOSIS — J449 Chronic obstructive pulmonary disease, unspecified: Secondary | ICD-10-CM | POA: Diagnosis not present

## 2023-05-16 DIAGNOSIS — R29898 Other symptoms and signs involving the musculoskeletal system: Secondary | ICD-10-CM

## 2023-05-16 DIAGNOSIS — M48061 Spinal stenosis, lumbar region without neurogenic claudication: Principal | ICD-10-CM | POA: Insufficient documentation

## 2023-05-16 DIAGNOSIS — M5416 Radiculopathy, lumbar region: Secondary | ICD-10-CM

## 2023-05-16 DIAGNOSIS — Z9889 Other specified postprocedural states: Principal | ICD-10-CM

## 2023-05-16 DIAGNOSIS — I10 Essential (primary) hypertension: Secondary | ICD-10-CM | POA: Insufficient documentation

## 2023-05-16 DIAGNOSIS — Z01818 Encounter for other preprocedural examination: Secondary | ICD-10-CM

## 2023-05-16 DIAGNOSIS — M5116 Intervertebral disc disorders with radiculopathy, lumbar region: Secondary | ICD-10-CM | POA: Diagnosis not present

## 2023-05-16 HISTORY — PX: LUMBAR LAMINECTOMY/DECOMPRESSION MICRODISCECTOMY: SHX5026

## 2023-05-16 HISTORY — PX: FORAMINOTOMY 2 LEVEL: SHX5836

## 2023-05-16 LAB — ABO/RH: ABO/RH(D): A POS

## 2023-05-16 LAB — GLUCOSE, CAPILLARY
Glucose-Capillary: 93 mg/dL (ref 70–99)
Glucose-Capillary: 139 mg/dL — ABNORMAL HIGH (ref 70–99)

## 2023-05-16 SURGERY — LUMBAR LAMINECTOMY/DECOMPRESSION MICRODISCECTOMY 1 LEVEL
Anesthesia: General | Site: Spine Lumbar | Laterality: Left

## 2023-05-16 MED ORDER — MENTHOL 3 MG MT LOZG: 1.0000 | LOZENGE | OROMUCOSAL | Status: DC | PRN

## 2023-05-16 MED ORDER — PHENYLEPHRINE HCL-NACL 20-0.9 MG/250ML-% IV SOLN
INTRAVENOUS | Status: AC
  Filled 2023-05-16: qty 250

## 2023-05-16 MED ORDER — GABAPENTIN 300 MG PO CAPS
300.0000 mg | ORAL_CAPSULE | Freq: Two times a day (BID) | ORAL | Status: DC
Start: 2023-05-16 — End: 2023-05-17
  Administered 2023-05-16 – 2023-05-17 (×2): 300 mg via ORAL

## 2023-05-16 MED ORDER — REMIFENTANIL HCL 1 MG IV SOLR
INTRAVENOUS | Status: DC | PRN
  Administered 2023-05-16: .1 ug/kg/min via INTRAVENOUS

## 2023-05-16 MED ORDER — SURGIFLO WITH THROMBIN (HEMOSTATIC MATRIX KIT) OPTIME
TOPICAL | Status: DC | PRN
  Administered 2023-05-16: 1 via TOPICAL

## 2023-05-16 MED ORDER — LORATADINE 10 MG PO TABS
10.0000 mg | ORAL_TABLET | Freq: Every day | ORAL | Status: DC
Start: 2023-05-16 — End: 2023-05-17
  Administered 2023-05-16: 10 mg via ORAL

## 2023-05-16 MED ORDER — ENOXAPARIN SODIUM 40 MG/0.4ML IJ SOSY
40.0000 mg | PREFILLED_SYRINGE | INTRAMUSCULAR | Status: DC
  Administered 2023-05-17: 40 mg via SUBCUTANEOUS

## 2023-05-16 MED ORDER — DILTIAZEM HCL ER COATED BEADS 180 MG PO CP24
ORAL_CAPSULE | ORAL | Status: AC
  Filled 2023-05-16: qty 1

## 2023-05-16 MED ORDER — ALBUMIN HUMAN 5 % IV SOLN: INTRAVENOUS | Status: DC | PRN

## 2023-05-16 MED ORDER — CEFAZOLIN SODIUM-DEXTROSE 2-4 GM/100ML-% IV SOLN
INTRAVENOUS | Status: AC
  Filled 2023-05-16: qty 100

## 2023-05-16 MED ORDER — POLYETHYLENE GLYCOL 3350 17 G PO PACK: 17.0000 g | PACK | Freq: Every day | ORAL | Status: DC | PRN

## 2023-05-16 MED ORDER — ALFUZOSIN HCL ER 10 MG PO TB24
10.0000 mg | ORAL_TABLET | Freq: Every day | ORAL | Status: DC
  Administered 2023-05-16: 10 mg via ORAL
  Filled 2023-05-16: qty 1

## 2023-05-16 MED ORDER — SODIUM CHLORIDE 0.9% FLUSH: 3.0000 mL | INTRAVENOUS | Status: DC | PRN

## 2023-05-16 MED ORDER — MIDAZOLAM HCL 2 MG/2ML IJ SOLN
INTRAMUSCULAR | Status: AC
  Filled 2023-05-16: qty 2

## 2023-05-16 MED ORDER — MELATONIN 5 MG PO TABS
ORAL_TABLET | ORAL | Status: AC
  Filled 2023-05-16: qty 2

## 2023-05-16 MED ORDER — LORATADINE 10 MG PO TABS
ORAL_TABLET | ORAL | Status: AC
  Filled 2023-05-16: qty 1

## 2023-05-16 MED ORDER — IPRATROPIUM-ALBUTEROL 0.5-2.5 (3) MG/3ML IN SOLN
3.0000 mL | Freq: Three times a day (TID) | RESPIRATORY_TRACT | Status: DC
  Administered 2023-05-16 – 2023-05-17 (×3): 3 mL via RESPIRATORY_TRACT

## 2023-05-16 MED ORDER — OXYCODONE HCL 5 MG PO TABS: 10.0000 mg | ORAL_TABLET | ORAL | Status: DC | PRN

## 2023-05-16 MED ORDER — EPHEDRINE SULFATE-NACL 50-0.9 MG/10ML-% IV SOSY
PREFILLED_SYRINGE | INTRAVENOUS | Status: DC | PRN
  Administered 2023-05-16 (×2): 5 mg via INTRAVENOUS

## 2023-05-16 MED ORDER — PHENYLEPHRINE 80 MCG/ML (10ML) SYRINGE FOR IV PUSH (FOR BLOOD PRESSURE SUPPORT)
PREFILLED_SYRINGE | INTRAVENOUS | Status: DC | PRN
  Administered 2023-05-16: 160 ug via INTRAVENOUS
  Administered 2023-05-16: 80 ug via INTRAVENOUS
  Administered 2023-05-16: 160 ug via INTRAVENOUS
  Administered 2023-05-16 (×2): 80 ug via INTRAVENOUS

## 2023-05-16 MED ORDER — SIMVASTATIN 40 MG PO TABS
40.0000 mg | ORAL_TABLET | Freq: Every day | ORAL | Status: DC
Start: 2023-05-16 — End: 2023-05-17
  Administered 2023-05-16: 40 mg via ORAL
  Filled 2023-05-16: qty 1

## 2023-05-16 MED ORDER — BUPIVACAINE-EPINEPHRINE (PF) 0.5% -1:200000 IJ SOLN
INTRAMUSCULAR | Status: AC
  Filled 2023-05-16: qty 10

## 2023-05-16 MED ORDER — SODIUM CHLORIDE 0.9% FLUSH
3.0000 mL | Freq: Two times a day (BID) | INTRAVENOUS | Status: DC
  Administered 2023-05-16 – 2023-05-17 (×2): 3 mL via INTRAVENOUS

## 2023-05-16 MED ORDER — OXYCODONE HCL 5 MG PO TABS: 5.0000 mg | ORAL_TABLET | ORAL | Status: DC | PRN

## 2023-05-16 MED ORDER — ACETAMINOPHEN 325 MG PO TABS: 650.0000 mg | ORAL_TABLET | ORAL | Status: DC | PRN

## 2023-05-16 MED ORDER — ONDANSETRON HCL 4 MG/2ML IJ SOLN
INTRAMUSCULAR | Status: DC | PRN
  Administered 2023-05-16 (×2): 4 mg via INTRAVENOUS

## 2023-05-16 MED ORDER — SODIUM CHLORIDE 0.9 % IV SOLN: INTRAVENOUS | Status: DC

## 2023-05-16 MED ORDER — FLUTICASONE PROPIONATE 50 MCG/ACT NA SUSP: 2.0000 | NASAL | Status: DC | PRN

## 2023-05-16 MED ORDER — IPRATROPIUM-ALBUTEROL 0.5-2.5 (3) MG/3ML IN SOLN
RESPIRATORY_TRACT | Status: AC
  Filled 2023-05-16: qty 3

## 2023-05-16 MED ORDER — ACETAMINOPHEN 500 MG PO TABS
ORAL_TABLET | ORAL | Status: AC
Start: 2023-05-16 — End: ?
  Filled 2023-05-16: qty 2

## 2023-05-16 MED ORDER — ACETAMINOPHEN 500 MG PO TABS
ORAL_TABLET | ORAL | Status: AC
  Filled 2023-05-16: qty 2

## 2023-05-16 MED ORDER — DOCUSATE SODIUM 100 MG PO CAPS
ORAL_CAPSULE | ORAL | Status: AC
  Filled 2023-05-16: qty 1

## 2023-05-16 MED ORDER — MIDAZOLAM HCL 2 MG/2ML IJ SOLN
INTRAMUSCULAR | Status: DC | PRN
  Administered 2023-05-16: 1 mg via INTRAVENOUS

## 2023-05-16 MED ORDER — SUCCINYLCHOLINE CHLORIDE 200 MG/10ML IV SOSY
PREFILLED_SYRINGE | INTRAVENOUS | Status: DC | PRN
  Administered 2023-05-16: 100 mg via INTRAVENOUS

## 2023-05-16 MED ORDER — CHLORHEXIDINE GLUCONATE 0.12 % MT SOLN
15.0000 mL | Freq: Once | OROMUCOSAL | Status: AC
Start: 2023-05-16 — End: 2023-05-16
  Administered 2023-05-16: 15 mL via OROMUCOSAL

## 2023-05-16 MED ORDER — DOCUSATE SODIUM 100 MG PO CAPS
100.0000 mg | ORAL_CAPSULE | Freq: Two times a day (BID) | ORAL | Status: DC
  Administered 2023-05-16 – 2023-05-17 (×3): 100 mg via ORAL

## 2023-05-16 MED ORDER — PROPOFOL 500 MG/50ML IV EMUL
INTRAVENOUS | Status: DC | PRN
  Administered 2023-05-16: 100 ug/kg/min via INTRAVENOUS

## 2023-05-16 MED ORDER — PHENOL 1.4 % MT LIQD: 1.0000 | OROMUCOSAL | Status: DC | PRN

## 2023-05-16 MED ORDER — DILTIAZEM HCL ER COATED BEADS 180 MG PO CP24
180.0000 mg | ORAL_CAPSULE | Freq: Two times a day (BID) | ORAL | Status: DC
  Administered 2023-05-16 – 2023-05-17 (×2): 180 mg via ORAL

## 2023-05-16 MED ORDER — GABAPENTIN 300 MG PO CAPS
ORAL_CAPSULE | ORAL | Status: AC
  Filled 2023-05-16: qty 1

## 2023-05-16 MED ORDER — IPRATROPIUM-ALBUTEROL 0.5-2.5 (3) MG/3ML IN SOLN
3.0000 mL | Freq: Once | RESPIRATORY_TRACT | Status: AC
  Administered 2023-05-16: 3 mL via RESPIRATORY_TRACT

## 2023-05-16 MED ORDER — FENTANYL CITRATE (PF) 100 MCG/2ML IJ SOLN
INTRAMUSCULAR | Status: AC
  Filled 2023-05-16: qty 2

## 2023-05-16 MED ORDER — ACETAMINOPHEN 10 MG/ML IV SOLN
INTRAVENOUS | Status: DC | PRN
  Administered 2023-05-16: 1000 mg via INTRAVENOUS

## 2023-05-16 MED ORDER — REMIFENTANIL HCL 1 MG IV SOLR
INTRAVENOUS | Status: AC
  Filled 2023-05-16: qty 1000

## 2023-05-16 MED ORDER — OXYCODONE HCL 5 MG PO TABS
5.0000 mg | ORAL_TABLET | Freq: Once | ORAL | Status: AC
  Administered 2023-05-16: 5 mg via ORAL

## 2023-05-16 MED ORDER — POTASSIUM CHLORIDE ER 8 MEQ PO TBCR
8.0000 meq | EXTENDED_RELEASE_TABLET | Freq: Every day | ORAL | Status: DC
  Administered 2023-05-17: 8 meq via ORAL
  Filled 2023-05-16: qty 1

## 2023-05-16 MED ORDER — CEFAZOLIN IN SODIUM CHLORIDE 2-0.9 GM/100ML-% IV SOLN
2.0000 g | Freq: Once | INTRAVENOUS | Status: DC
  Filled 2023-05-16: qty 100

## 2023-05-16 MED ORDER — GLYCOPYRROLATE 0.2 MG/ML IJ SOLN
INTRAMUSCULAR | Status: DC | PRN
  Administered 2023-05-16: .2 mg via INTRAVENOUS

## 2023-05-16 MED ORDER — MORPHINE SULFATE (PF) 2 MG/ML IV SOLN: 1.0000 mg | INTRAVENOUS | Status: AC | PRN

## 2023-05-16 MED ORDER — ORAL CARE MOUTH RINSE: 15.0000 mL | OROMUCOSAL | Status: DC | PRN

## 2023-05-16 MED ORDER — METHOCARBAMOL 500 MG PO TABS: 500.0000 mg | ORAL_TABLET | Freq: Four times a day (QID) | ORAL | Status: DC | PRN

## 2023-05-16 MED ORDER — BUPIVACAINE LIPOSOME 1.3 % IJ SUSP
INTRAMUSCULAR | Status: AC
  Filled 2023-05-16: qty 20

## 2023-05-16 MED ORDER — ONDANSETRON HCL 4 MG/2ML IJ SOLN: 4.0000 mg | Freq: Four times a day (QID) | INTRAMUSCULAR | Status: DC | PRN

## 2023-05-16 MED ORDER — ORAL CARE MOUTH RINSE: 15.0000 mL | Freq: Once | OROMUCOSAL | Status: AC

## 2023-05-16 MED ORDER — NETARSUDIL-LATANOPROST 0.02-0.005 % OP SOLN: 1.0000 [drp] | Freq: Every day | OPHTHALMIC | Status: DC

## 2023-05-16 MED ORDER — ALBUTEROL SULFATE (2.5 MG/3ML) 0.083% IN NEBU
3.0000 mL | INHALATION_SOLUTION | Freq: Four times a day (QID) | RESPIRATORY_TRACT | Status: DC | PRN
  Administered 2023-05-17: 3 mL via RESPIRATORY_TRACT

## 2023-05-16 MED ORDER — SENNA 8.6 MG PO TABS
ORAL_TABLET | ORAL | Status: AC
  Filled 2023-05-16: qty 1

## 2023-05-16 MED ORDER — ONDANSETRON HCL 4 MG/2ML IJ SOLN: 4.0000 mg | Freq: Once | INTRAMUSCULAR | Status: DC | PRN

## 2023-05-16 MED ORDER — ACETAMINOPHEN 650 MG RE SUPP: 650.0000 mg | RECTAL | Status: DC | PRN

## 2023-05-16 MED ORDER — 0.9 % SODIUM CHLORIDE (POUR BTL) OPTIME
TOPICAL | Status: DC | PRN
  Administered 2023-05-16: 500 mL

## 2023-05-16 MED ORDER — PROPOFOL 1000 MG/100ML IV EMUL
INTRAVENOUS | Status: AC
  Filled 2023-05-16: qty 100

## 2023-05-16 MED ORDER — VASOPRESSIN 20 UNIT/ML IV SOLN
INTRAVENOUS | Status: DC | PRN
  Administered 2023-05-16 (×3): 2 [IU] via INTRAVENOUS

## 2023-05-16 MED ORDER — ONDANSETRON HCL 4 MG PO TABS: 4.0000 mg | ORAL_TABLET | Freq: Four times a day (QID) | ORAL | Status: DC | PRN

## 2023-05-16 MED ORDER — PHENYLEPHRINE HCL-NACL 20-0.9 MG/250ML-% IV SOLN
INTRAVENOUS | Status: DC | PRN
  Administered 2023-05-16: 25 ug/min via INTRAVENOUS

## 2023-05-16 MED ORDER — BUPIVACAINE HCL (PF) 0.5 % IJ SOLN
INTRAMUSCULAR | Status: AC
  Filled 2023-05-16: qty 30

## 2023-05-16 MED ORDER — DUTASTERIDE 0.5 MG PO CAPS: 0.5000 mg | ORAL_CAPSULE | ORAL | Status: DC

## 2023-05-16 MED ORDER — CHLORHEXIDINE GLUCONATE 0.12 % MT SOLN
OROMUCOSAL | Status: AC
  Filled 2023-05-16: qty 15

## 2023-05-16 MED ORDER — METHOCARBAMOL 1000 MG/10ML IJ SOLN: 500.0000 mg | Freq: Four times a day (QID) | INTRAMUSCULAR | Status: DC | PRN

## 2023-05-16 MED ORDER — SULFAMETHOXAZOLE-TRIMETHOPRIM 400-80 MG PO TABS
1.0000 | ORAL_TABLET | ORAL | Status: DC
  Administered 2023-05-17: 1 via ORAL
  Filled 2023-05-16: qty 1

## 2023-05-16 MED ORDER — METHYLPREDNISOLONE ACETATE 40 MG/ML IJ SUSP
INTRAMUSCULAR | Status: DC | PRN
  Administered 2023-05-16: 40 mg

## 2023-05-16 MED ORDER — SODIUM CHLORIDE (PF) 0.9 % IJ SOLN
INTRAMUSCULAR | Status: DC | PRN
  Administered 2023-05-16: 60 mL via INTRAMUSCULAR

## 2023-05-16 MED ORDER — PROPOFOL 10 MG/ML IV BOLUS
INTRAVENOUS | Status: DC | PRN
  Administered 2023-05-16: 70 mg via INTRAVENOUS
  Administered 2023-05-16: 150 mg via INTRAVENOUS

## 2023-05-16 MED ORDER — PANTOPRAZOLE SODIUM 40 MG PO TBEC
40.0000 mg | DELAYED_RELEASE_TABLET | Freq: Every day | ORAL | Status: DC | PRN
Start: 2023-05-16 — End: 2023-05-17

## 2023-05-16 MED ORDER — BUPIVACAINE-EPINEPHRINE (PF) 0.5% -1:200000 IJ SOLN
INTRAMUSCULAR | Status: DC | PRN
  Administered 2023-05-16: 10 mL

## 2023-05-16 MED ORDER — ALFUZOSIN HCL ER 10 MG PO TB24: 10.0000 mg | ORAL_TABLET | Freq: Every evening | ORAL | Status: DC

## 2023-05-16 MED ORDER — METHYLPREDNISOLONE ACETATE 40 MG/ML IJ SUSP
INTRAMUSCULAR | Status: AC
  Filled 2023-05-16: qty 1

## 2023-05-16 MED ORDER — FENTANYL CITRATE (PF) 100 MCG/2ML IJ SOLN
INTRAMUSCULAR | Status: DC | PRN
  Administered 2023-05-16 (×2): 50 ug via INTRAVENOUS

## 2023-05-16 MED ORDER — FLUTICASONE FUROATE-VILANTEROL 200-25 MCG/ACT IN AEPB
1.0000 | INHALATION_SPRAY | Freq: Every day | RESPIRATORY_TRACT | Status: DC
  Filled 2023-05-16: qty 28

## 2023-05-16 MED ORDER — ACETAMINOPHEN 500 MG PO TABS
1000.0000 mg | ORAL_TABLET | Freq: Four times a day (QID) | ORAL | Status: DC
  Administered 2023-05-16 – 2023-05-17 (×4): 1000 mg via ORAL

## 2023-05-16 MED ORDER — LORATADINE 10 MG PO TABS: 10.0000 mg | ORAL_TABLET | Freq: Every day | ORAL | Status: DC | PRN

## 2023-05-16 MED ORDER — FENTANYL CITRATE (PF) 100 MCG/2ML IJ SOLN: 25.0000 ug | INTRAMUSCULAR | Status: DC | PRN

## 2023-05-16 MED ORDER — SENNA 8.6 MG PO TABS
1.0000 | ORAL_TABLET | Freq: Two times a day (BID) | ORAL | Status: DC
  Administered 2023-05-16 – 2023-05-17 (×3): 8.6 mg via ORAL

## 2023-05-16 MED ORDER — GUAIFENESIN ER 600 MG PO TB12
600.0000 mg | ORAL_TABLET | Freq: Every day | ORAL | Status: DC
  Administered 2023-05-17: 600 mg via ORAL
  Filled 2023-05-16: qty 1

## 2023-05-16 MED ORDER — CEFAZOLIN SODIUM-DEXTROSE 2-4 GM/100ML-% IV SOLN
2.0000 g | INTRAVENOUS | Status: AC
  Administered 2023-05-16 (×2): 2 g via INTRAVENOUS

## 2023-05-16 MED ORDER — MELATONIN 5 MG PO TABS
10.0000 mg | ORAL_TABLET | Freq: Every day | ORAL | Status: DC
Start: 2023-05-16 — End: 2023-05-17
  Administered 2023-05-16: 10 mg via ORAL

## 2023-05-16 MED ORDER — MELATONIN 5 MG PO TABS
ORAL_TABLET | ORAL | Status: AC
  Filled 2023-05-16: qty 1

## 2023-05-16 MED ORDER — DEXAMETHASONE SODIUM PHOSPHATE 10 MG/ML IJ SOLN
INTRAMUSCULAR | Status: DC | PRN
  Administered 2023-05-16: 10 mg via INTRAVENOUS

## 2023-05-16 MED ORDER — ACETAMINOPHEN 10 MG/ML IV SOLN
INTRAVENOUS | Status: AC
  Filled 2023-05-16: qty 100

## 2023-05-16 MED ORDER — PREDNISONE 2.5 MG PO TABS
2.5000 mg | ORAL_TABLET | Freq: Every day | ORAL | Status: DC
  Administered 2023-05-17: 2.5 mg via ORAL
  Filled 2023-05-16: qty 1

## 2023-05-16 MED ORDER — UMECLIDINIUM BROMIDE 62.5 MCG/ACT IN AEPB
1.0000 | INHALATION_SPRAY | Freq: Every day | RESPIRATORY_TRACT | Status: DC
  Filled 2023-05-16: qty 7

## 2023-05-16 MED ORDER — SODIUM CHLORIDE FLUSH 0.9 % IV SOLN
INTRAVENOUS | Status: AC
  Filled 2023-05-16: qty 20

## 2023-05-16 MED ORDER — OXYCODONE HCL 5 MG PO TABS
ORAL_TABLET | ORAL | Status: AC
  Filled 2023-05-16: qty 1

## 2023-05-16 MED ORDER — SORBITOL 70 % SOLN: 30.0000 mL | Freq: Every day | Status: DC | PRN

## 2023-05-16 MED ORDER — DEXMEDETOMIDINE HCL IN NACL 200 MCG/50ML IV SOLN
INTRAVENOUS | Status: DC | PRN
  Administered 2023-05-16: 8 ug via INTRAVENOUS

## 2023-05-16 MED ORDER — LIDOCAINE HCL (CARDIAC) PF 100 MG/5ML IV SOSY
PREFILLED_SYRINGE | INTRAVENOUS | Status: DC | PRN
  Administered 2023-05-16: 100 mg via INTRAVENOUS

## 2023-05-16 MED ORDER — MAGNESIUM CITRATE PO SOLN: 1.0000 | Freq: Once | ORAL | Status: DC | PRN

## 2023-05-16 SURGICAL SUPPLY — 36 items
BASIN KIT SINGLE STR (MISCELLANEOUS) ×1 IMPLANT
BUR NEURO DRILL SOFT 3.0X3.8M (BURR) ×1 IMPLANT
CNTNR URN SCR LID CUP LEK RST (MISCELLANEOUS) IMPLANT
DERMABOND ADVANCED .7 DNX12 (GAUZE/BANDAGES/DRESSINGS) ×1 IMPLANT
DRAPE C ARM PK CFD 31 SPINE (DRAPES) ×1 IMPLANT
DRAPE LAPAROTOMY 100X77 ABD (DRAPES) ×1 IMPLANT
DRAPE MICROSCOPE SPINE 48X150 (DRAPES) IMPLANT
DRSG OPSITE POSTOP 4X6 (GAUZE/BANDAGES/DRESSINGS) IMPLANT
DRSG TEGADERM 4X4.75 (GAUZE/BANDAGES/DRESSINGS) IMPLANT
ELECT EZSTD 165MM 6.5IN (MISCELLANEOUS) ×1
ELECT REM PT RETURN 9FT ADLT (ELECTROSURGICAL) ×1
ELECTRODE EZSTD 165MM 6.5IN (MISCELLANEOUS) ×1 IMPLANT
ELECTRODE REM PT RTRN 9FT ADLT (ELECTROSURGICAL) ×1 IMPLANT
EVACUATOR 1/8 PVC DRAIN (DRAIN) IMPLANT
GLOVE BIOGEL PI IND STRL 6.5 (GLOVE) ×1 IMPLANT
GLOVE SURG SYN 6.5 ES PF (GLOVE) ×1 IMPLANT
GLOVE SURG SYN 6.5 PF PI (GLOVE) ×1 IMPLANT
GLOVE SURG SYN 8.5 E (GLOVE) ×3 IMPLANT
GLOVE SURG SYN 8.5 PF PI (GLOVE) ×3 IMPLANT
GOWN SRG LRG LVL 4 IMPRV REINF (GOWNS) ×1 IMPLANT
GOWN SRG XL LVL 3 NONREINFORCE (GOWNS) ×1 IMPLANT
GRAFT DURAGEN MATRIX 1WX1L (Tissue) IMPLANT
KIT SPINAL PRONEVIEW (KITS) ×1 IMPLANT
MANIFOLD NEPTUNE II (INSTRUMENTS) ×1 IMPLANT
MARKER SKIN DUAL TIP RULER LAB (MISCELLANEOUS) ×1 IMPLANT
NDL SAFETY ECLIPSE 18X1.5 (NEEDLE) ×1 IMPLANT
NS IRRIG 500ML POUR BTL (IV SOLUTION) ×1 IMPLANT
PACK LAMINECTOMY ARMC (PACKS) ×1 IMPLANT
SURGIFLO W/THROMBIN 8M KIT (HEMOSTASIS) ×1 IMPLANT
SUT ETHILON 3-0 FS-10 30 BLK (SUTURE) ×1
SUT STRATA 3-0 15 PS-2 (SUTURE) ×1 IMPLANT
SUT VIC AB 0 CT1 27XCR 8 STRN (SUTURE) ×1 IMPLANT
SUT VIC AB 2-0 CT1 18 (SUTURE) ×1 IMPLANT
SUTURE EHLN 3-0 FS-10 30 BLK (SUTURE) IMPLANT
SYR 3ML LL SCALE MARK (SYRINGE) ×2 IMPLANT
TRAP FLUID SMOKE EVACUATOR (MISCELLANEOUS) ×1 IMPLANT

## 2023-05-16 NOTE — Op Note (Signed)
Indications: Bryan Mcclain is suffering from lumbar radiculopathy,left leg weakness. The patient tried and failed conservative management, prompting surgical intervention.  Findings: large disc herniation at L2/3  Preoperative Diagnosis: Lumbar radiculopathy (ICD-10 M54.16), Left Leg Weakness (R29.898) Postoperative Diagnosis: same   EBL: 50 ml IVF: see anesthesia record Drains: one Disposition: Extubated and Stable to PACU Complications: none  No foley catheter was placed.   Preoperative Note:   Risks of surgery discussed include: infection, bleeding, stroke, coma, death, paralysis, CSF leak, nerve/spinal cord injury, numbness, tingling, weakness, complex regional pain syndrome, recurrent stenosis and/or disc herniation, vascular injury, development of instability, neck/back pain, need for further surgery, persistent symptoms, development of deformity, and the risks of anesthesia. The patient understood these risks and agreed to proceed.  Operative Note:   1) Left L2/3 microdiscectomy 2) L3-5 lumbar decompression including central laminectomy and left medial facetectomies including foraminotomies  The patient was then brought from the preoperative center with intravenous access established.  The patient underwent general anesthesia and endotracheal tube intubation, and was then rotated on the Olar rail top where all pressure points were appropriately padded.  The skin was then thoroughly cleansed.  Perioperative antibiotic prophylaxis was administered.  Sterile prep and drapes were then applied and a timeout was then observed.  C-arm was brought into the field under sterile conditions, and the L2-5 levels were identified and marked.  Once this was complete, the prior incision was opened with the use of a #10 blade knife.  The Metrx tubes were sequentially advanced under lateral fluoroscopy until a 18 x 50 mm Metrx tube was placed over the left L4/5 facet and lamina and secured to  the bed.    The microscope was then sterilely brought into the field. The muscle creep was hemostased with a bipolar and resected with a pituitary rongeur.  A Bovie extender was then used to expose the spinous process and lamina.  Careful attention was placed to not violate the facet capsule. A 3 mm matchstick drill bit was then used to make a hemi-laminotomy trough until the ligamentum flavum was exposed.  This was extended to the base of the spinous process.  Once this was complete and the underlying ligamentum flavum was visualized, it was dissected with a curette and resected with Kerrison rongeurs.  Extensive ligamentum hypertrophy was noted, requiring a substantial amount of time and care for removal.  The dura was identified and palpated. The kerrison rongeur was then used to remove the medial facet bilaterally until no compression was noted.  A balltip probe was used to confirm decompression of the ipsilateral L5 nerve root.  No CSF leak was noted.  Depo-Medrol was placed on the nerve root.  The wound was copiously irrigated. The tube system was then removed under microscopic visualization and hemostasis was obtained with a bipolar.    After performing the decompression at L4-5, the metrx tubes were sequentially advanced and confirmed in position at L3-4. An 18mm by 50mm tube was locked in place to the bed side attachment.  Fluoroscopy was then removed from the field.  The microscope was then sterilely brought into the field and muscle creep was hemostased with a bipolar and resected with a pituitary rongeur.  A Bovie extender was then used to expose the spinous process and lamina.  Careful attention was placed to not violate the facet capsule. A 3 mm matchstick drill bit was then used to make a hemi-laminotomy trough until the ligamentum flavum was exposed.  This was  extended to the base of the spinous process and to the contralateral side to remove all the central bone from each side.  Once this  was complete and the underlying ligamentum flavum was visualized, it was dissected with a curette and resected with Kerrison rongeurs.  Extensive ligamentum hypertrophy was noted, requiring a substantial amount of time and care for removal.  The dura was identified and palpated. The kerrison rongeur was then used to remove the medial facet bilaterally until no compression was noted.  A balltip probe was used to confirm decompression of the ipsilateral L4 nerve root.  No CSF leak was noted.  Depo-Medrol was placed on the nerve root.   The wound was copiously irrigated. The tube system was then removed under microscopic visualization and hemostasis was obtained with a bipolar.    At this point, the laminotomy was extended superiorly to the L3 pedicle. We then repositioned the tube to the L2/3 disc space and secured it to the bedframe.  Then, the muscle creep was hemostased with a bipolar and resected with a pituitary rongeur.  A Bovie extender was then used to expose the spinous process and lamina.  Careful attention was placed to not violate the facet capsule. A 3 mm matchstick drill bit was then used to make a hemi-laminotomy trough until the ligamentum flavum was exposed.  This was extended to the base of the spinous process.  Once this was complete and the underlying ligamentum flavum was visualized, the ligamentum was dissected with an up angle curette and resected with a #2 and #3 mm biting Kerrison.  The laminotomy opening was also expanded in similar fashion and hemostasis was obtained with Surgifoam and a patty as well as bone wax.  The rostral aspect of the caudal level of the lamina was also resected with a #2 biting Kerrison effort to further enhance exposure and connect with the extended approach from L3/4.  Once the underlying dura was visualized a Penfield 4 was then used to dissect and expose the traversing nerve root.  Once this was identified a nerve root retractor suction was used to mobilize  this medially.  The venous plexus was hemostased with Surgifoam and light bipolar use.  A small penfied was then used to make a small annulotomy within the disc space and disc space contents were noted to come through the annulus.    The disc herniation was identified and dissected free using a balltip probe. The pituitary rongeur was used to remove the extruded disc fragments. Once the thecal sac and nerve root were noted to be relaxed and under less tension the ball-tipped feeler was passed along the foramen distally to ensure no residual compression was noted.    Depo-Medrol was placed along the nerve root.  The area was irrigated. The tube system was then removed under microscopic visualization and hemostasis was obtained with a bipolar.    A drain was placed.  The fascial layer was reapproximated with the use of a 0- Vicryl suture.  Subcutaneous tissue layer was reapproximated using 2-0 Vicryl suture.  3-0 monocryl was used on the skin. The skin was then cleansed and Dermabond was used to close the skin opening.  Patient was then rotated back to the preoperative bed awakened from anesthesia and taken to recovery all counts are correct in this case.   I performed the entire procedure with the assistance of Manning Charity PA as an Designer, television/film set. An assistant was required for this procedure due to the complexity.  The assistant provided assistance in tissue manipulation and suction, and was required for the successful and safe performance of the procedure. I performed the critical portions of the procedure.   Venetia Night MD

## 2023-05-16 NOTE — Plan of Care (Signed)
  Problem: Pain Management: Goal: General experience of comfort will improve Outcome: Progressing   Problem: Safety: Goal: Ability to remain free from injury will improve Outcome: Progressing

## 2023-05-16 NOTE — Discharge Instructions (Signed)
Your surgeon has performed an operation on your lumbar spine (low back) to relieve pressure on one or more nerves. Many times, patients feel better immediately after surgery and can "overdo it." Even if you feel well, it is important that you follow these activity guidelines. If you do not let your back heal properly from the surgery, you can increase the chance of a disc herniation and/or return of your symptoms. The following are instructions to help in your recovery once you have been discharged from the hospital.  * It is ok to take NSAIDs after surgery.  Activity    No bending, lifting, or twisting ("BLT"). Avoid lifting objects heavier than 10 pounds (gallon milk jug).  Where possible, avoid household activities that involve lifting, bending, pushing, or pulling such as laundry, vacuuming, grocery shopping, and childcare. Try to arrange for help from friends and family for these activities while your back heals.  Increase physical activity slowly as tolerated.  Taking short walks is encouraged, but avoid strenuous exercise. Do not jog, run, bicycle, lift weights, or participate in any other exercises unless specifically allowed by your doctor. Avoid prolonged sitting, including car rides.  Talk to your doctor before resuming sexual activity.  You should not drive until cleared by your doctor.  Until released by your doctor, you should not return to work or school.  You should rest at home and let your body heal.   You may shower three days after your surgery.  After showering, lightly dab your incision dry. Do not take a tub bath or go swimming for 3 weeks, or until approved by your doctor at your follow-up appointment.  If you smoke, we strongly recommend that you quit.  Smoking has been proven to interfere with normal healing in your back and will dramatically reduce the success rate of your surgery. Please contact [facility] (800-QUIT-NOW) and use the resources at www.[facility].com for  assistance in stopping smoking.  Surgical Incision   If you have a dressing on your incision, you may remove it three days after your surgery. Keep your incision area clean and dry.  If you have staples or stitches on your incision, you should have a follow up scheduled for removal. If you do not have staples or stitches, you will have steri-strips (small pieces of surgical tape) or Dermabond glue. The steri-strips/glue should begin to peel away within about a week (it is fine if the steri-strips fall off before then). If the strips are still in place one week after your surgery, you may gently remove them.  Diet            You may return to your usual diet. Be sure to stay hydrated.  When to Contact us  Although your surgery and recovery will likely be uneventful, you may have some residual numbness, aches, and pains in your back and/or legs. This is normal and should improve in the next few weeks.  However, should you experience any of the following, contact us immediately: New numbness or weakness Pain that is progressively getting worse, and is not relieved by your pain medications or rest Bleeding, redness, swelling, pain, or drainage from surgical incision Chills or flu-like symptoms Fever greater than 101.0 F (38.3 C) Problems with bowel or bladder functions Difficulty breathing or shortness of breath Warmth, tenderness, or swelling in your calf  Contact Information How to contact us:  If you have any questions/concerns before or after surgery, you can reach [place] at [phone], or you can  send a [employer]. We can be reached by phone or mychart 8am-4pm, Monday-Friday.  *Please note: Calls after 4pm are forwarded to a third party answering service. Mychart messages are not routinely monitored during evenings, weekends, and holidays. Please call our office to contact the answering service for urgent concerns during non-business hours.

## 2023-05-16 NOTE — Anesthesia Preprocedure Evaluation (Signed)
Anesthesia Evaluation  Patient identified by MRN, date of birth, ID band Patient awake    Reviewed: Allergy & Precautions, H&P , NPO status , Patient's Chart, lab work & pertinent test results, reviewed documented beta blocker date and time   History of Anesthesia Complications (+) PONV and history of anesthetic complications  Airway Mallampati: II  TM Distance: >3 FB Neck ROM: full    Dental  (+) Dental Advidsory Given, Poor Dentition, Caps, Teeth Intact   Pulmonary shortness of breath and with exertion, sleep apnea , COPD,  COPD inhaler and oxygen dependent, neg recent URI, former smoker   Pulmonary exam normal breath sounds clear to auscultation       Cardiovascular Exercise Tolerance: Good hypertension, (-) angina (-) CAD, (-) Past MI and (-) Cardiac Stents Normal cardiovascular exam(-) dysrhythmias (-) Valvular Problems/Murmurs Rhythm:regular Rate:Normal     Neuro/Psych neg Seizures  Neuromuscular disease  negative psych ROS   GI/Hepatic Neg liver ROS,GERD  ,,  Endo/Other  diabetes (borderline)    Renal/GU CRFRenal disease  negative genitourinary   Musculoskeletal   Abdominal   Peds  Hematology negative hematology ROS (+)   Anesthesia Other Findings Past Medical History: No date: CAP (community acquired pneumonia) No date: CKD (chronic kidney disease), stage III (HCC) No date: Cluster headache No date: Complication of anesthesia No date: COPD (chronic obstructive pulmonary disease) (HCC) No date: DM (diabetes mellitus), type 2 (HCC) No date: Dyspnea No date: GERD (gastroesophageal reflux disease) No date: Hyperlipidemia No date: Hypertension No date: Lumbar radiculopathy No date: Lumbar stenosis No date: PONV (postoperative nausea and vomiting) No date: Sleep apnea     Comment:  cannot tolerate cpap No date: Vitamin B 12 deficiency   Reproductive/Obstetrics negative OB ROS                              Anesthesia Physical Anesthesia Plan  ASA: 3  Anesthesia Plan: General   Post-op Pain Management:    Induction: Intravenous  PONV Risk Score and Plan: 3 and Ondansetron, Dexamethasone and Treatment may vary due to age or medical condition  Airway Management Planned: Oral ETT  Additional Equipment:   Intra-op Plan:   Post-operative Plan: Extubation in OR  Informed Consent: I have reviewed the patients History and Physical, chart, labs and discussed the procedure including the risks, benefits and alternatives for the proposed anesthesia with the patient or authorized representative who has indicated his/her understanding and acceptance.     Dental Advisory Given  Plan Discussed with: Anesthesiologist, CRNA and Surgeon  Anesthesia Plan Comments:         Anesthesia Quick Evaluation

## 2023-05-16 NOTE — Transfer of Care (Signed)
Immediate Anesthesia Transfer of Care Note  Patient: Bryan Mcclain  Procedure(s) Performed: LEFT L2-3 MICRODISCECTOMY (Left: Spine Lumbar) LEFT L3-4 & L4-5 LAMINOFORAMINOTOMY (Left: Spine Lumbar)  Patient Location: PACU  Anesthesia Type:General  Level of Consciousness: awake, drowsy, and patient cooperative  Airway & Oxygen Therapy: Patient Spontanous Breathing and Patient connected to face mask oxygen  Post-op Assessment: Report given to RN and Post -op Vital signs reviewed and stable  Post vital signs: Reviewed and stable  Last Vitals:  Vitals Value Taken Time  BP    Temp    Pulse 91 05/16/23 1028  Resp 14 05/16/23 1028  SpO2 100 % 05/16/23 1028  Vitals shown include unfiled device data.  Last Pain:  Vitals:   05/16/23 0640  TempSrc: Oral  PainSc: 5          Complications: No notable events documented.

## 2023-05-16 NOTE — Anesthesia Procedure Notes (Signed)
Procedure Name: Intubation Date/Time: 05/16/2023 7:23 AM  Performed by: Mohammed Kindle, CRNAPre-anesthesia Checklist: Patient identified Patient Re-evaluated:Patient Re-evaluated prior to induction Oxygen Delivery Method: Circle system utilized Preoxygenation: Pre-oxygenation with 100% oxygen Induction Type: IV induction Ventilation: Mask ventilation without difficulty Laryngoscope Size: McGrath and 3 Grade View: Grade I Tube type: Oral Tube size: 6.5 mm Number of attempts: 1 Airway Equipment and Method: Stylet Placement Confirmation: ETT inserted through vocal cords under direct vision, positive ETCO2, breath sounds checked- equal and bilateral and CO2 detector Secured at: 21 cm Tube secured with: Tape Dental Injury: Teeth and Oropharynx as per pre-operative assessment

## 2023-05-16 NOTE — Interval H&P Note (Signed)
History and Physical Interval Note:  05/16/2023 6:56 AM  Donna Bernard  has presented today for surgery, with the diagnosis of lumbar radiculopathy,left leg weakness.  The various methods of treatment have been discussed with the patient and family. After consideration of risks, benefits and other options for treatment, the patient has consented to  Procedure(s): LEFT L2-3 MICRODISCECTOMY (Left) LEFT L3-4 & L4-5 LAMINOFORAMINOTOMY (Left) as a surgical intervention.  The patient's history has been reviewed, patient examined, no change in status, stable for surgery.  I have reviewed the patient's chart and labs.  Questions were answered to the patient's satisfaction.    Heart sounds normal no MRG. Chest Clear to Auscultation Bilaterally.   Kendal Raffo

## 2023-05-17 DIAGNOSIS — M48061 Spinal stenosis, lumbar region without neurogenic claudication: Secondary | ICD-10-CM | POA: Diagnosis not present

## 2023-05-17 MED ORDER — ACETAMINOPHEN 500 MG PO TABS
ORAL_TABLET | ORAL | Status: AC
  Filled 2023-05-17: qty 2

## 2023-05-17 MED ORDER — ALBUTEROL SULFATE (2.5 MG/3ML) 0.083% IN NEBU
INHALATION_SOLUTION | RESPIRATORY_TRACT | Status: AC
  Filled 2023-05-17: qty 3

## 2023-05-17 MED ORDER — GABAPENTIN 300 MG PO CAPS
ORAL_CAPSULE | ORAL | Status: AC
  Filled 2023-05-17: qty 1

## 2023-05-17 MED ORDER — ENOXAPARIN SODIUM 40 MG/0.4ML IJ SOSY
PREFILLED_SYRINGE | INTRAMUSCULAR | Status: AC
  Filled 2023-05-17: qty 0.4

## 2023-05-17 MED ORDER — SENNA 8.6 MG PO TABS
ORAL_TABLET | ORAL | Status: AC
  Filled 2023-05-17: qty 1

## 2023-05-17 MED ORDER — IPRATROPIUM-ALBUTEROL 0.5-2.5 (3) MG/3ML IN SOLN
RESPIRATORY_TRACT | Status: AC
  Filled 2023-05-17: qty 3

## 2023-05-17 MED ORDER — DOCUSATE SODIUM 100 MG PO CAPS
ORAL_CAPSULE | ORAL | Status: AC
  Filled 2023-05-17: qty 1

## 2023-05-17 MED ORDER — DILTIAZEM HCL ER COATED BEADS 180 MG PO CP24
ORAL_CAPSULE | ORAL | Status: AC
  Filled 2023-05-17: qty 1

## 2023-05-17 NOTE — Anesthesia Postprocedure Evaluation (Signed)
Anesthesia Post Note  Patient: Jamaar Bottorff  Procedure(s) Performed: LEFT L2-3 MICRODISCECTOMY (Left: Spine Lumbar) LEFT L3-4 & L4-5 LAMINOFORAMINOTOMY (Left: Spine Lumbar)  Patient location during evaluation: PACU Anesthesia Type: General Level of consciousness: awake and alert Pain management: pain level controlled Vital Signs Assessment: post-procedure vital signs reviewed and stable Respiratory status: spontaneous breathing, nonlabored ventilation, respiratory function stable and patient connected to nasal cannula oxygen Cardiovascular status: blood pressure returned to baseline and stable Postop Assessment: no apparent nausea or vomiting Anesthetic complications: no   No notable events documented.   Last Vitals:  Vitals:   05/17/23 1200 05/17/23 1340  BP:  129/64  Pulse:  80  Resp:  16  Temp:  36.8 C  SpO2: 93% 94%    Last Pain:  Vitals:   05/17/23 1340  TempSrc: Oral  PainSc:                  Lenard Simmer

## 2023-05-17 NOTE — Evaluation (Signed)
Occupational Therapy Evaluation Patient Details Name: Bryan Mcclain MRN: [id number] DOB: [birth year] Today's Date: 05/17/2023   History of Present Illness Pt is a 77 year old male s/p eft L2/3 microdiscectomy, L3-5 lumbar decompression including central laminectomy and left medial facetectomies including foraminotomies 05/16/23   Clinical Impression   Pt seen for OT evaluation this date, POD#1 from above procedure. PTA pt is MOD I-I in ADL/IADL, amb with no AD, recent decrease in tolerance due to LEL weakenss. Pt provided education WU:[id number], self care skills, AE, and home/routines modifications to maximize safety and functional independence while minimizing falls risk and maintaining precautions. Pt verbalized understanding of all education/training provided. Able to return demonstration safe techniques while maintaining cervical precautions throughout. Handout provided to support recall and carry over of learned precautions/techniques for bed mobility, functional transfers, and self care skills. Pt will benefit from acute OT to address functional deficits and to facilitate optimal ADL performance.       If plan is discharge home, recommend the following: A little help with bathing/dressing/bathroom;Assist for transportation;Help with stairs or ramp for entrance;Assistance with cooking/housework    Functional Status Assessment  Patient has had a recent decline in their functional status and demonstrates the ability to make significant improvements in function in a reasonable and predictable amount of time.  Equipment Recommendations  BSC/3in1    Recommendations for Other Services       Precautions / Restrictions Precautions Precautions: Back Restrictions Weight Bearing Restrictions Per Provider Order: No      Mobility Bed Mobility Overal bed mobility: Needs Assistance Bed Mobility: Rolling, Sidelying to Sit Rolling: Supervision, Used rails Sidelying to sit: Supervision, HOB  elevated       General bed mobility comments: frequent vcs for technique    Transfers Overall transfer level: Needs assistance Equipment used: Rolling walker (2 wheels) Transfers: Sit to/from Stand Sit to Stand: Supervision                  Balance Overall balance assessment: Needs assistance Sitting-balance support: Feet supported Sitting balance-Leahy Scale: Good     Standing balance support: Bilateral upper extremity supported Standing balance-Leahy Scale: Good                             ADL either performed or assessed with clinical judgement   ADL Overall ADL's : Needs assistance/impaired Eating/Feeding: Set up   Grooming: Wash/dry hands;Standing;Supervision/safety Grooming Details (indicate cue type and reason): sink level with RW         Upper Body Dressing : Set up Upper Body Dressing Details (indicate cue type and reason): anticipate Lower Body Dressing: Supervision/safety Lower Body Dressing Details (indicate cue type and reason): figure 4 technique, discussed use of AE, pt reports he does not wear socks at home Toilet Transfer: Supervision/safety;Contact guard assist;Rolling walker (2 wheels);BSC/3in1;Cueing for sequencing   Toileting- Clothing Manipulation and Hygiene: Supervision/safety;Sitting/lateral lean Toileting - Clothing Manipulation Details (indicate cue type and reason): anticipate     Functional mobility during ADLs: Supervision/safety;Contact guard assist;Rolling walker (2 wheels) (approx 10' in room, hand off to PT for further fxl mobility)       Vision Patient Visual Report: No change from baseline       Perception         Praxis         Pertinent Vitals/Pain Pain Assessment Pain Assessment: No/denies pain     Extremity/Trunk Assessment Upper Extremity Assessment Upper Extremity Assessment: Overall [facility]  for tasks assessed   Lower Extremity Assessment Lower Extremity Assessment: Overall WFL for tasks  assessed       Communication Communication Communication: No apparent difficulties Cueing Techniques: Verbal cues   Cognition Arousal: Alert Behavior During Therapy: WFL for tasks assessed/performed Overall Cognitive Status: Within Functional Limits for tasks assessed                                       General Comments  vss throughout    Exercises     Shoulder Instructions      Home Living Family/patient expects to be discharged to:: Private residence Living Arrangements: Spouse/significant other Available Help at Discharge: Family;Available 24 hours/day Type of Home: House Home Access: Stairs to enter;Ramped entrance (2 steps at main enterance) Entrance Stairs-Number of Steps: 5 Entrance Stairs-Rails: Right;Left Home Layout: One level     Bathroom Shower/Tub: Producer, television/film/video: Standard     Home Equipment: Cane - quad;Grab bars - tub/shower;Shower seat - built in;Hand held shower head          Prior Functioning/Environment Prior Level of Function : Independent/Modified Independent             Mobility Comments: up until about 6 weeks ago going to gym daily walking on treadmill, bike, weights; then limited community distances due to increased pain/weakness LLE ADLs Comments: MOD I-I in ADL/IADL        OT Problem List: Decreased activity tolerance;Decreased knowledge of use of DME or AE      OT Treatment/Interventions: Self-care/ADL training;DME and/or AE instruction;Therapeutic activities;Balance training;Therapeutic exercise;Patient/family education    OT Goals(Current goals can be found in the care plan section) Acute Rehab OT Goals Patient Stated Goal: go home OT Goal Formulation: With patient/family Time For Goal Achievement: 05/31/23 Potential to Achieve Goals: Good ADL Goals Pt Will Perform Grooming: with modified independence;sitting Pt Will Perform Lower Body Dressing: with modified independence;sit to/from  stand Pt Will Transfer to Toilet: with modified independence Pt Will Perform Toileting - Clothing Manipulation and hygiene: with modified independence;sit to/from stand  OT Frequency: Min 1X/week    Co-evaluation              AM-PAC OT "6 Clicks" Daily Activity     Outcome Measure Help from another person eating meals?: None Help from another person taking care of personal grooming?: None Help from another person toileting, which includes using toliet, bedpan, or urinal?: None Help from another person bathing (including washing, rinsing, drying)?: A Little Help from another person to put on and taking off regular upper body clothing?: None Help from another person to put on and taking off regular lower body clothing?: None 6 Click Score: 23   End of Session Equipment Utilized During Treatment: Rolling walker (2 wheels) Nurse Communication: Other (comment)  Activity Tolerance: Patient tolerated treatment well Patient left:  (on edge of bed with family present, hand off to PT)  OT Visit Diagnosis: Other abnormalities of gait and mobility (R26.89)                Time: 7829-5621 OT Time Calculation (min): 23 min Charges:  OT General Charges $OT Visit: 1 Visit OT Evaluation $OT Eval Low Complexity: 1 Low Oleta Mouse, OTD OTR/L  05/17/23, 1:05 PM

## 2023-05-17 NOTE — Evaluation (Signed)
Physical Therapy Evaluation Patient Details Name: Bryan Mcclain MRN: [id number] DOB: October 27, 1945 Today's Date: 05/17/2023  History of Present Illness  77 y/o male s/p L2-3 discectomy/laminectomy 12/18  Clinical Impression  Pt finishing with OT on arrival, moving well and minimal c/o pain, etc.  He displays good effort with all tasks; was able to ambulate >200 ft with walker and another 100 ft w/o AD - did fatigue with these efforts with SpO2 dropping into the 80s (room air).  He does endorse some fatigue and would benefit from consistent use of walker initially.  Pt, however, is moving well and showed safety with stair negotiation and mobility in general.  Continue with PT per neuro recs and protocols.      If plan is discharge home, recommend the following: Assist for transportation;Assistance with cooking/housework;A little help with bathing/dressing/bathroom   Can travel by private vehicle        Equipment Recommendations Rolling walker (2 wheels) (possibly a 3-in-1)  Recommendations for Other Services       Functional Status Assessment Patient has had a recent decline in their functional status and demonstrates the ability to make significant improvements in function in a reasonable and predictable amount of time.     Precautions / Restrictions Precautions Precautions: None Restrictions Weight Bearing Restrictions Per Provider Order: No      Mobility  Bed Mobility                    Transfers Overall transfer level: Independent Equipment used: Rolling walker (2 wheels)               General transfer comment: easily able to rise from sitting with light UE use from various surfaces    Ambulation/Gait Ambulation/Gait assistance: Modified independent (Device/Increase time) Gait Distance (Feet): 300 Feet Assistive device: Rolling walker (2 wheels), None         General Gait Details: Pt showed good confidence and ability to manage safely with and w/o AD.   Community appropriate speed, actually had better posture w/o AD but fatigue necessitates walker for more prolonged efforts  Stairs Stairs: Yes Stairs assistance: Modified independent (Device/Increase time) Stair Management: No rails, Two rails, Step to pattern Number of Stairs: 8 General stair comments: 4 steps up/down with b/l rails and no issues, educated on and performed retro negotiation with FWW - able to perform, son present and reports good understanding  Wheelchair Mobility     Tilt Bed    Modified Rankin (Stroke Patients Only)       Balance Overall balance assessment: Independent                                           Pertinent Vitals/Pain Pain Assessment Pain Assessment: No/denies pain    Home Living Family/patient expects to be discharged to:: Private residence Living Arrangements: Spouse/significant other Available Help at Discharge: Family;Available 24 hours/day Type of Home: House Home Access: Stairs to enter;Ramped entrance (2 steps at main enterance) Entrance Stairs-Rails: Right;Left Entrance Stairs-Number of Steps: 5   Home Layout: One level Home Equipment: Cane - quad;Grab bars - tub/shower;Shower seat - built in;Hand held shower head      Prior Function Prior Level of Function : Independent/Modified Independent             Mobility Comments: up until about 6 weeks ago going to gym daily walking on treadmill,  bike, weights; then limited community distances due to increased pain/weakness LLE ADLs Comments: MOD I-I in ADL/IADL     Extremity/Trunk Assessment   Upper Extremity Assessment Upper Extremity Assessment: Overall WFL for tasks assessed    Lower Extremity Assessment Lower Extremity Assessment: Overall WFL for tasks assessed (R vs L strength functional and nearly equal)       Communication   Communication Communication: No apparent difficulties  Cognition Arousal: Alert Behavior During Therapy: WFL for tasks  assessed/performed Overall Cognitive Status: Within Functional Limits for tasks assessed                                          General Comments General comments (skin integrity, edema, etc.): Pt showed good confidence with mobility ambulation, functional strength in b/l LEs, no safety or balance issues and generally moved very well    Exercises     Assessment/Plan    PT Assessment Patient needs continued PT services  PT Problem List Decreased activity tolerance;Decreased balance;Decreased mobility;Decreased knowledge of use of DME;Decreased safety awareness;Pain;Decreased strength       PT Treatment Interventions DME instruction;Gait training;Stair training;Functional mobility training;Therapeutic activities;Therapeutic exercise;Balance training;Patient/family education    PT Goals (Current goals can be found in the Care Plan section)  Acute Rehab PT Goals Patient Stated Goal: go home PT Goal Formulation: With patient Time For Goal Achievement: 05/30/23 Potential to Achieve Goals: Good    Frequency 7X/week     Co-evaluation               AM-PAC PT "6 Clicks" Mobility  Outcome Measure Help needed turning from your back to your side while in a flat bed without using bedrails?: None Help needed moving from lying on your back to sitting on the side of a flat bed without using bedrails?: None Help needed moving to and from a bed to a chair (including a wheelchair)?: None Help needed standing up from a chair using your arms (e.g., wheelchair or bedside chair)?: None Help needed to walk in hospital room?: None Help needed climbing 3-5 steps with a railing? : A Little 6 Click Score: 23    End of Session Equipment Utilized During Treatment: Gait belt Activity Tolerance: Patient tolerated treatment well Patient left: in chair;with call bell/phone within reach;with family/visitor present Nurse Communication: Mobility status PT Visit Diagnosis: Difficulty  in walking, not elsewhere classified (R26.2);Muscle weakness (generalized) (M62.81)    Time: 4098-1191 PT Time Calculation (min) (ACUTE ONLY): 17 min   Charges:   PT Evaluation $PT Eval Low Complexity: 1 Low PT Treatments $Gait Training: 8-22 mins PT General Charges $$ ACUTE PT VISIT: 1 Visit         Malachi Pro, DPT 05/17/2023, 12:30 PM

## 2023-05-17 NOTE — Discharge Summary (Signed)
Physician Discharge Summary  Patient ID: Bryan Mcclain MRN: [id number] DOB/AGE: 78/30/1947 78 y.o.  Admit date: 05/16/2023 Discharge date: 05/17/2023  Admission Diagnoses:  Discharge Diagnoses:  Principal Problem:   S/P lumbar discectomy Active Problems:   Lumbar radiculopathy   Left leg weakness   Discharged Condition: good  Hospital Course:  is POD 1 s/p left L2-3 microdiscectomy and left L3-4 and L4-5 decompression. Pain is well controlled with tylenol and gabapentin.   Drain removed without issue. Consults: None  Treatments: IV hydration, antibiotics: Ancef, analgesia: acetaminophen, and therapies: PT and OT  Discharge Exam: Blood pressure (!) 121/57, pulse 77, temperature 98.1 F (36.7 C), temperature source Oral, resp. rate 16, height 6' (1.829 m), weight 78.9 kg, SpO2 92%.  Incision is well appearing. Dressing is clean and dry and strength exam is to baseline.  Disposition:   Discharge Instructions     Incentive spirometry RT   Complete by: As directed         Follow-up Information     Joan Flores, PA-C Follow up on 05/28/2023.   Specialty: Physician Assistant Contact information: [address] [place], [address] [place] [address] [phone]                 Signed: Joan Flores 05/17/2023, 11:04 AM

## 2023-05-17 NOTE — Progress Notes (Addendum)
   Neurosurgery Progress Note  History: Bryan Mcclain is POD 1 s/p left L2-3 microdiscectomy and left L3-4 and L4-5 decompression.   Overall is doing well with tylenol and gabapentin.   Physical Exam: Vitals:   05/17/23 0739 05/17/23 1003  BP: (!) 121/57 (!) 121/57  Pulse: 77   Resp: 16   Temp: 98.1 F (36.7 C)   SpO2: 92%     AA Ox3 CNI  Incision and bandage is clean, dry, and intact  Strength:5/5 throughout bilateral lower extremities with the exception of 4/5 in left quad  Hemovac drain with 35 ccs since last night.    Assessment/Plan:  Bryan Mcclain is POD 1 s/p left L2-3 microdiscectomy and left L3-4 and L4-5 decompression.   - mobilize - pain control - DVT prophylaxis - PTOT -Plan to pull hemovac drain at lunch today as long as output is low. -Patient indicates he does not need to be sent home with additional pain medication  Joan Flores PA-C Department of Neurosurgery

## 2023-05-17 NOTE — Plan of Care (Signed)
?  Problem: Clinical Measurements: ?Goal: Respiratory complications will improve ?Outcome: Progressing ?  ?Problem: Activity: ?Goal: Risk for activity intolerance will decrease ?Outcome: Progressing ?  ?Problem: Coping: ?Goal: Level of anxiety will decrease ?Outcome: Progressing ?  ?Problem: Elimination: ?Goal: Will not experience complications related to urinary retention ?Outcome: Progressing ?  ?

## 2023-05-17 NOTE — TOC Progression Note (Signed)
Transition of Care [facility]) - Progression Note    Patient Details  Name: Bryan Mcclain MRN: [id number] Date of Birth: February 14, 1946  [facility] CM/SW Contact  Hetty Ely, RN Phone Number: 05/17/2023, 1:19 PM  Clinical Narrative:  CM ordered RW/BSC 3in 1 for dicharge home. Patient is not able to walk the distance required to go the bathroom, or he/she is unable to safely negotiate stairs required to access the bathroom. BSC will alleviate this problem          Expected Discharge Plan and Services                                               Social Determinants of Health (SDOH) Interventions SDOH Screenings   Food Insecurity: No Food Insecurity (05/16/2023)  Housing: Low Risk  (05/16/2023)  Transportation Needs: No Transportation Needs (05/16/2023)  Utilities: Not At Risk (05/16/2023)  Depression (PHQ2-9): Medium Risk (07/28/2022)  Financial Resource Strain: Low Risk  (05/14/2023)   Received from [facility]  Tobacco Use: Medium Risk (05/16/2023)    Readmission Risk Interventions     No data to display

## 2023-05-17 NOTE — Progress Notes (Signed)
DISCHARGE NOTE:   Pt discharged with IV removed and both TED hose on and in place. Pt received education and voiced no further questions or concerns. Pt has walker, 3 in 1, and personal belongings bag. Pt wheeled down to medial mall entrance by staff and pt's wife and son provided transportation.

## 2023-05-17 NOTE — Progress Notes (Signed)
  Transition of Care [facility]) Screening Note   Patient Details  Name: Shravan Gautreau Date of Birth: 12-14-1945   Transition of Care [facility]) CM/SW Contact:    Hetty Ely, RN Phone Number: 05/17/2023, 1:19 PM    [facility]) has reviewed patient and no TOC needs have been identified at this time. We will continue to monitor patient advancement through interdisciplinary progression rounds. If new patient transition needs arise, please place a TOC consult.

## 2023-05-17 NOTE — Plan of Care (Signed)
  Problem: Activity: Goal: Risk for activity intolerance will decrease Outcome: Progressing   Problem: Elimination: Goal: Will not experience complications related to urinary retention Outcome: Progressing   Problem: Pain Management: Goal: General experience of comfort will improve Outcome: Progressing   Problem: Safety: Goal: Ability to remain free from injury will improve Outcome: Progressing

## 2023-05-28 ENCOUNTER — Ambulatory Visit (INDEPENDENT_AMBULATORY_CARE_PROVIDER_SITE_OTHER): Payer: Medicare PPO | Admitting: Physician Assistant

## 2023-05-28 VITALS — BP 130/68 | Ht 72.0 in | Wt 174.0 lb

## 2023-05-28 DIAGNOSIS — R29898 Other symptoms and signs involving the musculoskeletal system: Secondary | ICD-10-CM

## 2023-05-28 DIAGNOSIS — M5416 Radiculopathy, lumbar region: Secondary | ICD-10-CM

## 2023-05-28 DIAGNOSIS — Z09 Encounter for follow-up examination after completed treatment for conditions other than malignant neoplasm: Secondary | ICD-10-CM

## 2023-05-28 NOTE — Progress Notes (Signed)
   REFERRING PHYSICIAN:  Danella Penton, Md [address] [facility] [place],  [place] [address]  DOS: 05/16/23; Left L2-3 microdiscectomy, Left L3-4&L4-5 laminoforaminotomy  HISTORY OF PRESENT ILLNESS: Bryan Mcclain is approximately 2 weeks status post left L2-3 microdiscectomy, Left L3-4&L4-5 laminoforaminotomy.  He is doing very well.  He states the pain radiating in his back down his leg is no longer present. He reports his strength has improved tremendously.  He is only taking Tylenol for his pain management.   PHYSICAL EXAMINATION:  General: Patient is well developed, well nourished, calm, collected, and in no apparent distress.   NEUROLOGICAL:  General: In no acute distress.   Awake, alert, oriented to person, place, and time.  Pupils equal round and reactive to light.  Facial tone is symmetric.     Strength:            Side Iliopsoas Quads Hamstring PF DF EHL  R 5 5 5 5 5 5   L 5 5 5 5 5 5    Incision c/d/i   ROS (Neurologic):  Negative except as noted above   ASSESSMENT/PLAN:  Bryan Mcclain is approximately 2 weeks status post left L2-3 microdiscectomy, Left L3-4&L4-5 laminoforaminotomy.  He is doing very well.  He states the pain radiating in his back down his leg is no longer present. He reports his strength has improved tremendously.  He is only taking Tylenol for his pain management.  Strength in left lower extremity has improved. I have advised the patient to lift up to 10 pounds until 6 weeks after surgery, then increase up to 25 pounds until 12 weeks after surgery.  After 12 weeks post-op, the patient advised to increase activity as tolerated.  Advised to contact the office if any questions or concerns arise.  Joan Flores PA-C Department of neurosurgery

## 2023-05-29 ENCOUNTER — Encounter: Payer: Self-pay | Admitting: Neurosurgery

## 2023-06-11 ENCOUNTER — Encounter: Payer: Self-pay | Admitting: Neurosurgery

## 2023-06-28 ENCOUNTER — Ambulatory Visit: Payer: Medicare PPO | Admitting: Neurosurgery

## 2023-06-28 VITALS — BP 170/70 | Temp 97.8°F | Ht 72.0 in | Wt 174.4 lb

## 2023-06-28 DIAGNOSIS — R29898 Other symptoms and signs involving the musculoskeletal system: Secondary | ICD-10-CM

## 2023-06-28 DIAGNOSIS — M5416 Radiculopathy, lumbar region: Secondary | ICD-10-CM

## 2023-06-28 DIAGNOSIS — Z09 Encounter for follow-up examination after completed treatment for conditions other than malignant neoplasm: Secondary | ICD-10-CM

## 2023-06-28 NOTE — Progress Notes (Signed)
   REFERRING PHYSICIAN:  Danella Penton, Md [address] [facility] [place],  [place] [address]  DOS: 05/16/23; Left L2-3 microdiscectomy, Left L3-4&L4-5 laminoforaminotomy  HISTORY OF PRESENT ILLNESS: Bryan Mcclain is status post left L2-3 microdiscectomy, Left L3-4&L4-5 laminoforaminotomy.   He is doing extremely well.    PHYSICAL EXAMINATION:  General: Patient is well developed, well nourished, calm, collected, and in no apparent distress.   NEUROLOGICAL:  General: In no acute distress.   Awake, alert, oriented to person, place, and time.  Pupils equal round and reactive to light.  Facial tone is symmetric.     Strength:            Side Iliopsoas Quads Hamstring PF DF EHL  R 5 5 5 5 5 5   L 5 5 5 5 5 5    Incision c/d/i   ROS (Neurologic):  Negative except as noted above   ASSESSMENT/PLAN:  Bryan Mcclain is status post left L2-3 microdiscectomy, Left L3-4&L4-5 laminoforaminotomy.  He is doing very well. He has no pain.  We reviewed his activity limitations.  Will see him back in clinic in 6 weeks.     Advised to contact the office if any questions or concerns arise.  Venetia Night MD Department of neurosurgery

## 2023-08-02 ENCOUNTER — Ambulatory Visit: Payer: Medicare PPO | Admitting: Neurosurgery

## 2023-08-02 VITALS — BP 146/68 | Temp 97.7°F | Ht 72.0 in | Wt 174.0 lb

## 2023-08-02 DIAGNOSIS — Z9889 Other specified postprocedural states: Secondary | ICD-10-CM

## 2023-08-02 NOTE — Progress Notes (Signed)
   REFERRING PHYSICIAN:  Danella Penton, Md [address] [facility] [place],  [place] [address]  DOS: 05/16/23; Left L2-3 microdiscectomy, Left L3-4&L4-5 laminoforaminotomy  HISTORY OF PRESENT ILLNESS:  08/02/23 Mr. Quinley is a 78 year old status post lumbar decompression about 3 months ago.  He is doing well this morning without complaints.  06/28/23 Donna Bernard is status post left L2-3 microdiscectomy, Left L3-4&L4-5 laminoforaminotomy.   He is doing extremely well.    PHYSICAL EXAMINATION:  General: Patient is well developed, well nourished, calm, collected, and in no apparent distress.   NEUROLOGICAL:  General: In no acute distress.   Awake, alert, oriented to person, place, and time.  Pupils equal round and reactive to light.  Facial tone is symmetric.     Strength:            Side Iliopsoas Quads Hamstring PF DF EHL  R 5 5 5 5 5 5   L 5 5 5 5 5 5    Incision c/d/i   ROS (Neurologic):  Negative except as noted above   ASSESSMENT/PLAN:  Cashel Bellina is status post left L2-3 microdiscectomy, Left L3-4&L4-5 laminoforaminotomy.  He is released to resume activities as tolerated.  We will see him going forward on an as-needed basis.  He expressed understanding and was in agreement with this plan.  Manning Charity PA-C Department of neurosurgery

## 2023-08-13 ENCOUNTER — Other Ambulatory Visit: Payer: Self-pay | Admitting: Pulmonary Disease

## 2023-08-13 DIAGNOSIS — R911 Solitary pulmonary nodule: Secondary | ICD-10-CM

## 2023-08-28 DIAGNOSIS — R918 Other nonspecific abnormal finding of lung field: Secondary | ICD-10-CM

## 2023-08-28 HISTORY — DX: Other nonspecific abnormal finding of lung field: R91.8

## 2023-08-30 ENCOUNTER — Ambulatory Visit
Admission: RE | Admit: 2023-08-30 | Discharge: 2023-08-30 | Disposition: A | Discharge status: Home / Self Care | Source: Ambulatory Visit | Attending: Pulmonary Disease | Admitting: Pulmonary Disease

## 2023-08-30 DIAGNOSIS — R911 Solitary pulmonary nodule: Secondary | ICD-10-CM | POA: Diagnosis present

## 2023-09-07 ENCOUNTER — Other Ambulatory Visit: Payer: Self-pay | Admitting: Pulmonary Disease

## 2023-09-12 ENCOUNTER — Other Ambulatory Visit: Payer: Self-pay | Admitting: Pulmonary Disease

## 2023-09-12 DIAGNOSIS — R911 Solitary pulmonary nodule: Secondary | ICD-10-CM

## 2023-09-12 DIAGNOSIS — J42 Unspecified chronic bronchitis: Secondary | ICD-10-CM

## 2023-09-13 ENCOUNTER — Ambulatory Visit
Admission: RE | Admit: 2023-09-13 | Discharge: 2023-09-13 | Disposition: A | Discharge status: Home / Self Care | Source: Ambulatory Visit | Attending: Pulmonary Disease | Admitting: Pulmonary Disease

## 2023-09-13 ENCOUNTER — Encounter
Admission: RE | Admit: 2023-09-13 | Discharge: 2023-09-13 | Disposition: A | Discharge status: Home / Self Care | Source: Ambulatory Visit | Attending: Pulmonary Disease | Admitting: Pulmonary Disease

## 2023-09-13 ENCOUNTER — Other Ambulatory Visit: Payer: Self-pay

## 2023-09-13 VITALS — Ht 72.0 in | Wt 172.0 lb

## 2023-09-13 DIAGNOSIS — I10 Essential (primary) hypertension: Secondary | ICD-10-CM | POA: Diagnosis present

## 2023-09-13 DIAGNOSIS — N183 Chronic kidney disease, stage 3 unspecified: Secondary | ICD-10-CM | POA: Insufficient documentation

## 2023-09-13 DIAGNOSIS — J441 Chronic obstructive pulmonary disease with (acute) exacerbation: Secondary | ICD-10-CM

## 2023-09-13 DIAGNOSIS — Z01812 Encounter for preprocedural laboratory examination: Secondary | ICD-10-CM

## 2023-09-13 DIAGNOSIS — R911 Solitary pulmonary nodule: Secondary | ICD-10-CM | POA: Insufficient documentation

## 2023-09-13 DIAGNOSIS — J42 Unspecified chronic bronchitis: Secondary | ICD-10-CM | POA: Insufficient documentation

## 2023-09-13 DIAGNOSIS — E1122 Type 2 diabetes mellitus with diabetic chronic kidney disease: Secondary | ICD-10-CM | POA: Insufficient documentation

## 2023-09-13 DIAGNOSIS — E119 Type 2 diabetes mellitus without complications: Secondary | ICD-10-CM | POA: Insufficient documentation

## 2023-09-13 DIAGNOSIS — I129 Hypertensive chronic kidney disease with stage 1 through stage 4 chronic kidney disease, or unspecified chronic kidney disease: Secondary | ICD-10-CM | POA: Insufficient documentation

## 2023-09-13 HISTORY — DX: Squamous cell carcinoma of skin, unspecified: C44.92

## 2023-09-13 HISTORY — DX: Type 2 diabetes mellitus without complications: E11.9

## 2023-09-13 HISTORY — DX: Essential (primary) hypertension: I10

## 2023-09-13 HISTORY — DX: Chronic kidney disease, stage 3a: N18.31

## 2023-09-13 HISTORY — DX: Unspecified glaucoma: H40.9

## 2023-09-13 LAB — CBC
HCT: 42.4 % (ref 39.0–52.0)
Hemoglobin: 14 g/dL (ref 13.0–17.0)
MCH: 29.2 pg (ref 26.0–34.0)
MCHC: 33 g/dL (ref 30.0–36.0)
MCV: 88.3 fL (ref 80.0–100.0)
Platelets: 228 10*3/uL (ref 150–400)
RBC: 4.8 MIL/uL (ref 4.22–5.81)
RDW: 15.8 % — ABNORMAL HIGH (ref 11.5–15.5)
WBC: 10.8 10*3/uL — ABNORMAL HIGH (ref 4.0–10.5)
nRBC: 0 % (ref 0.0–0.2)

## 2023-09-13 LAB — BASIC METABOLIC PANEL WITH GFR
Anion gap: 9 (ref 5–15)
BUN: 21 mg/dL (ref 8–23)
CO2: 32 mmol/L (ref 22–32)
Calcium: 8.9 mg/dL (ref 8.9–10.3)
Chloride: 99 mmol/L (ref 98–111)
Creatinine, Ser: 1.48 mg/dL — ABNORMAL HIGH (ref 0.61–1.24)
GFR, Estimated: 48 mL/min — ABNORMAL LOW (ref >60)
Glucose, Bld: 100 mg/dL — ABNORMAL HIGH (ref 70–99)
Potassium: 3.7 mmol/L (ref 3.5–5.1)
Sodium: 140 mmol/L (ref 135–145)

## 2023-09-13 NOTE — Patient Instructions (Signed)
 Your procedure is scheduled on: Friday, April 18 Report to the Registration Desk on the 1st floor of the [facility] To find out your arrival time, please call [phone] between 1PM - 3PM on: Thursday, April 17 If your arrival time is 6:00 am, do not arrive before that time as the [facility] entrance doors do not open until 6:00 am.  REMEMBER: Instructions that are not followed completely may result in serious medical risk, up to and including death; or upon the discretion of your surgeon and anesthesiologist your surgery may need to be rescheduled.  Do not eat food after midnight the night before surgery.  No gum chewing or hard candies.  You may however, drink water up to 2 hours before you are scheduled to arrive for your surgery. Do not drink anything within 2 hours of your scheduled arrival time.  One week prior to surgery: Stop Anti-inflammatories (NSAIDS) such as Advil, Aleve, Ibuprofen, Motrin, Naproxen, Naprosyn and Aspirin based products such as Excedrin, Goody's Powder, BC Powder. Stop ANY OVER THE COUNTER supplements until after surgery.  You may however, continue to take Tylenol if needed for pain up until the day of surgery.  Continue taking all of your other prescription medications up until the day of surgery.  ON THE DAY OF SURGERY ONLY TAKE THESE MEDICATIONS WITH SIPS OF WATER:  diltiazem (CARDIZEM CD)  dutasteride (AVODART)  ipratropium-albuterol (DUONEB) nebulizer pantoprazole (PROTONIX)  potassium chloride  predniSONE  TRELEGY ELLIPTA inhaler  Use inhalers on the day of surgery and bring your albuterol inhaler to the hospital.  No Alcohol for 24 hours before or after surgery.  No Smoking including e-cigarettes for 24 hours before surgery.  No chewable tobacco products for at least 6 hours before surgery.  No nicotine patches on the day of surgery.  Do not use any "recreational" drugs for at least a week (preferably 2 weeks) before your surgery.   Please be advised that the combination of cocaine and anesthesia may have negative outcomes, up to and including death. If you test positive for cocaine, your surgery will be cancelled.  On the morning of surgery brush your teeth with toothpaste and water, you may rinse your mouth with mouthwash if you wish. Do not swallow any toothpaste or mouthwash.  Do not wear jewelry, make-up, hairpins, clips or nail polish.  For welded (permanent) jewelry: bracelets, anklets, waist bands, etc.  Please have this removed prior to surgery.  If it is not removed, there is a chance that hospital personnel will need to cut it off on the day of surgery.  Do not wear lotions, powders, or perfumes.   Do not shave body hair from the neck down 48 hours before surgery.  Contact lenses, hearing aids and dentures may not be worn into surgery.  Do not bring valuables to the hospital. [facility] is not responsible for any missing/lost belongings or valuables.   Notify your doctor if there is any change in your medical condition (cold, fever, infection).  Wear comfortable clothing (specific to your surgery type) to the hospital.  If you are being discharged the day of surgery, you will not be allowed to drive home. You will need a responsible individual to drive you home and stay with you for 24 hours after surgery.   If you are taking public transportation, you will need to have a responsible individual with you.  Please call the Pre-admissions Testing Dept. at [phone] if you have any questions about these  instructions.  Surgery Visitation Policy:  Patients having surgery or a procedure may have two visitors.  Children under the age of 58 must have an adult with them who is not the patient.

## 2023-09-14 ENCOUNTER — Encounter
Admission: RE | Disposition: A | Discharge status: Home / Self Care | Payer: Self-pay | Source: Home / Self Care | Attending: Pulmonary Disease

## 2023-09-14 ENCOUNTER — Ambulatory Visit: Admitting: Urgent Care

## 2023-09-14 ENCOUNTER — Ambulatory Visit: Admitting: Certified Registered"

## 2023-09-14 ENCOUNTER — Ambulatory Visit

## 2023-09-14 ENCOUNTER — Other Ambulatory Visit: Payer: Self-pay

## 2023-09-14 ENCOUNTER — Ambulatory Visit
Admission: RE | Admit: 2023-09-14 | Discharge: 2023-09-14 | Disposition: A | Discharge status: Home / Self Care | Attending: Pulmonary Disease | Admitting: Pulmonary Disease

## 2023-09-14 DIAGNOSIS — Z79899 Other long term (current) drug therapy: Secondary | ICD-10-CM | POA: Diagnosis not present

## 2023-09-14 DIAGNOSIS — I129 Hypertensive chronic kidney disease with stage 1 through stage 4 chronic kidney disease, or unspecified chronic kidney disease: Secondary | ICD-10-CM | POA: Insufficient documentation

## 2023-09-14 DIAGNOSIS — C3411 Malignant neoplasm of upper lobe, right bronchus or lung: Secondary | ICD-10-CM | POA: Diagnosis not present

## 2023-09-14 DIAGNOSIS — E1122 Type 2 diabetes mellitus with diabetic chronic kidney disease: Secondary | ICD-10-CM | POA: Diagnosis not present

## 2023-09-14 DIAGNOSIS — E119 Type 2 diabetes mellitus without complications: Secondary | ICD-10-CM

## 2023-09-14 DIAGNOSIS — G4730 Sleep apnea, unspecified: Secondary | ICD-10-CM | POA: Insufficient documentation

## 2023-09-14 DIAGNOSIS — J432 Centrilobular emphysema: Secondary | ICD-10-CM | POA: Diagnosis not present

## 2023-09-14 DIAGNOSIS — K219 Gastro-esophageal reflux disease without esophagitis: Secondary | ICD-10-CM | POA: Insufficient documentation

## 2023-09-14 DIAGNOSIS — Z87891 Personal history of nicotine dependence: Secondary | ICD-10-CM | POA: Diagnosis not present

## 2023-09-14 DIAGNOSIS — Z7951 Long term (current) use of inhaled steroids: Secondary | ICD-10-CM | POA: Diagnosis not present

## 2023-09-14 DIAGNOSIS — Z7952 Long term (current) use of systemic steroids: Secondary | ICD-10-CM | POA: Insufficient documentation

## 2023-09-14 DIAGNOSIS — N1831 Chronic kidney disease, stage 3a: Secondary | ICD-10-CM | POA: Insufficient documentation

## 2023-09-14 DIAGNOSIS — R911 Solitary pulmonary nodule: Secondary | ICD-10-CM | POA: Diagnosis present

## 2023-09-14 DIAGNOSIS — Z9981 Dependence on supplemental oxygen: Secondary | ICD-10-CM | POA: Diagnosis not present

## 2023-09-14 DIAGNOSIS — J309 Allergic rhinitis, unspecified: Secondary | ICD-10-CM | POA: Insufficient documentation

## 2023-09-14 DIAGNOSIS — J449 Chronic obstructive pulmonary disease, unspecified: Secondary | ICD-10-CM | POA: Insufficient documentation

## 2023-09-14 DIAGNOSIS — Z833 Family history of diabetes mellitus: Secondary | ICD-10-CM | POA: Insufficient documentation

## 2023-09-14 DIAGNOSIS — Z01812 Encounter for preprocedural laboratory examination: Secondary | ICD-10-CM

## 2023-09-14 DIAGNOSIS — I10 Essential (primary) hypertension: Secondary | ICD-10-CM

## 2023-09-14 HISTORY — PX: VIDEO BRONCHOSCOPY WITH ENDOBRONCHIAL ULTRASOUND: SHX6177

## 2023-09-14 HISTORY — PX: VIDEO BRONCHOSCOPY WITH ENDOBRONCHIAL NAVIGATION: SHX6175

## 2023-09-14 SURGERY — BRONCHOSCOPY, WITH EBUS
Anesthesia: General

## 2023-09-14 MED ORDER — ROCURONIUM BROMIDE 100 MG/10ML IV SOLN
INTRAVENOUS | Status: DC | PRN
  Administered 2023-09-14: 50 mg via INTRAVENOUS
  Administered 2023-09-14: 20 mg via INTRAVENOUS

## 2023-09-14 MED ORDER — ONDANSETRON HCL 4 MG/2ML IJ SOLN
INTRAMUSCULAR | Status: AC
  Filled 2023-09-14: qty 2

## 2023-09-14 MED ORDER — FENTANYL CITRATE (PF) 100 MCG/2ML IJ SOLN
INTRAMUSCULAR | Status: DC | PRN
  Administered 2023-09-14 (×2): 25 ug via INTRAVENOUS

## 2023-09-14 MED ORDER — MIDAZOLAM HCL 2 MG/2ML IJ SOLN
INTRAMUSCULAR | Status: AC
  Filled 2023-09-14: qty 2

## 2023-09-14 MED ORDER — PHENYLEPHRINE HCL-NACL 20-0.9 MG/250ML-% IV SOLN
INTRAVENOUS | Status: AC
  Filled 2023-09-14: qty 250

## 2023-09-14 MED ORDER — LACTATED RINGERS IV SOLN: INTRAVENOUS | Status: DC | PRN

## 2023-09-14 MED ORDER — LIDOCAINE HCL (PF) 2 % IJ SOLN
INTRAMUSCULAR | Status: AC
Start: 2023-09-14 — End: ?
  Filled 2023-09-14: qty 5

## 2023-09-14 MED ORDER — CHLORHEXIDINE GLUCONATE 0.12 % MT SOLN
15.0000 mL | Freq: Once | OROMUCOSAL | Status: AC
  Administered 2023-09-14: 15 mL via OROMUCOSAL

## 2023-09-14 MED ORDER — EPHEDRINE SULFATE-NACL 50-0.9 MG/10ML-% IV SOSY
PREFILLED_SYRINGE | INTRAVENOUS | Status: DC | PRN
  Administered 2023-09-14 (×2): 10 mg via INTRAVENOUS

## 2023-09-14 MED ORDER — DEXAMETHASONE SODIUM PHOSPHATE 10 MG/ML IJ SOLN
INTRAMUSCULAR | Status: AC
  Filled 2023-09-14: qty 1

## 2023-09-14 MED ORDER — ONDANSETRON HCL 4 MG/2ML IJ SOLN: 4.0000 mg | Freq: Once | INTRAMUSCULAR | Status: DC | PRN

## 2023-09-14 MED ORDER — DEXAMETHASONE SODIUM PHOSPHATE 10 MG/ML IJ SOLN
INTRAMUSCULAR | Status: DC | PRN
  Administered 2023-09-14: 10 mg via INTRAVENOUS

## 2023-09-14 MED ORDER — PROPOFOL 10 MG/ML IV BOLUS
INTRAVENOUS | Status: AC
  Filled 2023-09-14: qty 20

## 2023-09-14 MED ORDER — SODIUM CHLORIDE 0.9 % IV SOLN: INTRAVENOUS | Status: DC

## 2023-09-14 MED ORDER — FENTANYL CITRATE (PF) 100 MCG/2ML IJ SOLN
INTRAMUSCULAR | Status: AC
  Filled 2023-09-14: qty 2

## 2023-09-14 MED ORDER — LIDOCAINE HCL (CARDIAC) PF 100 MG/5ML IV SOSY
PREFILLED_SYRINGE | INTRAVENOUS | Status: DC | PRN
  Administered 2023-09-14: 60 mg via INTRAVENOUS

## 2023-09-14 MED ORDER — ONDANSETRON HCL 4 MG/2ML IJ SOLN
INTRAMUSCULAR | Status: DC | PRN
  Administered 2023-09-14: 4 mg via INTRAVENOUS

## 2023-09-14 MED ORDER — ORAL CARE MOUTH RINSE: 15.0000 mL | Freq: Once | OROMUCOSAL | Status: AC

## 2023-09-14 MED ORDER — MIDAZOLAM HCL 2 MG/2ML IJ SOLN
INTRAMUSCULAR | Status: DC | PRN
Start: 2023-09-14 — End: 2023-09-14
  Administered 2023-09-14 (×2): 1 mg via INTRAVENOUS

## 2023-09-14 MED ORDER — SUGAMMADEX SODIUM 200 MG/2ML IV SOLN
INTRAVENOUS | Status: DC | PRN
  Administered 2023-09-14: 200 mg via INTRAVENOUS

## 2023-09-14 MED ORDER — PROPOFOL 10 MG/ML IV BOLUS
INTRAVENOUS | Status: DC | PRN
  Administered 2023-09-14: 150 mg via INTRAVENOUS

## 2023-09-14 MED ORDER — CHLORHEXIDINE GLUCONATE 0.12 % MT SOLN
OROMUCOSAL | Status: AC
Start: 2023-09-14 — End: ?
  Filled 2023-09-14: qty 15

## 2023-09-14 MED ORDER — ROCURONIUM BROMIDE 10 MG/ML (PF) SYRINGE
PREFILLED_SYRINGE | INTRAVENOUS | Status: AC
  Filled 2023-09-14: qty 10

## 2023-09-14 MED ORDER — FENTANYL CITRATE (PF) 100 MCG/2ML IJ SOLN: 25.0000 ug | INTRAMUSCULAR | Status: DC | PRN

## 2023-09-14 NOTE — H&P (Signed)
 PULMONOLOGY         Date: 09/14/2023,   MRN# [id number] Bryan Mcclain 1946-05-01    CHIEF COMPLAINT:   Right upper lobe ground glass spiculated nodule   HISTORY OF PRESENT ILLNESS   This is a pleasant 78 yo M with history of COPD and lifelong smoker.  I have seen this patient in the past in clnic and we have monitored this lesion.  After most recent imaging review it is difficult to rule out lung cancer.  We plan to perform bronchoscopy with airway inspection and lung biopsy of RUL nodule. Reviewed risks/complications and benefits with patient, risks include infection, pneumothorax/pneumomediastinum which may require chest tube placement as well as overnight/prolonged hospitalization and possible mechanical ventilation. Other risks include bleeding and very rarely death.  Patient understands risks and wishes to proceed.  Additional questions were answered, and patient is aware that post procedure patient will be going home with family and may experience cough with possible clots on expectoration as well as phlegm which may last few days as well as hoarseness of voice post intubation and mechanical ventilation.    PAST MEDICAL HISTORY   Past Medical History:  Diagnosis Date   CAP (community acquired pneumonia)    Cluster headache    Complication of anesthesia    COPD (chronic obstructive pulmonary disease) (HCC)    Diabetes mellitus type 2, noninsulin dependent (HCC)    Dyspnea    Essential hypertension    GERD (gastroesophageal reflux disease)    Glaucoma    Hyperlipidemia    Lumbar radiculopathy    Lumbar stenosis    Lung nodules 08/2023   PONV (postoperative nausea and vomiting)    Sleep apnea    cannot tolerate cpap   Squamous cell skin cancer    Stage 3a chronic kidney disease (CKD) (HCC)    Vitamin B 12 deficiency      SURGICAL HISTORY   Past Surgical History:  Procedure Laterality Date   APPENDECTOMY  1967   BRONCHOSCOPY  01/20/2022   CATARACT  EXTRACTION Bilateral 2021   COLONOSCOPY     FORAMINOTOMY 2 LEVEL Left 05/16/2023   Procedure: LEFT L3-4 & L4-5 LAMINOFORAMINOTOMY;  Surgeon: Jodeen Munch, MD;  Location: [facility];  Service: Neurosurgery;  Laterality: Left;   LUMBAR LAMINECTOMY     2008, 2010, 2012   LUMBAR LAMINECTOMY/DECOMPRESSION MICRODISCECTOMY Left 05/16/2023   Procedure: LEFT L2-3 MICRODISCECTOMY;  Surgeon: Jodeen Munch, MD;  Location: [facility];  Service: Neurosurgery;  Laterality: Left;   NASAL DERMOID CYST EXCISION     NASAL SEPTUM SURGERY     PILONIDAL CYST EXCISION  1983     FAMILY HISTORY   Family History  Problem Relation Age of Onset   Diabetes Mother      SOCIAL HISTORY   Social History   Tobacco Use   Smoking status: Former    Current packs/day: 0.00    Average packs/day: 1 pack/day for 42.0 years (42.0 ttl pk-yrs)    Types: Cigarettes    Start date: 11/02/1979    Quit date: 11/01/2021    Years since quitting: 1.8   Smokeless tobacco: Never  Vaping Use   Vaping status: Never Used  Substance Use Topics   Alcohol use: Not Currently   Drug use: Not Currently     MEDICATIONS     Current Medication:  Current Facility-Administered Medications:    0.9 %  sodium chloride  infusion, , Intravenous, Continuous, Baltazar Bonier, MD, Last Rate: 10 mL/hr  at 09/14/23 1140, New Bag at 09/14/23 1140    ALLERGIES   Moxifloxacin, Ciprofloxacin, and Hydrocodone     REVIEW OF SYSTEMS    Review of Systems:  Gen:  Denies  fever, sweats, chills weigh loss  HEENT: Denies blurred vision, double vision, ear pain, eye pain, hearing loss, nose bleeds, sore throat Cardiac:  No dizziness, chest pain or heaviness, chest tightness,edema Resp:   reports dyspnea chronically  Gi: Denies swallowing difficulty, stomach pain, nausea or vomiting, diarrhea, constipation, bowel incontinence Gu:  Denies bladder incontinence, burning urine Ext:   Denies Joint pain, stiffness or swelling Skin:  Denies  skin rash, easy bruising or bleeding or hives Endoc:  Denies polyuria, polydipsia , polyphagia or weight change Psych:   Denies depression, insomnia or hallucinations   Other:  All other systems negative   VS: BP 130/71   Pulse 63   Temp 98.2 F (36.8 C) (Temporal)   Resp 16   SpO2 96%      PHYSICAL EXAM    GENERAL:NAD, no fevers, chills, no weakness no fatigue HEAD: Normocephalic, atraumatic.  EYES: Pupils equal, round, reactive to light. Extraocular muscles intact. No scleral icterus.  MOUTH: Moist mucosal membrane. Dentition intact. No abscess noted.  EAR, NOSE, THROAT: Clear without exudates. No external lesions.  NECK: Supple. No thyromegaly. No nodules. No JVD.  PULMONARY: decreased breath sounds with mild rhonchi worse at bases bilaterally.  CARDIOVASCULAR: S1 and S2. Regular rate and rhythm. No murmurs, rubs, or gallops. No edema. Pedal pulses 2+ bilaterally.  GASTROINTESTINAL: Soft, nontender, nondistended. No masses. Positive bowel sounds. No hepatosplenomegaly.  MUSCULOSKELETAL: No swelling, clubbing, or edema. Range of motion full in all extremities.  NEUROLOGIC: Cranial nerves II through XII are intact. No gross focal neurological deficits. Sensation intact. Reflexes intact.  SKIN: No ulceration, lesions, rashes, or cyanosis. Skin warm and dry. Turgor intact.  PSYCHIATRIC: Mood, affect within normal limits. The patient is awake, alert and oriented x 3. Insight, judgment intact.       IMAGING   arrative & Impression  CLINICAL DATA:  Lung nodule follow-up   EXAM: CT CHEST WITHOUT CONTRAST   TECHNIQUE: Multidetector CT imaging of the chest was performed following the standard protocol without IV contrast.   RADIATION DOSE REDUCTION: This exam was performed according to the departmental dose-optimization program which includes automated exposure control, adjustment of the mA and/or kV according to patient size and/or use of iterative reconstruction  technique.   COMPARISON:  PET-CT February 14, 2023 CT chest Oct 18, 2022   FINDINGS: Cardiovascular: No significant vascular findings. Normal heart size. No pericardial effusion. Significant coronary artery calcifications. Mild pericardial thickening along the anterior pericardium unchanged since prior examination measuring 5 mm in thickness. Questionable minute pericardial effusion, correlate with echocardiography as clinically needed.   Mediastinum/Nodes: No enlarged mediastinal or axillary lymph nodes. Thyroid gland, trachea, and esophagus demonstrate no significant findings.   Lungs/Pleura: Prominence of the interstitial markings bilaterally with mild centrilobular emphysema. Bilateral basilar chronic atelectatic changes   4.5 mm nodule left upper lobe anteriorly image 75   1.7 cm bilobed nodule of the left lower lobe stable since prior examination.   1.3 cm ground-glass appearing semi-solid nodular area within the right upper lobe image 62 unchanged since prior examination.   There is no other pulmonary nodules. No infiltrates or consolidations.   Upper Abdomen: Calcifications within the right kidney correlate with lithiasis without obvious hydronephrosis.   Musculoskeletal: No chest wall mass or suspicious bone lesions identified.  With multilevel degenerative disc disease of the thoracic spine   IMPRESSION: *Stable pulmonary nodules as described above. Lung-RADS 2, benign appearance or behavior. Continue annual screening with low-dose chest CT without contrast in 12 months. *Mild pericardial thickening along the anterior pericardium unchanged since prior examination measuring 5 mm in thickness. Questionable minute pericardial effusion, correlate with echocardiography as clinically needed. *Significant coronary artery calcifications. *Right nephrolithiasis without obvious hydronephrosis. *Multilevel degenerative disc disease of the thoracic spine. *Prominence of  the interstitial markings bilaterally with mild centrilobular emphysema. *Bilateral basilar chronic atelectatic changes. *1.3 cm ground-glass appearing semi-solid nodular area within the right upper lobe unchanged since prior examination. *4.5 mm nodule left upper lobe anteriorly stable since prior examination. *1.7 cm bilobed nodule of the left lower lobe stable since prior examination.     Electronically Signed   By: Fredrich Jefferson M.D.   On: 08/30/2023 09:26    ASSESSMENT/PLAN   Part solid 1.3cm right upper lobe nodule   Plan for robotic bronchoscopy with lung biopsy.    - therapeutic aspiration of noted mucus plugging from COPD   - BAL for cytology and microbiology   - endobronchial ultrasound to evaluate mediastinal and hilar lymph nodes.   -Reviewed risks/complications and benefits with patient, risks include infection, pneumothorax/pneumomediastinum which may require chest tube placement as well as overnight/prolonged hospitalization and possible mechanical ventilation. Other risks include bleeding and very rarely death.  Patient understands risks and wishes to proceed.  Additional questions were answered, and patient is aware that post procedure patient will be going home with family and may experience cough with possible clots on expectoration as well as phlegm which may last few days as well as hoarseness of voice post intubation and mechanical ventilation.            Thank you for allowing me to participate in the care of this patient.   Patient/Family are satisfied with care plan and all questions have been answered.    Provider disclosure: Patient with at least one acute or chronic illness or injury that poses a threat to life or bodily function and is being managed actively during this encounter.  All of the below services have been performed independently by signing provider:  review of prior documentation from internal and or external health records.  Review of  previous and current lab results.  Interview and comprehensive assessment during patient visit today. Review of current and previous chest radiographs/CT scans. Discussion of management and test interpretation with health care team and patient/family.   This document was prepared using Dragon voice recognition software and may include unintentional dictation errors.     Lucilia Yanni, M.D.  Division of Pulmonary & Critical Care Medicine

## 2023-09-14 NOTE — Anesthesia Procedure Notes (Signed)
 Procedure Name: Intubation Date/Time: 09/14/2023 1:37 PM  Performed by: Bill Budd, CRNAPre-anesthesia Checklist: Patient identified, Patient being monitored, Timeout performed, Emergency Drugs available and Suction available Patient Re-evaluated:Patient Re-evaluated prior to induction Oxygen Delivery Method: Circle system utilized Preoxygenation: Pre-oxygenation with 100% oxygen Induction Type: IV induction Ventilation: Mask ventilation without difficulty Laryngoscope Size: Mac and 3 Grade View: Grade I Tube type: Oral Tube size: 8.5 mm Number of attempts: 1 Airway Equipment and Method: Stylet Placement Confirmation: ETT inserted through vocal cords under direct vision, positive ETCO2 and breath sounds checked- equal and bilateral Secured at: 21 cm Tube secured with: Tape Dental Injury: Teeth and Oropharynx as per pre-operative assessment

## 2023-09-14 NOTE — Anesthesia Postprocedure Evaluation (Signed)
 Anesthesia Post Note  Patient: Bryan Mcclain  Procedure(s) Performed: BRONCHOSCOPY, WITH EBUS VIDEO BRONCHOSCOPY WITH ENDOBRONCHIAL NAVIGATION  Patient location during evaluation: PACU Anesthesia Type: General Level of consciousness: awake and alert Pain management: pain level controlled Vital Signs Assessment: post-procedure vital signs reviewed and stable Respiratory status: spontaneous breathing, nonlabored ventilation, respiratory function stable and patient connected to nasal cannula oxygen Cardiovascular status: blood pressure returned to baseline and stable Postop Assessment: no apparent nausea or vomiting Anesthetic complications: no  No notable events documented.   Last Vitals:  Vitals:   09/14/23 1535 09/14/23 1557  BP: 124/74   Pulse: 84   Resp: 18   Temp: (!) 36.1 C   SpO2:  93%    Last Pain:  Vitals:   09/14/23 1535  TempSrc: Temporal  PainSc: 0-No pain                 Enrique Harvest

## 2023-09-14 NOTE — Anesthesia Preprocedure Evaluation (Addendum)
 Anesthesia Evaluation  Patient identified by MRN, date of birth, ID band Patient awake    Reviewed: Allergy & Precautions, NPO status , Patient's Chart, lab work & pertinent test results  History of Anesthesia Complications (+) PONV and history of anesthetic complications  Airway Mallampati: III  TM Distance: >3 FB Neck ROM: full    Dental  (+) Teeth Intact   Pulmonary shortness of breath, sleep apnea , COPD,  COPD inhaler, former smoker   Pulmonary exam normal  + decreased breath sounds      Cardiovascular Exercise Tolerance: Poor hypertension, Pt. on medications Normal cardiovascular exam Rhythm:Regular Rate:Normal     Neuro/Psych negative neurological ROS  negative psych ROS   GI/Hepatic negative GI ROS, Neg liver ROS,GERD  Medicated,,  Endo/Other  diabetes, Type 2, Oral Hypoglycemic Agents    Renal/GU   negative genitourinary   Musculoskeletal   Abdominal   Peds negative pediatric ROS (+)  Hematology negative hematology ROS (+)   Anesthesia Other Findings Past Medical History: No date: CAP (community acquired pneumonia) No date: Cluster headache No date: Complication of anesthesia No date: COPD (chronic obstructive pulmonary disease) (HCC) No date: Diabetes mellitus type 2, noninsulin dependent (HCC) No date: Dyspnea No date: Essential hypertension No date: GERD (gastroesophageal reflux disease) No date: Glaucoma No date: Hyperlipidemia No date: Lumbar radiculopathy No date: Lumbar stenosis 08/2023: Lung nodules No date: PONV (postoperative nausea and vomiting) No date: Sleep apnea     Comment:  cannot tolerate cpap No date: Squamous cell skin cancer No date: Stage 3a chronic kidney disease (CKD) (HCC) No date: Vitamin B 12 deficiency  Past Surgical History: 1967: APPENDECTOMY 01/20/2022: BRONCHOSCOPY 2021: CATARACT EXTRACTION; Bilateral No date: COLONOSCOPY 05/16/2023: FORAMINOTOMY 2 LEVEL;  Left     Comment:  Procedure: LEFT L3-4 & L4-5 LAMINOFORAMINOTOMY;                Surgeon: Clois Fret, MD;  Location: [facility];                Service: Neurosurgery;  Laterality: Left; No date: LUMBAR LAMINECTOMY     Comment:  2008, 2010, 2012 05/16/2023: LUMBAR LAMINECTOMY/DECOMPRESSION MICRODISCECTOMY; Left     Comment:  Procedure: LEFT L2-3 MICRODISCECTOMY;  Surgeon:               Clois Fret, MD;  Location: [facility] OR[facility];  Service:               Neurosurgery;  Laterality: Left; No date: NASAL DERMOID CYST EXCISION No date: NASAL SEPTUM SURGERY 1983: PILONIDAL CYST EXCISION     Reproductive/Obstetrics negative OB ROS                             Anesthesia Physical Anesthesia Plan  ASA: 3  Anesthesia Plan: General   Post-op Pain Management:    Induction: Intravenous  PONV Risk Score and Plan: Ondansetron , Dexamethasone , Midazolam  and Treatment may vary due to age or medical condition  Airway Management Planned: Oral ETT  Additional Equipment:   Intra-op Plan:   Post-operative Plan: Extubation in OR  Informed Consent: I have reviewed the patients History and Physical, chart, labs and discussed the procedure including the risks, benefits and alternatives for the proposed anesthesia with the patient or authorized representative who has indicated his/her understanding and acceptance.     Dental Advisory Given  Plan Discussed with: CRNA  Anesthesia Plan Comments:        Anesthesia Quick  Evaluation

## 2023-09-14 NOTE — Procedures (Signed)
 ROBOTIC NAVIGATIONAL BRONCHOSCOPY PROCEDURE NOTE  FIBEROPTIC BRONCHOSCOPY WITH BRONCHOALVEOLAR LAVAGE and THERAPEUTIC ASPIRATION OF TRACHEOBRONCHIAL TREE PROCEDURE NOTE  ENDOBRONCHIAL ULTRASOUND PROCEDURE NOTE    Flexible bronchoscopy was performed  by : Parris MD  assistance by : 1)Repiratory therapist  and 2) [employer] staff and 3) Anesthesia team and 4) Flouroscopy team and 5) [employer] supporting staff   Indication for the procedure was :  Pre-procedural H&P. The following assessment was performed on the day of the procedure prior to initiating sedation History:  Chest pain n Dyspnea y Hemoptysis n Cough y Fever n Other pertinent items n  Examination Vital signs -reviewed as per nursing documentation today Cardiac    Murmurs: n  Rubs : n  Gallop: n Lungs Wheezing: n Rales : n Rhonchi :y  Other pertinent findings: SOB/hypoxemia due to chronic lung disease   Pre-procedural assessment for Procedural Sedation included: Depth of sedation: As per anesthesia team  ASA Classification:  2 Mallampati airway assessment: 3    Medication list reviewed: y  The patient's interval history was taken and revealed: no new complaints The pre- procedure physical examination revealed: No new findings Refer to prior clinic note for details.  Informed Consent: Informed consent was obtained from:  patient after explanation of procedure and risks, benefits, as well as alternative procedures available.  Explanation of level of sedation and possible transfusion was also provided.    Procedural Preparation: Time out was performed and patient was identified by name and birthdate and procedure to be performed and side for sampling, if any, was specified. Pt was intubated by anesthesia.  The patient was appropriately draped.   Fiberoptic bronchoscopy with airway inspection and BAL Procedure findings:  Bronchoscope was inserted via ETT  without difficulty.  Posterior oropharynx,  epiglottis, arytenoids, false cords and vocal cords were not visualized as these were bypassed by endotracheal tube. The distal trachea was normal in circumference and appearance without mucosal, cartilaginous or branching abnormalities.  The main carina was mildly splayed . All right and left lobar airways were visualized to the Subsegmental level.  Sub- sub segmental carinae were identified in all the distal airways.   Secretions were visible in the following airways and appeared to be clear.  The mucosa was : friable at RUL  Airways were notable for:        exophytic lesions :n       extrinsic compression in the following distributions: n.       Friable mucosa: y       Teacher, music /pigmentation: n     Media Information  Document Information   Media Information  Document Information  Severe mucus plugging bilaterally   Post procedure Diagnosis:   Mucus plugging bilaterally     Robotic  Navigational Bronchoscopy Procedure Findings:    Post appropriate planning and registration peripheral navigation was used to visualize target lesion.    Target #1 - Right upper lobe - transbronchial FNA x 7  Post procedure diagnosis: atypical cells consistent with lung cancer       Endobronchial ultrasound assisted hilar and mediastinal lymph node biopsies procedure findings: The fiberoptic bronchoscope was removed and the EBUS scope was introduced. Examination began to evaluate for pathologically enlarged lymph nodes starting on the left side.  All lymph node stations appeared normal and less then 6mm therefore did not require biopsy.    Post procedure diagnosis:  normal lymph node stations   Specimens obtained included: Broncho-alveolar lavage site:RUL  sent for cytology  20ml volume infused 10ml volume returned with cellular appearance  Endoscopic Radial Ultrasound Used: YES  ;     Fluoroscopy Used: YES;                    Transbronchial  WANG needle aspiration site: RUL sent for: cytology                                      Immediate sampling complications included:none  Epinephrine  ZERO ml was used topically  The bronchoscopy was terminated due to completion of the planned procedure and the bronchoscope was removed.   Total dosage of Lidocaine  was 3mg  Total fluoroscopy time was AS PER RADS  minutes  Supplemental oxygen was provided at AS PER ANESTHESIA lpm by nasal canula post operatively  Estimated Blood loss: MINIMAL cc.  Complications included:  NONE   Preliminary CXR findings :  IN PROCESS   Disposition: HOME WITH WIFE   Follow up with Dr. Shardee Dieu in 5 days for result discussion.     Halina Picking MD  [facility] Division of Pulmonary & Critical Care Medicine

## 2023-09-14 NOTE — Transfer of Care (Signed)
 Immediate Anesthesia Transfer of Care Note  Patient: Bryan Mcclain  Procedure(s) Performed: BRONCHOSCOPY, WITH EBUS VIDEO BRONCHOSCOPY WITH ENDOBRONCHIAL NAVIGATION  Patient Location: PACU  Anesthesia Type:General  Level of Consciousness: drowsy and patient cooperative  Airway & Oxygen Therapy: Patient Spontanous Breathing and Patient connected to nasal cannula oxygen  Post-op Assessment: Report given to RN and Post -op Vital signs reviewed and stable  Post vital signs: Reviewed and stable  Last Vitals:  Vitals Value Taken Time  BP 139/65 09/14/23 1515  Temp 36.6 C 09/14/23 1515  Pulse 72 09/14/23 1527  Resp 18 09/14/23 1527  SpO2 96 % 09/14/23 1527  Vitals shown include unfiled device data.  Last Pain:  Vitals:   09/14/23 1515  TempSrc:   PainSc: 0-No pain         Complications: No notable events documented.

## 2023-09-17 ENCOUNTER — Encounter: Payer: Self-pay | Admitting: Pulmonary Disease

## 2023-09-18 ENCOUNTER — Inpatient Hospital Stay: Admission: RE | Admit: 2023-09-18 | Source: Ambulatory Visit

## 2023-09-18 LAB — CYTOLOGY - NON PAP

## 2023-09-25 ENCOUNTER — Encounter: Payer: Self-pay | Admitting: Pulmonary Disease

## 2023-09-26 ENCOUNTER — Encounter: Payer: Self-pay | Admitting: Oncology

## 2023-09-26 ENCOUNTER — Encounter: Payer: Self-pay | Admitting: *Deleted

## 2023-09-26 ENCOUNTER — Inpatient Hospital Stay: Admitting: Oncology

## 2023-09-26 ENCOUNTER — Ambulatory Visit
Admission: RE | Admit: 2023-09-26 | Discharge: 2023-09-26 | Disposition: A | Discharge status: Home / Self Care | Source: Ambulatory Visit | Attending: Radiation Oncology | Admitting: Radiation Oncology

## 2023-09-26 ENCOUNTER — Inpatient Hospital Stay

## 2023-09-26 VITALS — BP 150/58 | HR 68 | Resp 18 | Ht 72.0 in

## 2023-09-26 VITALS — BP 150/58 | HR 68 | Resp 18 | Ht 72.0 in | Wt 170.5 lb

## 2023-09-26 DIAGNOSIS — Z7952 Long term (current) use of systemic steroids: Secondary | ICD-10-CM | POA: Insufficient documentation

## 2023-09-26 DIAGNOSIS — G4730 Sleep apnea, unspecified: Secondary | ICD-10-CM | POA: Insufficient documentation

## 2023-09-26 DIAGNOSIS — C3491 Malignant neoplasm of unspecified part of right bronchus or lung: Secondary | ICD-10-CM | POA: Insufficient documentation

## 2023-09-26 DIAGNOSIS — E538 Deficiency of other specified B group vitamins: Secondary | ICD-10-CM | POA: Insufficient documentation

## 2023-09-26 DIAGNOSIS — C3411 Malignant neoplasm of upper lobe, right bronchus or lung: Secondary | ICD-10-CM | POA: Insufficient documentation

## 2023-09-26 DIAGNOSIS — J449 Chronic obstructive pulmonary disease, unspecified: Secondary | ICD-10-CM | POA: Insufficient documentation

## 2023-09-26 DIAGNOSIS — Z87891 Personal history of nicotine dependence: Secondary | ICD-10-CM | POA: Insufficient documentation

## 2023-09-26 DIAGNOSIS — E785 Hyperlipidemia, unspecified: Secondary | ICD-10-CM | POA: Diagnosis not present

## 2023-09-26 DIAGNOSIS — Z79899 Other long term (current) drug therapy: Secondary | ICD-10-CM | POA: Diagnosis not present

## 2023-09-26 DIAGNOSIS — I10 Essential (primary) hypertension: Secondary | ICD-10-CM | POA: Insufficient documentation

## 2023-09-26 DIAGNOSIS — N1831 Chronic kidney disease, stage 3a: Secondary | ICD-10-CM | POA: Diagnosis not present

## 2023-09-26 DIAGNOSIS — M5134 Other intervertebral disc degeneration, thoracic region: Secondary | ICD-10-CM | POA: Insufficient documentation

## 2023-09-26 DIAGNOSIS — Z85828 Personal history of other malignant neoplasm of skin: Secondary | ICD-10-CM | POA: Diagnosis not present

## 2023-09-26 DIAGNOSIS — K219 Gastro-esophageal reflux disease without esophagitis: Secondary | ICD-10-CM | POA: Diagnosis not present

## 2023-09-26 DIAGNOSIS — C3490 Malignant neoplasm of unspecified part of unspecified bronchus or lung: Secondary | ICD-10-CM

## 2023-09-26 DIAGNOSIS — R918 Other nonspecific abnormal finding of lung field: Secondary | ICD-10-CM

## 2023-09-26 DIAGNOSIS — J432 Centrilobular emphysema: Secondary | ICD-10-CM | POA: Diagnosis not present

## 2023-09-26 DIAGNOSIS — E119 Type 2 diabetes mellitus without complications: Secondary | ICD-10-CM | POA: Diagnosis not present

## 2023-09-26 NOTE — Consult Note (Signed)
 NEW PATIENT EVALUATION  Name: Bryan Mcclain  MRN: [id number]  Date:   09/26/2023     DOB: 01/18/1946   This 78 y.o. 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   PREVIOUS INVESTIGATIONS:  CT scans reviewed PET CT scans before Monday Clinical notes reviewed Cytology pathology reports reviewed  HPI: Patient is a 78 year old male being followed by pulmonology for multiple nodules within bilateral lungs.  He has a 1.3 cm groundglass appearing semisolid nodularity within the right upper lobe which has been slowly growing fairly stable over the last exams.  Patient does have a history of COPD and is a lifelong smoker.  He underwent bronchoscopy of the right upper lobe which showed atypical cells consistent with lung cancer.  All lymph node stations on EBUS were normal.  Tumor cells were atypical large cells suspicious for adenocarcinoma.  Patient does have multiple medical comorbidities including COPD type 2 diabetes essential hypertension GERD hyperlipidemia sleep apnea stage IIIa chronic renal disease as well as squamous cell carcinoma of the skin.  He is fairly asymptomatic this has a mild nonproductive cough no hemoptysis chest tightness or recent change in pulmonary status.  PLANNED TREATMENT REGIMEN: Review PET scan and possible SBRT  PAST MEDICAL HISTORY:  has a past medical history of CAP (community acquired pneumonia), Cluster headache, Complication of anesthesia, COPD (chronic obstructive pulmonary disease) (HCC), Diabetes mellitus type 2, noninsulin dependent (HCC), Dyspnea, Essential hypertension, GERD (gastroesophageal reflux disease), Glaucoma, Hyperlipidemia, Lumbar radiculopathy, Lumbar stenosis, Lung  nodules (08/2023), PONV (postoperative nausea and vomiting), Sleep apnea, Squamous cell skin cancer, Stage 3a chronic kidney disease (CKD) (HCC), and Vitamin B 12 deficiency.    PAST SURGICAL HISTORY:  Past Surgical History:  Procedure Laterality Date   APPENDECTOMY  1967   BRONCHOSCOPY  01/20/2022   CATARACT EXTRACTION Bilateral 2021   COLONOSCOPY     FORAMINOTOMY 2 LEVEL Left 05/16/2023   Procedure: LEFT L3-4 & L4-5 LAMINOFORAMINOTOMY;  Surgeon: Jodeen Munch, MD;  Location: [facility];  Service: Neurosurgery;  Laterality: Left;   LUMBAR LAMINECTOMY     2008, 2010, 2012   LUMBAR LAMINECTOMY/DECOMPRESSION MICRODISCECTOMY Left 05/16/2023   Procedure: LEFT L2-3 MICRODISCECTOMY;  Surgeon: Jodeen Munch, MD;  Location: [facility];  Service: Neurosurgery;  Laterality: Left;   NASAL DERMOID CYST EXCISION     NASAL SEPTUM SURGERY     PILONIDAL CYST EXCISION  1983   VIDEO BRONCHOSCOPY WITH ENDOBRONCHIAL NAVIGATION N/A 09/14/2023   Procedure: VIDEO BRONCHOSCOPY WITH ENDOBRONCHIAL NAVIGATION;  Surgeon: Erskin Hearing, MD;  Location: [facility];  Service: Thoracic;  Laterality: N/A;   VIDEO BRONCHOSCOPY WITH ENDOBRONCHIAL ULTRASOUND N/A 09/14/2023   Procedure: BRONCHOSCOPY, WITH EBUS;  Surgeon: Erskin Hearing, MD;  Location: [facility];  Service: Thoracic;  Laterality: N/A;    FAMILY HISTORY: family history includes Diabetes in his mother.  SOCIAL HISTORY:  reports that he quit smoking about 22 months ago. His smoking use included cigarettes. He started smoking about 43 years ago. He has a 42 pack-year smoking history. He has never used smokeless tobacco. He reports that he does not currently use alcohol. He reports that he does not currently use drugs.  ALLERGIES: Moxifloxacin, Ciprofloxacin, and Hydrocodone  MEDICATIONS:  Current Outpatient Medications  Medication  Sig Dispense Refill   acetaminophen  (TYLENOL ) 500 MG tablet Take 1,000 mg by mouth every 6 (six) hours as needed.      albuterol  (VENTOLIN  HFA) 108 (90 Base) MCG/ACT inhaler Inhale 1-2 puffs into the lungs every 6 (six) hours as needed for shortness of breath or wheezing.     alfuzosin  (UROXATRAL ) 10 MG 24 hr tablet Take 10 mg by mouth every evening.     cetirizine (ZYRTEC) 10 MG tablet Take 10 mg by mouth as needed.     Cyanocobalamin (VITAMIN B-12) 1000 MCG/15ML LIQD Take 15 mLs by mouth daily.     diltiazem  (CARDIZEM  CD) 180 MG 24 hr capsule Take 180 mg by mouth in the morning and at bedtime.     diphenhydramine-acetaminophen  (TYLENOL  PM EXTRA STRENGTH) 25-500 MG TABS tablet Take 1 tablet by mouth at bedtime as needed.     dutasteride  (AVODART ) 0.5 MG capsule Take 0.5 mg by mouth every other day. AM-Alternates with Bactrim      fluticasone  (FLONASE ) 50 MCG/ACT nasal spray Place 2 sprays into both nostrils as needed for allergies.     guaiFENesin  (MUCINEX ) 600 MG 12 hr tablet Take 600 mg by mouth every morning.     ipratropium-albuterol  (DUONEB) 0.5-2.5 (3) MG/3ML SOLN Take 3 mLs by nebulization 3 (three) times daily.     latanoprost  (XALATAN ) 0.005 % ophthalmic solution Place 1 drop into both eyes at bedtime.     loratadine  (CLARITIN ) 10 MG tablet Take 10 mg by mouth daily as needed for allergies.     Melatonin 10 MG CAPS Take 10 mg by mouth at bedtime.     OXYGEN Inhale 2 L into the lungs at bedtime.     oxymetazoline (AFRIN) 0.05 % nasal spray Place 1 spray into both nostrils as needed for congestion.     pantoprazole  (PROTONIX ) 40 MG tablet Take 40 mg by mouth daily as needed (acid reflux).     potassium chloride  (KLOR-CON ) 8 MEQ tablet Take 8 mEq by mouth every morning.     predniSONE  (DELTASONE ) 2.5 MG tablet Take 2.5 mg by mouth every morning.     simvastatin  (ZOCOR ) 40 MG tablet Take 40 mg by mouth at bedtime.     sulfamethoxazole -trimethoprim  (BACTRIM ) 400-80 MG tablet Take 1 tablet by mouth every other day. FOR COPD-Alternates with Dutasteride      TRELEGY ELLIPTA 200-62.5-25 MCG/ACT AEPB Inhale 1 puff  into the lungs every morning.     No current facility-administered medications for this encounter.    ECOG PERFORMANCE STATUS:  0 - Asymptomatic  REVIEW OF SYSTEMS: Patient does have history of CAP cluster headaches COPD type 2 diabetes dyspnea essential hypertension GERD glaucoma hyperlipidemia lumbar radiculopathy sleep apnea squamous of carcinoma the skin. Patient denies any weight loss, fatigue, weakness, fever, chills or night sweats. Patient denies any loss of vision, blurred vision. Patient denies any ringing  of the ears or hearing loss. No irregular heartbeat. Patient denies heart murmur or history of fainting. Patient denies any chest pain or pain radiating to her upper extremities. Patient denies any shortness of breath, difficulty breathing at night, cough or hemoptysis. Patient denies any swelling in the lower legs. Patient denies any nausea vomiting, vomiting of blood, or coffee ground material in the vomitus. Patient denies any stomach pain. Patient states has had normal bowel movements no significant constipation or diarrhea. Patient denies any dysuria, hematuria or significant nocturia. Patient denies any problems walking, swelling in the joints or loss of balance. Patient denies any skin  changes, loss of hair or loss of weight. Patient denies any excessive worrying or anxiety or significant depression. Patient denies any problems with insomnia. Patient denies excessive thirst, polyuria, polydipsia. Patient denies any swollen glands, patient denies easy bruising or easy bleeding. Patient denies any recent infections, allergies or URI. Patient "s visual fields have not changed significantly in recent time.   PHYSICAL EXAM: BP (!) 150/58   Pulse 68   Resp 18   Ht 6' (1.829 m)   Wt 170 lb 8 oz (77.3 kg)   BMI 23.12 kg/m  Well-developed well-nourished patient in NAD. HEENT reveals PERLA, EOMI, discs not visualized.  Oral cavity is clear. No oral mucosal lesions are identified. Neck is  clear without evidence of cervical or supraclavicular adenopathy. Lungs are clear to A&P. Cardiac examination is essentially unremarkable with regular rate and rhythm without murmur rub or thrill. Abdomen is benign with no organomegaly or masses noted. Motor sensory and DTR levels are equal and symmetric in the upper and lower extremities. Cranial nerves II through XII are grossly intact. Proprioception is intact. No peripheral adenopathy or edema is identified. No motor or sensory levels are noted. Crude visual fields are within normal range.  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  PLAN: At this time I like to review his PET CT scan should.  Should there be any hypermetabolic activity would immediately start planning for SBRT to that lesion.  Otherwise will discuss with patient and his wife possibility of repeating CT scan in 3 3 months and if there is progression we will go ahead with SBRT at that time.  Patient may opt for immediate SBRT even if there is low hypermetabolic activity in this lesion which I do suspect will be the case.  Patient and wife both comprehend my recommendations well.  Will see him in follow-up the end of next week.  Patient and wife both comprehend the recommendations well.  I would like to take this opportunity to thank you for allowing me to participate in the care of your patient.Glenis Langdon, MD

## 2023-09-26 NOTE — Progress Notes (Signed)
 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  INTERVAL HISTORY: Patient is a 78 year old male who underwent lung screening CT scan was noted to have a semisolid nodular area in the right upper lobe.  Subsequent bronchoscopy revealed the above-stated malignancy.  He currently feels well and is asymptomatic.  He has no neurological plaints.  He denies any recent fevers or illnesses.  He has a good appetite and denies weight loss.  He has no chest pain, shortness of breath, cough, or hemoptysis.  He denies any nausea, vomiting, constipation, or diarrhea.  He has no urinary complaints.  Patient feels at his baseline and offers no specific complaints today.  REVIEW OF SYSTEMS:   Review of Systems  Constitutional: Negative.  Negative for fever, malaise/fatigue and weight loss.  Respiratory: Negative.  Negative for cough, hemoptysis and shortness of breath.   Cardiovascular: Negative.  Negative for chest pain and leg swelling.  Gastrointestinal: Negative.  Negative for abdominal pain.  Genitourinary: Negative.  Negative for dysuria.  Musculoskeletal: Negative.  Negative for back pain.  Skin: Negative.  Negative for rash.  Neurological: Negative.  Negative for dizziness, focal weakness, weakness and headaches.  Psychiatric/Behavioral: Negative.  The patient is not nervous/anxious.     As per HPI. Otherwise, a complete review of systems is negative.  PAST MEDICAL HISTORY: Past Medical History:  Diagnosis Date   CAP  (community acquired pneumonia)    Cluster headache    Complication of anesthesia    COPD (chronic obstructive pulmonary disease) (HCC)    Diabetes mellitus type 2, noninsulin dependent (HCC)    Dyspnea    Essential hypertension    GERD (gastroesophageal reflux disease)    Glaucoma    Hyperlipidemia    Lumbar radiculopathy    Lumbar stenosis    Lung nodules 08/2023   PONV (postoperative nausea and vomiting)    Sleep apnea    cannot tolerate cpap   Squamous cell skin cancer    Stage 3a chronic kidney disease (CKD) (HCC)    Vitamin B 12 deficiency     PAST SURGICAL HISTORY: Past Surgical History:  Procedure Laterality Date   APPENDECTOMY  1967   BRONCHOSCOPY  01/20/2022   CATARACT EXTRACTION Bilateral 2021   COLONOSCOPY     FORAMINOTOMY 2 LEVEL Left 05/16/2023   Procedure: LEFT L3-4 & L4-5 LAMINOFORAMINOTOMY;  Surgeon: Jodeen Munch, MD;  Location: [facility];  Service: Neurosurgery;  Laterality: Left;   LUMBAR LAMINECTOMY     2008, 2010, 2012   LUMBAR LAMINECTOMY/DECOMPRESSION MICRODISCECTOMY Left 05/16/2023   Procedure: LEFT L2-3 MICRODISCECTOMY;  Surgeon: Jodeen Munch, MD;  Location: [facility];  Service: Neurosurgery;  Laterality: Left;   NASAL DERMOID CYST EXCISION     NASAL SEPTUM SURGERY     PILONIDAL CYST EXCISION  1983   VIDEO BRONCHOSCOPY WITH ENDOBRONCHIAL NAVIGATION N/A 09/14/2023   Procedure: VIDEO BRONCHOSCOPY WITH ENDOBRONCHIAL NAVIGATION;  Surgeon: Erskin Hearing, MD;  Location: [facility];  Service: Thoracic;  Laterality: N/A;   VIDEO BRONCHOSCOPY WITH ENDOBRONCHIAL ULTRASOUND N/A 09/14/2023   Procedure: BRONCHOSCOPY, WITH EBUS;  Surgeon: Erskin Hearing, MD;  Location:  [facility];  Service: Thoracic;  Laterality: N/A;    FAMILY HISTORY: Family History  Problem Relation Age of Onset   Diabetes Mother     ADVANCED DIRECTIVES (Y/N):  N  HEALTH MAINTENANCE: Social History   Tobacco Use   Smoking status: Former    Current packs/day: 0.00    Average  packs/day: 1 pack/day for 42.0 years (42.0 ttl pk-yrs)    Types: Cigarettes    Start date: 11/02/1979    Quit date: 11/01/2021    Years since quitting: 1.9   Smokeless tobacco: Never  Vaping Use   Vaping status: Never Used  Substance Use Topics   Alcohol use: Not Currently   Drug use: Not Currently     Colonoscopy:  PAP:  Bone density:  Lipid panel:  Allergies  Allergen Reactions   Moxifloxacin Hives and Shortness Of Breath   Ciprofloxacin Other (See Comments)    Tendinitis   Hydrocodone Other (See Comments)    Unsteady on feet    Current Outpatient Medications  Medication Sig Dispense Refill   acetaminophen  (TYLENOL ) 500 MG tablet Take 1,000 mg by mouth every 6 (six) hours as needed.     albuterol  (VENTOLIN  HFA) 108 (90 Base) MCG/ACT inhaler Inhale 1-2 puffs into the lungs every 6 (six) hours as needed for shortness of breath or wheezing.     alfuzosin  (UROXATRAL ) 10 MG 24 hr tablet Take 10 mg by mouth every evening.     cetirizine (ZYRTEC) 10 MG tablet Take 10 mg by mouth as needed.     Cyanocobalamin (VITAMIN B-12) 1000 MCG/15ML LIQD Take 15 mLs by mouth daily.     diphenhydramine-acetaminophen  (TYLENOL  PM EXTRA STRENGTH) 25-500 MG TABS tablet Take 1 tablet by mouth at bedtime as needed.     dutasteride  (AVODART ) 0.5 MG capsule Take 0.5 mg by mouth every other day. AM-Alternates with Bactrim      guaiFENesin  (MUCINEX ) 600 MG 12 hr tablet Take 600 mg by mouth every morning.     ipratropium-albuterol  (DUONEB) 0.5-2.5 (3) MG/3ML SOLN Take 3 mLs by nebulization 3 (three) times daily.     latanoprost  (XALATAN ) 0.005 % ophthalmic solution Place 1 drop into both eyes at bedtime.     loratadine  (CLARITIN ) 10 MG tablet Take 10 mg by mouth daily as needed for allergies.     Melatonin 10 MG CAPS Take 10 mg by mouth at bedtime.     OXYGEN Inhale 2 L into the lungs at bedtime.     oxymetazoline (AFRIN) 0.05 % nasal spray Place 1 spray into both nostrils as needed for congestion.      pantoprazole  (PROTONIX ) 40 MG tablet Take 40 mg by mouth daily as needed (acid reflux).     potassium chloride  (KLOR-CON ) 8 MEQ tablet Take 8 mEq by mouth every morning.     predniSONE  (DELTASONE ) 2.5 MG tablet Take 2.5 mg by mouth every morning.     simvastatin  (ZOCOR ) 40 MG tablet Take 40 mg by mouth at bedtime.     sulfamethoxazole -trimethoprim  (BACTRIM ) 400-80 MG tablet Take 1 tablet by mouth every other day. FOR COPD-Alternates with Dutasteride      TRELEGY ELLIPTA 200-62.5-25 MCG/ACT AEPB Inhale 1 puff into the lungs every morning.     diltiazem  (CARDIZEM  CD) 180 MG 24 hr capsule Take 180 mg by mouth in the morning and at bedtime.     fluticasone  (FLONASE ) 50 MCG/ACT nasal spray Place 2 sprays into both nostrils as needed for allergies.     No current facility-administered  medications for this visit.    OBJECTIVE: Vitals:   09/26/23 1031  BP: (!) 150/58  Pulse: 68  Resp: 18     Body mass index is 23.12 kg/m.    ECOG FS:0 - Asymptomatic  General: Well-developed, well-nourished, no acute distress. Eyes: Pink conjunctiva, anicteric sclera. HEENT: Normocephalic, moist mucous membranes. Lungs: No audible wheezing or coughing. Heart: Regular rate and rhythm. Abdomen: Soft, nontender, no obvious distention. Musculoskeletal: No edema, cyanosis, or clubbing. Neuro: Alert, answering all questions appropriately. Cranial nerves grossly intact. Skin: No rashes or petechiae noted. Psych: Normal affect. Lymphatics: No cervical, calvicular, axillary or inguinal LAD.   LAB RESULTS:  Lab Results  Component Value Date   NA 140 09/13/2023   K 3.7 09/13/2023   CL 99 09/13/2023   CO2 32 09/13/2023   GLUCOSE 100 (H) 09/13/2023   BUN 21 09/13/2023   CREATININE 1.48 (H) 09/13/2023   CALCIUM  8.9 09/13/2023   PROT 6.5 10/22/2022   ALBUMIN  3.9 10/22/2022   AST 17 10/22/2022   ALT 23 10/22/2022   ALKPHOS 51 10/22/2022   BILITOT 0.4 10/22/2022   GFRNONAA 48 (L) 09/13/2023    Lab Results   Component Value Date   WBC 10.8 (H) 09/13/2023   NEUTROABS 16.5 (H) 10/22/2022   HGB 14.0 09/13/2023   HCT 42.4 09/13/2023   MCV 88.3 09/13/2023   PLT 228 09/13/2023     STUDIES: DG Chest Port 1 View Result Date: 09/14/2023 CLINICAL DATA:  [id number] Status post bronchoscopy [id number]. EXAM: PORTABLE CHEST 1 VIEW COMPARISON:  10/22/2022. FINDINGS: Bilateral lungs appear hyperexpanded and hyperlucent with coarse bronchovascular markings, in keeping with COPD. There are nonspecific opacities overlying the bilateral lungs, which are similar to the prior study and favored to represent underlying fibrosis/scarring. Bilateral lungs otherwise appear clear. No dense consolidation or lung collapse. Bilateral costophrenic angles are clear. Normal cardio-mediastinal silhouette. No acute osseous abnormalities. The soft tissues are within normal limits. IMPRESSION: No active disease. COPD. Electronically Signed   By: Beula Brunswick M.D.   On: 09/14/2023 15:34   DG C-Arm 1-60 Min-No Report Result Date: 09/14/2023 Fluoroscopy was utilized by the requesting physician.  No radiographic interpretation.   CT CHEST WO CONTRAST Result Date: 08/30/2023 CLINICAL DATA:  Lung nodule follow-up EXAM: CT CHEST WITHOUT CONTRAST TECHNIQUE: Multidetector CT imaging of the chest was performed following the standard protocol without IV contrast. RADIATION DOSE REDUCTION: This exam was performed according to the departmental dose-optimization program which includes automated exposure control, adjustment of the mA and/or kV according to patient size and/or use of iterative reconstruction technique. COMPARISON:  PET-CT February 14, 2023 CT chest Oct 18, 2022 FINDINGS: Cardiovascular: No significant vascular findings. Normal heart size. No pericardial effusion. Significant coronary artery calcifications. Mild pericardial thickening along the anterior pericardium unchanged since prior examination measuring 5 mm in thickness. Questionable  minute pericardial effusion, correlate with echocardiography as clinically needed. Mediastinum/Nodes: No enlarged mediastinal or axillary lymph nodes. Thyroid gland, trachea, and esophagus demonstrate no significant findings. Lungs/Pleura: Prominence of the interstitial markings bilaterally with mild centrilobular emphysema. Bilateral basilar chronic atelectatic changes 4.5 mm nodule left upper lobe anteriorly image 75 1.7 cm bilobed nodule of the left lower lobe stable since prior examination. 1.3 cm ground-glass appearing semi-solid nodular area within the right upper lobe image 62 unchanged since prior examination. There is no other pulmonary nodules. No infiltrates or consolidations. Upper Abdomen: Calcifications within the right kidney correlate with lithiasis without obvious hydronephrosis. Musculoskeletal: No chest wall mass or suspicious  bone lesions identified. With multilevel degenerative disc disease of the thoracic spine IMPRESSION: *Stable pulmonary nodules as described above. Lung-RADS 2, benign appearance or behavior. Continue annual screening with low-dose chest CT without contrast in 12 months. *Mild pericardial thickening along the anterior pericardium unchanged since prior examination measuring 5 mm in thickness. Questionable minute pericardial effusion, correlate with echocardiography as clinically needed. *Significant coronary artery calcifications. *Right nephrolithiasis without obvious hydronephrosis. *Multilevel degenerative disc disease of the thoracic spine. *Prominence of the interstitial markings bilaterally with mild centrilobular emphysema. *Bilateral basilar chronic atelectatic changes. *1.3 cm ground-glass appearing semi-solid nodular area within the right upper lobe unchanged since prior examination. *4.5 mm nodule left upper lobe anteriorly stable since prior examination. *1.7 cm bilobed nodule of the left lower lobe stable since prior examination. Electronically Signed   By: Fredrich Jefferson M.D.   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  He has a PET scan scheduled on Monday, Oct 01, 2023 to complete the staging workup.  He had consultation with radiation oncology earlier this morning and plan is to do SBRT over the next 2 to 3 weeks.  It appears he does not require concurrent chemotherapy, but will await PET scan results for final decision.  No further interventions are needed.  Patient will follow up with the conclusion of his XRT for further evaluation.  I spent a total of 60 minutes reviewing chart data, face-to-face evaluation with the patient, counseling and coordination of care as detailed above.   Patient expressed understanding and was in agreement with this plan. He also understands that He can call clinic at any time with any questions, concerns, or complaints.    Cancer Staging  Adenocarcinoma of right lung Surgery By [facility]) Staging form: Lung, AJCC V9 - Clinical: Stage IA2 (cT1b, cN0, cM0) - Signed by Shellie Dials, MD on 09/26/2023   Shellie Dials, MD   09/26/2023 11:32 AM

## 2023-09-26 NOTE — Progress Notes (Signed)
 Met with patient and his wife during initial consult. All questions answered during visit. Reviewed upcoming appts. Contact info given and instructed to call with any questions or needs. Pt and his wife verbalized understanding.

## 2023-10-01 ENCOUNTER — Ambulatory Visit
Admission: RE | Admit: 2023-10-01 | Discharge: 2023-10-01 | Disposition: A | Discharge status: Home / Self Care | Source: Ambulatory Visit | Attending: Oncology | Admitting: Oncology

## 2023-10-01 DIAGNOSIS — K8020 Calculus of gallbladder without cholecystitis without obstruction: Secondary | ICD-10-CM | POA: Insufficient documentation

## 2023-10-01 DIAGNOSIS — I700 Atherosclerosis of aorta: Secondary | ICD-10-CM | POA: Insufficient documentation

## 2023-10-01 DIAGNOSIS — R911 Solitary pulmonary nodule: Secondary | ICD-10-CM | POA: Diagnosis not present

## 2023-10-01 DIAGNOSIS — C3490 Malignant neoplasm of unspecified part of unspecified bronchus or lung: Secondary | ICD-10-CM | POA: Diagnosis present

## 2023-10-01 LAB — GLUCOSE, CAPILLARY: Glucose-Capillary: 88 mg/dL (ref 70–99)

## 2023-10-01 MED ORDER — FLUDEOXYGLUCOSE F - 18 (FDG) INJECTION
8.8000 | Freq: Once | INTRAVENOUS | Status: AC | PRN
  Administered 2023-10-01: 9.68 via INTRAVENOUS

## 2023-10-04 ENCOUNTER — Ambulatory Visit
Admission: RE | Admit: 2023-10-04 | Discharge: 2023-10-04 | Disposition: A | Discharge status: Home / Self Care | Source: Ambulatory Visit | Attending: Radiation Oncology | Admitting: Radiation Oncology

## 2023-10-04 ENCOUNTER — Encounter: Payer: Self-pay | Admitting: Radiation Oncology

## 2023-10-04 ENCOUNTER — Encounter: Payer: Self-pay | Admitting: *Deleted

## 2023-10-04 VITALS — BP 136/68 | HR 71 | Temp 96.9°F | Resp 16 | Ht 72.0 in | Wt 171.5 lb

## 2023-10-04 DIAGNOSIS — R918 Other nonspecific abnormal finding of lung field: Secondary | ICD-10-CM | POA: Insufficient documentation

## 2023-10-04 NOTE — Progress Notes (Signed)
 Radiation Oncology Follow up Note  Name: Bryan Mcclain   Date:   10/04/2023 MRN:  [id number] DOB: 1945-11-18    This 78 y.o. male presents to the clinic today for discussion of recent PET CT scan and patient seen initially for a probable non-small cell lung cancer of the right upper lobe.  REFERRING PROVIDER: Sari Cunning, MD  HPI: Patient is a 77 year old male who initially consulted on referral from pulmonology for 1.3 cm groundglass appearing semisolid nodule within the right upper lobe which has been slowly growing although fairly stable over the last CT scans.  Patient does have a history of COPD is a lifelong smoker.  Bronchoscopy did show atypical cells consistent with possible lung cancer although not definitive.  Lymph node stations on EBUS were normal.  I ran a PET scan which I believe would show no hypermetabolic activity because of the nature of the lesion that it is probably a low-grade adenocarcinoma.  He is seen today is doing well has had no change in his breathing pattern no cough hemoptysis or chest tightness..  COMPLICATIONS OF TREATMENT: none  FOLLOW UP COMPLIANCE: keeps appointments   PHYSICAL EXAM:  BP 136/68   Pulse 71   Temp (!) 96.9 F (36.1 C) (Tympanic)   Resp 16   Ht 6' (1.829 m)   Wt 171 lb 8 oz (77.8 kg)   BMI 23.26 kg/m  Well-developed well-nourished patient in NAD. HEENT reveals PERLA, EOMI, discs not visualized.  Oral cavity is clear. No oral mucosal lesions are identified. Neck is clear without evidence of cervical or supraclavicular adenopathy. Lungs are clear to A&P. Cardiac examination is essentially unremarkable with regular rate and rhythm without murmur rub or thrill. Abdomen is benign with no organomegaly or masses noted. Motor sensory and DTR levels are equal and symmetric in the upper and lower extremities. Cranial nerves II through XII are grossly intact. Proprioception is intact. No peripheral adenopathy or edema is identified. No motor or  sensory levels are noted. Crude visual fields are within normal range.  RADIOLOGY RESULTS: CT scan and PET scan reviewed compatible with above-stated findings  PLAN: At this time would like to wait another 3 to 4 months and track if there is progression of this lesion.  Based on the atypical cytology it being a very faint image on CT scans it would be hard to target at this size at this point in time.  In 3 to 4 months I have set him up for a follow-up with a follow-up CT scan.  Should he show any progression we will offer SBRT at that time.  Patient and wife are both comfortable with my decision making.  Will see him back as scheduled.  I would like to take this opportunity to thank you for allowing me to participate in the care of your patient.Glenis Langdon, MD

## 2023-10-17 ENCOUNTER — Inpatient Hospital Stay: Attending: Oncology | Admitting: Hospice and Palliative Medicine

## 2023-10-17 DIAGNOSIS — C3490 Malignant neoplasm of unspecified part of unspecified bronchus or lung: Secondary | ICD-10-CM

## 2023-10-17 NOTE — Progress Notes (Signed)
 Multidisciplinary Oncology Council Documentation  Bryan Mcclain was presented by our [facility] on 10/17/2023, which included representatives from:  Palliative Care Dietitian  Physical/Occupational Therapist Nurse Navigator Genetics Social work Survivorship RN Financial Navigator Research RN   Bryan Mcclain currently presents with history of lung cancer  We reviewed previous medical and familial history, history of present illness, and recent lab results along with all available histopathologic and imaging studies. The MOC considered available treatment options and made the following recommendations/referrals:  SW  The MOC is a meeting of clinicians from various specialty areas who evaluate and discuss patients for whom a multidisciplinary approach is being considered. Final determinations in the plan of care are those of the provider(s).   Today's extended care, comprehensive team conference, Bryan Mcclain was not present for the discussion and was not examined.

## 2023-10-18 ENCOUNTER — Inpatient Hospital Stay: Admitting: Licensed Clinical Social Worker

## 2023-10-18 NOTE — Progress Notes (Signed)
 [facility] Clinical Social Work  Initial Assessment   Bryan Mcclain is a 78 y.o. year old male contacted by phone. Clinical Social Work was referred by [facility] for assessment of psychosocial needs.   SDOH (Social Determinants of Health) assessments performed: reviewed from 09/26/23 SDOH Interventions    Flowsheet Row CONSULT from 09/26/2023 in [facility] [place]-Rad[facility] Oncology Pulmonary Rehab from 06/07/2022 in [facility] Cardiac and Pulmonary Rehab Pulmonary Rehab from 05/15/2022 in [facility] Cardiac and Pulmonary Rehab  SDOH Interventions     Food Insecurity Interventions Intervention Not Indicated -- --  Housing Interventions Intervention Not Indicated -- --  Transportation Interventions Intervention Not Indicated -- --  Utilities Interventions Intervention Not Indicated -- --  Depression Interventions/Treatment  -- UJW1-1 Score <4 Follow-up Not Indicated Counseling       SDOH Screenings   Food Insecurity: No Food Insecurity (09/26/2023)  Housing: Low Risk  (09/26/2023)  Transportation Needs: No Transportation Needs (09/26/2023)  Utilities: Not At Risk (09/26/2023)  Depression (PHQ2-9): Low Risk  (09/26/2023)  Financial Resource Strain: Low Risk  (09/05/2023)   Received from [facility]  Tobacco Use: Medium Risk (10/04/2023)     Distress Screen completed: No     No data to display            Family/Social Information:  Housing Arrangement: patient lives with his spouse Family members/support persons in your life? Family and Friends Transportation concerns: no  Employment: Retired.  Income source: Actor concerns: No Type of concern: None Food access concerns: no Religious or spiritual practice: Not known Advanced directives: No Services Currently in place:  [employer]  Coping/ Adjustment to diagnosis: Patient understands treatment plan and what happens next? yes Concerns about diagnosis and/or treatment: I'm not especially  worried about anything Current coping skills/ strengths: Capable of independent living , Communication skills , and Supportive family/friends     SUMMARY: Current SDOH Barriers:  No major barriers identified today  Clinical Social Work Clinical Goal(s):  No clinical social work goals at this time  Interventions: Informed patient of the support team roles and support services at [facility] [place] Encouraged patient to call with any questions or concerns and informed pt about his primary CSW [employer]   Follow Up Plan: Patient will contact CSW with any support or resource needs Patient verbalizes understanding of plan: Yes    Bryan Vassey E Anjalina Bergevin, LCSW Clinical Social Worker [facility]

## 2024-02-04 ENCOUNTER — Ambulatory Visit
Admission: RE | Admit: 2024-02-04 | Discharge: 2024-02-04 | Disposition: A | Discharge status: Home / Self Care | Source: Ambulatory Visit | Attending: Radiation Oncology | Admitting: Radiation Oncology

## 2024-02-04 DIAGNOSIS — R918 Other nonspecific abnormal finding of lung field: Secondary | ICD-10-CM | POA: Diagnosis present

## 2024-02-04 LAB — POCT I-STAT CREATININE: Creatinine, Ser: 1.4 mg/dL — ABNORMAL HIGH (ref 0.61–1.24)

## 2024-02-04 MED ORDER — IOHEXOL 300 MG/ML  SOLN
75.0000 mL | Freq: Once | INTRAMUSCULAR | Status: AC | PRN
  Administered 2024-02-04: 75 mL via INTRAVENOUS

## 2024-02-11 ENCOUNTER — Encounter: Payer: Self-pay | Admitting: Radiation Oncology

## 2024-02-11 ENCOUNTER — Ambulatory Visit
Admission: RE | Admit: 2024-02-11 | Discharge: 2024-02-11 | Disposition: A | Discharge status: Home / Self Care | Source: Ambulatory Visit | Attending: Radiation Oncology | Admitting: Radiation Oncology

## 2024-02-11 ENCOUNTER — Other Ambulatory Visit: Payer: Self-pay | Admitting: *Deleted

## 2024-02-11 ENCOUNTER — Encounter: Payer: Self-pay | Admitting: *Deleted

## 2024-02-11 VITALS — BP 136/62 | HR 72 | Temp 98.5°F | Resp 16 | Wt 162.5 lb

## 2024-02-11 DIAGNOSIS — J449 Chronic obstructive pulmonary disease, unspecified: Secondary | ICD-10-CM | POA: Insufficient documentation

## 2024-02-11 DIAGNOSIS — R918 Other nonspecific abnormal finding of lung field: Secondary | ICD-10-CM | POA: Insufficient documentation

## 2024-02-11 DIAGNOSIS — F17210 Nicotine dependence, cigarettes, uncomplicated: Secondary | ICD-10-CM | POA: Diagnosis not present

## 2024-02-11 NOTE — Progress Notes (Signed)
 Radiation Oncology Follow up Note  Name: Bryan Mcclain   Date:   02/11/2024 MRN:  [id number] DOB: 11/05/1945    This 78 y.o. male presents to the clinic today for 18-month follow-up in patient being clinically followed for a possible non-small cell lung cancer of the right upper lobe.  REFERRING PROVIDER: Cleotilde Oneil FALCON, MD  HPI: Patient is a 78 year old male never treated in our department.  We are clinically following a lesion in his right upper lobe 1.3 cm initially ground glass appearing semisolid.  Patient does have a history of COPD is a lifelong smoker..  I opted to follow this clinically with CT scans and his most recent CT scans showed on changed nodule now measuring 1.2 cm in the right upper lobe.  There is also an unchanged subpleural nodule in the periphery of the left upper lobe measuring 6 mm and a subpleural nodule within the left lung base measuring 1.6 cm.  All these are stable over time.  He does have emphysema with diffuse bronchial wall thickening.  He specifically denies cough hemoptysis or chest tightness.  COMPLICATIONS OF TREATMENT: none  FOLLOW UP COMPLIANCE: keeps appointments   PHYSICAL EXAM:  BP 136/62   Pulse 72   Temp 98.5 F (36.9 C) (Tympanic)   Resp 16   Wt 162 lb 8 oz (73.7 kg)   BMI 22.04 kg/m  Well-developed well-nourished patient in NAD. HEENT reveals PERLA, EOMI, discs not visualized.  Oral cavity is clear. No oral mucosal lesions are identified. Neck is clear without evidence of cervical or supraclavicular adenopathy. Lungs are clear to A&P. Cardiac examination is essentially unremarkable with regular rate and rhythm without murmur rub or thrill. Abdomen is benign with no organomegaly or masses noted. Motor sensory and DTR levels are equal and symmetric in the upper and lower extremities. Cranial nerves II through XII are grossly intact. Proprioception is intact. No peripheral adenopathy or edema is identified. No motor or sensory levels are noted. Crude  visual fields are within normal range.  RADIOLOGY RESULTS: Serial CT scans reviewed compatible with above-stated findings.  PLAN: Present time based on the lack of progression of disease in his chest and stable nodules we will continue to follow him and monitor him with a CT scan in 6 months.  Should that scan be again showing very little progression would probably go out 1 year for repeat CT scan and follow-up.  Patient comprehends my recommendations well.  Appointments were made.  I would like to take this opportunity to thank you for allowing me to participate in the care of your patient.SABRA Marcey Penton, MD

## 2024-07-28 ENCOUNTER — Other Ambulatory Visit

## 2024-08-11 ENCOUNTER — Ambulatory Visit: Admitting: Radiation Oncology
